# Patient Record
Sex: Male | Born: 1950 | Race: Black or African American | Hispanic: No | Smoking: Former smoker
Health system: Southern US, Community
[De-identification: ages and names within clinical notes are randomized; demographics above are authoritative.]

## PROBLEM LIST (undated history)

## (undated) DIAGNOSIS — E782 Mixed hyperlipidemia: Secondary | ICD-10-CM

## (undated) DIAGNOSIS — I10 Essential (primary) hypertension: Secondary | ICD-10-CM

## (undated) DIAGNOSIS — N529 Male erectile dysfunction, unspecified: Secondary | ICD-10-CM

## (undated) DIAGNOSIS — N183 Chronic kidney disease, stage 3 unspecified: Secondary | ICD-10-CM

## (undated) DIAGNOSIS — I2510 Atherosclerotic heart disease of native coronary artery without angina pectoris: Secondary | ICD-10-CM

## (undated) DIAGNOSIS — E559 Vitamin D deficiency, unspecified: Secondary | ICD-10-CM

## (undated) DIAGNOSIS — E538 Deficiency of other specified B group vitamins: Secondary | ICD-10-CM

## (undated) DIAGNOSIS — M1990 Unspecified osteoarthritis, unspecified site: Secondary | ICD-10-CM

## (undated) DIAGNOSIS — M109 Gout, unspecified: Secondary | ICD-10-CM

## (undated) HISTORY — DX: Atherosclerotic heart disease of native coronary artery without angina pectoris: I25.10

## (undated) HISTORY — DX: Unspecified osteoarthritis, unspecified site: M19.90

## (undated) HISTORY — DX: Chronic kidney disease, stage 3 unspecified: N18.30

## (undated) HISTORY — DX: Male erectile dysfunction, unspecified: N52.9

## (undated) HISTORY — DX: Deficiency of other specified B group vitamins: E53.8

## (undated) HISTORY — DX: Mixed hyperlipidemia: E78.2

## (undated) HISTORY — PX: ROOT CANAL: SHX2363

## (undated) HISTORY — PX: NO PAST SURGERIES: SHX2092

---

## 2005-06-27 ENCOUNTER — Ambulatory Visit: Payer: Self-pay

## 2005-07-26 ENCOUNTER — Ambulatory Visit: Payer: Self-pay

## 2006-07-22 ENCOUNTER — Ambulatory Visit: Payer: Self-pay | Admitting: Family Medicine

## 2013-06-09 ENCOUNTER — Ambulatory Visit: Payer: Self-pay

## 2013-12-15 ENCOUNTER — Ambulatory Visit: Payer: Self-pay | Admitting: Family Medicine

## 2015-01-28 ENCOUNTER — Ambulatory Visit: Payer: Self-pay | Admitting: Family Medicine

## 2015-02-15 ENCOUNTER — Ambulatory Visit
Admit: 2015-02-15 | Disposition: A | Discharge status: Home / Self Care | Payer: Self-pay | Attending: Family Medicine | Admitting: Family Medicine

## 2015-03-18 ENCOUNTER — Ambulatory Visit
Admit: 2015-03-18 | Disposition: A | Discharge status: Home / Self Care | Payer: Self-pay | Attending: Family Medicine | Admitting: Family Medicine

## 2015-05-11 ENCOUNTER — Other Ambulatory Visit: Payer: Self-pay | Admitting: Family Medicine

## 2015-05-11 ENCOUNTER — Ambulatory Visit
Admission: RE | Admit: 2015-05-11 | Discharge: 2015-05-11 | Disposition: A | Discharge status: Home / Self Care | Payer: 59 | Source: Ambulatory Visit | Attending: Family Medicine | Admitting: Family Medicine

## 2015-05-11 DIAGNOSIS — M5136 Other intervertebral disc degeneration, lumbar region: Secondary | ICD-10-CM | POA: Insufficient documentation

## 2015-05-11 DIAGNOSIS — M6281 Muscle weakness (generalized): Secondary | ICD-10-CM

## 2015-05-12 ENCOUNTER — Other Ambulatory Visit: Payer: Self-pay | Admitting: Family Medicine

## 2015-05-12 DIAGNOSIS — R270 Ataxia, unspecified: Secondary | ICD-10-CM

## 2015-05-28 ENCOUNTER — Other Ambulatory Visit: Payer: 59

## 2015-06-07 ENCOUNTER — Ambulatory Visit
Admission: EM | Admit: 2015-06-07 | Discharge: 2015-06-07 | Disposition: A | Discharge status: Home / Self Care | Payer: 59 | Attending: Emergency Medicine | Admitting: Emergency Medicine

## 2015-06-07 ENCOUNTER — Encounter: Payer: Self-pay | Admitting: Emergency Medicine

## 2015-06-07 DIAGNOSIS — M1000 Idiopathic gout, unspecified site: Secondary | ICD-10-CM

## 2015-06-07 DIAGNOSIS — M109 Gout, unspecified: Secondary | ICD-10-CM

## 2015-06-07 HISTORY — DX: Essential (primary) hypertension: I10

## 2015-06-07 HISTORY — DX: Gout, unspecified: M10.9

## 2015-06-07 MED ORDER — NAPROXEN 500 MG PO TABS: 500.0000 mg | ORAL_TABLET | Freq: Two times a day (BID) | ORAL | Status: DC

## 2015-06-07 NOTE — Discharge Instructions (Signed)

## 2015-06-07 NOTE — ED Provider Notes (Signed)
CSN: [id number]     Arrival date & time 06/07/15  6606 History   None    Chief Complaint  Patient presents with  . Toe Pain   (Consider location/radiation/quality/duration/timing/severity/associated sxs/prior Treatment) HPI  This is a 64 year old male with a history of previous gout attack who presents today with a hot tender swollen left great toe. He states that on Sunday at 2 AM he was awakened by pain A proceed mostly in his ankle but that has since settled in his MTP of his hallux. He started taking Advil but this is not stopped the attack. He admits to drinking 2 beers daily but has not been eating excessive amounts of shell fish or meat. His pain is so severe that he has a very difficult time putting on his shoes. He is not on any preventative medication.  Past Medical History  Diagnosis Date  . Hypertension   . Gout    History reviewed. No pertinent past surgical history. History reviewed. No pertinent family history. History  Substance Use Topics  . Smoking status: Former Research scientist (life sciences)  . Smokeless tobacco: Never Used  . Alcohol Use: Yes    Review of Systems  Unable to perform ROS Musculoskeletal: Positive for joint swelling and arthralgias.    Allergies  Review of patient's allergies indicates no known allergies.  Home Medications   Prior to Admission medications   Medication Sig Start Date End Date Taking? Authorizing Provider  Candesartan Cilexetil-HCTZ 32-25 MG TABS Take 1 tablet by mouth once.   Yes Historical Provider, MD  naproxen (NAPROSYN) 500 MG tablet Take 1 tablet (500 mg total) by mouth 2 (two) times daily with a meal. 06/07/15   Lorin Picket, PA-C   BP 136/65 mmHg  Pulse 82  Temp(Src) 97 F (36.1 C) (Tympanic)  Resp 16  Ht 5\' 7"  (1.702 m)  Wt 210 lb (95.255 kg)  BMI 32.88 kg/m2  SpO2 95% Physical Exam  Constitutional: He is oriented to person, place, and time. He appears well-developed and well-nourished.  HENT:  Head: Normocephalic and  atraumatic.  Eyes: EOM are normal. Pupils are equal, round, and reactive to light.  Musculoskeletal:  Examination of his left foot shows a swollen erythematous warm tender first MTP. Any pressure tends to cause his pain to increase. The ankle does not seem to be involved today. He has a fairly good range of motion of the ankle. He has pain with any motion of the first MTP.  Neurological: He is alert and oriented to person, place, and time. He has normal reflexes.  Skin: Skin is warm and dry.  Psychiatric: He has a normal mood and affect. His behavior is normal. Judgment and thought content normal.  Nursing note and vitals reviewed.   ED Course  Procedures (including critical care time) Labs Review Labs Reviewed - No data to display  Imaging Review No results found.   MDM   1. Gout of big toe    Discharge Medication List as of 06/07/2015  7:58 AM    START taking these medications   Details  naproxen (NAPROSYN) 500 MG tablet Take 1 tablet (500 mg total) by mouth 2 (two) times daily with a meal., Starting 06/07/2015, Until Discontinued, Print      Plan: 1. Diagnosis reviewed with patient 2. rx as per orders; risks, benefits, potential side effects reviewed with patient 3. Recommend supportive treatment with rest use prilosec while taking naprosyn 4. F/u prn if symptoms worsen or don't improve,with Dr. Clide Dales 5.  Continue taking naprosyn until symptom free for 2 days then D/C.    Lorin Picket, PA-C 06/07/15 [phone]

## 2015-06-07 NOTE — ED Notes (Signed)
Patient c/o pain in his left 1st toe since Sunday.

## 2015-06-09 ENCOUNTER — Ambulatory Visit (INDEPENDENT_AMBULATORY_CARE_PROVIDER_SITE_OTHER): Payer: 59 | Admitting: Family Medicine

## 2015-06-09 ENCOUNTER — Telehealth: Payer: Self-pay | Admitting: Family Medicine

## 2015-06-09 ENCOUNTER — Encounter: Payer: Self-pay | Admitting: Family Medicine

## 2015-06-09 VITALS — BP 130/82 | HR 79 | Temp 98.6°F | Resp 18 | Ht 67.0 in | Wt 210.0 lb

## 2015-06-09 DIAGNOSIS — M1000 Idiopathic gout, unspecified site: Secondary | ICD-10-CM | POA: Diagnosis not present

## 2015-06-09 MED ORDER — HYDROCODONE-ACETAMINOPHEN 10-325 MG PO TABS: 1.0000 | ORAL_TABLET | Freq: Three times a day (TID) | ORAL | Status: DC | PRN

## 2015-06-09 MED ORDER — PREDNISONE 20 MG PO TABS: 20.0000 mg | ORAL_TABLET | Freq: Every day | ORAL | Status: DC

## 2015-06-09 NOTE — Patient Instructions (Signed)
1 mo 

## 2015-06-09 NOTE — Progress Notes (Signed)
Name: Fernando Hart   MRN: [id number]    DOB: [birth year]   Date:06/09/2015       Progress Note  Subjective  Chief Complaint  Chief Complaint  Patient presents with  . Gout    Transiton into care from [facility]. Seen 06/07/2015. Was given Naproxen 500 mg twice a day    HPI  Gout   Past Medical History  Diagnosis Date  . Hypertension   . Gout     History  Substance Use Topics  . Smoking status: Former Research scientist (life sciences)  . Smokeless tobacco: Never Used  . Alcohol Use: 0.0 oz/week    0 Standard drinks or equivalent per week     Current outpatient prescriptions:  .  Candesartan Cilexetil-HCTZ 32-25 MG TABS, Take 1 tablet by mouth once., Disp: , Rfl:  .  naproxen (NAPROSYN) 500 MG tablet, Take 1 tablet (500 mg total) by mouth 2 (two) times daily with a meal., Disp: 60 tablet, Rfl: 0  No Known Allergies  Review of Systems  Constitutional: Negative for fever, chills and weight loss.  HENT: Negative for congestion, hearing loss, sore throat and tinnitus.   Eyes: Negative for blurred vision, double vision and redness.  Respiratory: Negative for cough, hemoptysis and shortness of breath.   Cardiovascular: Negative for chest pain, palpitations, orthopnea, claudication and leg swelling.  Gastrointestinal: Negative for heartburn, nausea, vomiting, diarrhea, constipation and blood in stool.  Genitourinary: Negative for dysuria, urgency, frequency and hematuria.  Musculoskeletal: Positive for joint pain (Left great toe and left ankle). Negative for myalgias, back pain, falls and neck pain.  Skin: Negative for itching.  Neurological: Negative for dizziness, tingling, tremors, focal weakness, seizures, loss of consciousness, weakness and headaches.  Endo/Heme/Allergies: Does not bruise/bleed easily.  Psychiatric/Behavioral: Negative for depression and substance abuse. The patient is not nervous/anxious and does not have insomnia.      Objective  Filed Vitals:   06/09/15 1039  BP:  130/82  Pulse: 79  Temp: 98.6 F (37 C)  TempSrc: Oral  Resp: 18  Height: 5\' 7"  (1.702 m)  Weight: 210 lb (95.255 kg)  SpO2: 96%     Physical Exam  Musculoskeletal: He exhibits edema (left great toe and left ankle) and tenderness.      Assessment & Plan  1. Idiopathic gout, unspecified chronicity, unspecified site Severe - Uric acid - predniSONE (DELTASONE) 20 MG tablet; Take 1 tablet (20 mg total) by mouth daily with breakfast.  Dispense: 10 tablet; Refill: 0 - HYDROcodone-acetaminophen (NORCO) 10-325 MG per tablet; Take 1 tablet by mouth every 8 (eight) hours as needed.  Dispense: 30 tablet; Refill: 0

## 2015-06-09 NOTE — Telephone Encounter (Signed)
Patient says his prescription is not at [facility]. Please call when this has been completed. [phone]

## 2015-06-10 NOTE — Telephone Encounter (Signed)
Script called to [facility]

## 2015-06-18 ENCOUNTER — Other Ambulatory Visit: Payer: 59

## 2015-06-27 ENCOUNTER — Other Ambulatory Visit: Payer: Self-pay | Admitting: Family Medicine

## 2015-06-30 ENCOUNTER — Encounter: Payer: Self-pay | Admitting: Family Medicine

## 2015-07-01 ENCOUNTER — Inpatient Hospital Stay
Admission: RE | Admit: 2015-07-01 | Discharge: 2015-07-01 | Disposition: A | Discharge status: Home / Self Care | Payer: 59 | Source: Ambulatory Visit | Attending: Family Medicine | Admitting: Family Medicine

## 2015-07-09 ENCOUNTER — Ambulatory Visit
Admission: RE | Admit: 2015-07-09 | Discharge: 2015-07-09 | Disposition: A | Discharge status: Home / Self Care | Payer: 59 | Source: Ambulatory Visit | Attending: Family Medicine | Admitting: Family Medicine

## 2015-07-09 DIAGNOSIS — R270 Ataxia, unspecified: Secondary | ICD-10-CM

## 2015-07-09 MED ORDER — GADOBENATE DIMEGLUMINE 529 MG/ML IV SOLN
20.0000 mL | Freq: Once | INTRAVENOUS | Status: AC | PRN
  Administered 2015-07-09: 20 mL via INTRAVENOUS

## 2015-07-11 ENCOUNTER — Encounter: Payer: Self-pay | Admitting: Family Medicine

## 2015-07-12 ENCOUNTER — Ambulatory Visit: Payer: 59 | Admitting: Family Medicine

## 2015-07-19 ENCOUNTER — Ambulatory Visit (INDEPENDENT_AMBULATORY_CARE_PROVIDER_SITE_OTHER): Payer: 59 | Admitting: Family Medicine

## 2015-07-19 ENCOUNTER — Encounter: Payer: Self-pay | Admitting: Family Medicine

## 2015-07-19 VITALS — BP 140/80 | HR 91 | Temp 98.0°F | Resp 18 | Ht 67.0 in | Wt 211.6 lb

## 2015-07-19 DIAGNOSIS — G4733 Obstructive sleep apnea (adult) (pediatric): Secondary | ICD-10-CM | POA: Diagnosis not present

## 2015-07-19 DIAGNOSIS — R739 Hyperglycemia, unspecified: Secondary | ICD-10-CM | POA: Diagnosis not present

## 2015-07-19 DIAGNOSIS — M109 Gout, unspecified: Secondary | ICD-10-CM | POA: Diagnosis not present

## 2015-07-19 LAB — POCT GLYCOSYLATED HEMOGLOBIN (HGB A1C): Hemoglobin A1C: 6

## 2015-07-19 LAB — GLUCOSE, POCT (MANUAL RESULT ENTRY): POC GLUCOSE: 115 mg/dL — AB (ref 70–99)

## 2015-07-19 NOTE — Patient Instructions (Signed)

## 2015-07-19 NOTE — Progress Notes (Signed)
Name: Fernando Hart   MRN: [id number]    DOB: 19-Nov-1951   Date:07/19/2015       Progress Note  Subjective  Chief Complaint  Chief Complaint  Patient presents with  . Gout    follow up, symptoms resolved    HPI  Gout  Patient states it took about for 5 days before his gout cleared with medication. He's had no recurrence since that. He has not had frequent recurrences over the years.  Ataxia  Patient's been seen by neurologist. Extensive workup including MRI been done. MRI shows age-related  ischemic changes. He has not had a recurrence of any significance. He's been informed that he need not return to neurologist Alyssa has recurrent further workup included MRI of the 6 spine will be performed at bedtime  Hyperlipidemia  Patient has a history of elevated glucose in the past. Hemoglobin A1c has been as high as 6.2. He is currently not complaining of any polyuria polydipsia polyphagia. Next problem probable sleep apnea subjective patient is had a sleep study done in the past and was diagnosed with sleep apnea but has refused CPAP.  Past Medical History  Diagnosis Date  . Hypertension   . Gout     History  Substance Use Topics  . Smoking status: Former Research scientist (life sciences)  . Smokeless tobacco: Never Used  . Alcohol Use: 0.0 oz/week    0 Standard drinks or equivalent per week     Current outpatient prescriptions:  .  Candesartan Cilexetil-HCTZ 32-25 MG TABS, Take 1 tablet by mouth  daily, Disp: 90 each, Rfl: 1 .  HYDROcodone-acetaminophen (NORCO) 10-325 MG per tablet, Take 1 tablet by mouth every 8 (eight) hours as needed., Disp: 30 tablet, Rfl: 0 .  naproxen (NAPROSYN) 500 MG tablet, Take 1 tablet (500 mg total) by mouth 2 (two) times daily with a meal., Disp: 60 tablet, Rfl: 0 .  predniSONE (DELTASONE) 20 MG tablet, Take 1 tablet (20 mg total) by mouth daily with breakfast., Disp: 10 tablet, Rfl: 0  No Known Allergies  Review of Systems  Constitutional: Negative for fever, chills  and weight loss.  HENT: Negative for congestion, hearing loss, sore throat and tinnitus.   Eyes: Negative for blurred vision, double vision and redness.  Respiratory: Negative for cough, hemoptysis and shortness of breath.   Cardiovascular: Negative for chest pain, palpitations, orthopnea, claudication and leg swelling.  Gastrointestinal: Negative for heartburn, nausea, vomiting, diarrhea, constipation and blood in stool.  Genitourinary: Negative for dysuria, urgency, frequency and hematuria.  Musculoskeletal: Positive for joint pain. Negative for myalgias, back pain, falls and neck pain.  Skin: Negative for itching.  Neurological: Negative for dizziness, tingling, tremors, focal weakness, seizures, loss of consciousness, weakness and headaches.       Ataxia and occasional falls  Endo/Heme/Allergies: Does not bruise/bleed easily.  Psychiatric/Behavioral: Negative for depression and substance abuse. The patient is not nervous/anxious and does not have insomnia.      Objective  Filed Vitals:   07/19/15 0827  BP: 140/80  Pulse: 91  Temp: 98 F (36.7 C)  TempSrc: Oral  Resp: 18  Height: 5\' 7"  (1.702 m)  Weight: 211 lb 9.6 oz (95.981 kg)  SpO2: 95%     Physical Exam    Assessment & Plan  1. Hyperglycemia  - POCT HgB A1C - POCT Glucose (CBG)  2. OSA (obstructive sleep apnea) CPAP  3. Gout of right foot, unspecified cause, unspecified chronicity  - Uric acid TO DETERMINE IF URICOSURIC IS NEEDED

## 2015-08-06 LAB — URIC ACID: Uric Acid: 9 mg/dL — ABNORMAL HIGH (ref 3.7–8.6)

## 2015-08-10 ENCOUNTER — Telehealth: Payer: Self-pay | Admitting: Emergency Medicine

## 2015-08-10 MED ORDER — ALLOPURINOL 100 MG PO TABS: 100.0000 mg | ORAL_TABLET | Freq: Every day | ORAL | Status: DC

## 2015-08-10 NOTE — Telephone Encounter (Signed)
Letter with script mailed to patient. Unable to reach by phone at either number on file.

## 2015-09-08 ENCOUNTER — Encounter: Payer: Self-pay | Admitting: Family Medicine

## 2015-10-05 ENCOUNTER — Ambulatory Visit (INDEPENDENT_AMBULATORY_CARE_PROVIDER_SITE_OTHER): Payer: 59 | Admitting: Family Medicine

## 2015-10-05 ENCOUNTER — Encounter: Payer: Self-pay | Admitting: Family Medicine

## 2015-10-05 VITALS — BP 122/76 | HR 96 | Temp 98.5°F | Resp 16 | Ht 67.0 in | Wt 210.1 lb

## 2015-10-05 DIAGNOSIS — Z0000 Encounter for general adult medical examination without abnormal findings: Secondary | ICD-10-CM

## 2015-10-05 DIAGNOSIS — Z1211 Encounter for screening for malignant neoplasm of colon: Secondary | ICD-10-CM | POA: Diagnosis not present

## 2015-10-05 DIAGNOSIS — Z125 Encounter for screening for malignant neoplasm of prostate: Secondary | ICD-10-CM | POA: Diagnosis not present

## 2015-10-05 NOTE — Progress Notes (Signed)
Name: Fernando Hart   MRN: [id number]    DOB: 06-21-1951   Date:10/05/2015       Progress Note  Subjective This is membranes B lymphoblastic    Chief Complaint  Chief Complaint  Patient presents with  . Annual Exam    HPI  Patient presents for annual H&P. His baseline medical problems are stable.    Past Medical History  Diagnosis Date  . Hypertension   . Gout     Social History  Substance Use Topics  . Smoking status: Former Research scientist (life sciences)  . Smokeless tobacco: Former Systems developer    Quit date: 05/06/2015  . Alcohol Use: 0.0 oz/week    0 Standard drinks or equivalent per week     Current outpatient prescriptions:  .  allopurinol (ZYLOPRIM) 100 MG tablet, Take 1 tablet (100 mg total) by mouth daily., Disp: 60 tablet, Rfl: 5 .  Candesartan Cilexetil-HCTZ 32-25 MG TABS, Take 1 tablet by mouth  daily, Disp: 90 each, Rfl: 1 .  HYDROcodone-acetaminophen (NORCO) 10-325 MG per tablet, Take 1 tablet by mouth every 8 (eight) hours as needed., Disp: 30 tablet, Rfl: 0 .  naproxen (NAPROSYN) 500 MG tablet, Take 1 tablet (500 mg total) by mouth 2 (two) times daily with a meal., Disp: 60 tablet, Rfl: 0 .  predniSONE (DELTASONE) 20 MG tablet, Take 1 tablet (20 mg total) by mouth daily with breakfast. (Patient not taking: Reported on 10/05/2015), Disp: 10 tablet, Rfl: 0  No Known Allergies  Review of Systems  Constitutional: Negative for fever, chills and weight loss.       Obese male in no acute distress  HENT: Negative for congestion, hearing loss, sore throat and tinnitus.   Eyes: Negative for blurred vision, double vision and redness.  Respiratory: Negative for cough, hemoptysis and shortness of breath.   Cardiovascular: Negative for chest pain, palpitations, orthopnea, claudication and leg swelling.  Gastrointestinal: Negative for heartburn, nausea, vomiting, diarrhea, constipation and blood in stool.  Genitourinary: Negative for dysuria, urgency, frequency and hematuria.   Musculoskeletal: Negative for myalgias, back pain, joint pain, falls and neck pain.  Skin: Negative for itching.  Neurological: Negative for dizziness, tingling, tremors, focal weakness, seizures, loss of consciousness, weakness and headaches.  Endo/Heme/Allergies: Does not bruise/bleed easily.  Psychiatric/Behavioral: Negative for depression and substance abuse. The patient is not nervous/anxious and does not have insomnia.      Objective  Filed Vitals:   10/05/15 0933  BP: 122/76  Pulse: 96  Temp: 98.5 F (36.9 C)  Resp: 16  Height: 5\' 7"  (1.702 m)  Weight: 210 lb 1 oz (95.284 kg)  SpO2: 98%     Physical Exam  Constitutional: He is oriented to person, place, and time and well-developed, well-nourished, and in no distress.  HENT:  Head: Normocephalic.  Eyes: EOM are normal. Pupils are equal, round, and reactive to light.  Neck: Normal range of motion. Neck supple. No thyromegaly present.  Cardiovascular: Normal rate, regular rhythm and normal heart sounds.   No murmur heard. Pulmonary/Chest: Effort normal and breath sounds normal. No respiratory distress. He has no wheezes.  Abdominal: Soft. Bowel sounds are normal.  Genitourinary: Rectum normal, prostate normal and penis normal. Guaiac negative stool. No discharge found.  Musculoskeletal: Normal range of motion. He exhibits no edema.  Lymphadenopathy:    He has no cervical adenopathy.  Neurological: He is alert and oriented to person, place, and time. No cranial nerve deficit. Gait normal. Coordination normal.  Skin: Skin is warm and dry.  No rash noted.  Psychiatric: Affect and judgment normal.      Assessment & Plan   1. Annual physical exam CBC CMP and PSA and lipid panel  2. Colon cancer screening Negative morning - POC Hemoccult Bld/Stl (1-Cd Office Dx)    Asked to see because symptoms of her symptoms 6 to these should she reports receptors were still having trouble about her fall

## 2015-10-05 NOTE — Patient Instructions (Signed)
Acanthosis Nigricans Acanthosis nigricans is a disorder in which dark, velvety markings appear on the skin. CAUSES This condition may be caused by:  A hormonal or glandular disorder, such as diabetes.  Obesity.  Certain medicines, such as birth control pills.  A tumor. (This is rare.) Some people inherit the condition from their parents. RISK FACTORS This condition is more likely to develop in:  People who have a hormonal or glandular disorder.  People who are overweight.  People who take certain medicines.  People who have certain cancers, especially stomach cancer.  People who have dark-colored skin (dark complexion). SYMPTOMS The main symptom of this condition is velvety markings on the skin that are light brown, black, or grayish in color. The markings usually appear on the face, neck, armpits, inner thighs, and groin. In severe cases, markings may also appear on the lips, hands, breasts, eyelids, and mouth. DIAGNOSIS This condition may be diagnosed based on symptoms. Sometimes, a skin sample is taken for testing (skin biopsy). You may also have tests to help determine the cause of the condition. TREATMENT Treatment for this condition depends on the cause. Treatment may involve reducing insulin levels, which are often high in people who have this condition. Insulin levels can be reduced with:  Dietary changes, such as avoiding starchy foods and sugars.  Losing weight.  Medicines. Sometimes, treatment involves:  Medicines to improve the appearance of the skin.  Laser treatment to improve the appearance of the skin.  Surgical removal of the skin markings (dermabrasion). HOME CARE INSTRUCTIONS  Follow diet instructions from your health care provider.  Lose weight if you are overweight.  Take over-the-counter and prescription medicines only as told by your health care provider.  Keep all follow-up visits as told by your health care provider. This is  important. SEEK MEDICAL CARE IF:  The skin markings do not go away with treatment.  New skin markings develop on a part of the body where they rarely develop, such as on your lips, hands, breasts, eyelids, or mouth.  The condition recurs for an unknown reason.   This information is not intended to replace advice given to you by your health care provider. Make sure you discuss any questions you have with your health care provider.   Document Released: 12/03/2005 Document Revised: 08/24/2015 Document Reviewed: 01/27/2015 Elsevier Interactive Patient Education [employer].

## 2015-12-17 ENCOUNTER — Other Ambulatory Visit: Payer: Self-pay | Admitting: Family Medicine

## 2016-01-07 LAB — COMPREHENSIVE METABOLIC PANEL
ALT: 28 IU/L (ref 0–44)
AST: 32 IU/L (ref 0–40)
Albumin/Globulin Ratio: 1.5 (ref 1.1–2.5)
Albumin: 4.3 g/dL (ref 3.6–4.8)
Alkaline Phosphatase: 63 IU/L (ref 39–117)
BUN/Creatinine Ratio: 13 (ref 10–22)
BUN: 16 mg/dL (ref 8–27)
Bilirubin Total: 0.4 mg/dL (ref 0.0–1.2)
CALCIUM: 9.8 mg/dL (ref 8.6–10.2)
CHLORIDE: 97 mmol/L (ref 96–106)
CO2: 25 mmol/L (ref 18–29)
CREATININE: 1.24 mg/dL (ref 0.76–1.27)
GFR calc Af Amer: 70 mL/min/{1.73_m2} (ref >59)
GFR calc non Af Amer: 61 mL/min/{1.73_m2} (ref >59)
GLUCOSE: 100 mg/dL — AB (ref 65–99)
Globulin, Total: 2.8 g/dL (ref 1.5–4.5)
Potassium: 3.9 mmol/L (ref 3.5–5.2)
Sodium: 138 mmol/L (ref 134–144)
Total Protein: 7.1 g/dL (ref 6.0–8.5)

## 2016-01-07 LAB — LIPID PANEL
Chol/HDL Ratio: 5.3 ratio units — ABNORMAL HIGH (ref 0.0–5.0)
Cholesterol, Total: 153 mg/dL (ref 100–199)
HDL: 29 mg/dL — ABNORMAL LOW (ref >39)
LDL CALC: 96 mg/dL (ref 0–99)
Triglycerides: 140 mg/dL (ref 0–149)
VLDL CHOLESTEROL CAL: 28 mg/dL (ref 5–40)

## 2016-01-07 LAB — CBC
Hematocrit: 47.6 % (ref 37.5–51.0)
Hemoglobin: 16.3 g/dL (ref 12.6–17.7)
MCH: 28.8 pg (ref 26.6–33.0)
MCHC: 34.2 g/dL (ref 31.5–35.7)
MCV: 84 fL (ref 79–97)
Platelets: 194 10*3/uL (ref 150–379)
RBC: 5.65 x10E6/uL (ref 4.14–5.80)
RDW: 13.6 % (ref 12.3–15.4)
WBC: 5.8 10*3/uL (ref 3.4–10.8)

## 2016-01-07 LAB — PSA: PROSTATE SPECIFIC AG, SERUM: 1 ng/mL (ref 0.0–4.0)

## 2016-01-07 LAB — TSH: TSH: 1.92 u[IU]/mL (ref 0.450–4.500)

## 2016-01-09 ENCOUNTER — Telehealth: Payer: Self-pay | Admitting: Emergency Medicine

## 2016-01-09 NOTE — Telephone Encounter (Signed)
Letter sent to patient regarding labs

## 2016-01-10 ENCOUNTER — Ambulatory Visit (INDEPENDENT_AMBULATORY_CARE_PROVIDER_SITE_OTHER): Payer: 59 | Admitting: Family Medicine

## 2016-01-10 ENCOUNTER — Encounter: Payer: Self-pay | Admitting: Family Medicine

## 2016-01-10 VITALS — BP 132/62 | HR 86 | Temp 98.5°F | Resp 18 | Ht 67.0 in | Wt 212.4 lb

## 2016-01-10 DIAGNOSIS — I10 Essential (primary) hypertension: Secondary | ICD-10-CM

## 2016-01-10 DIAGNOSIS — M109 Gout, unspecified: Secondary | ICD-10-CM | POA: Insufficient documentation

## 2016-01-10 DIAGNOSIS — R7303 Prediabetes: Secondary | ICD-10-CM | POA: Insufficient documentation

## 2016-01-10 DIAGNOSIS — R739 Hyperglycemia, unspecified: Secondary | ICD-10-CM

## 2016-01-10 DIAGNOSIS — M1000 Idiopathic gout, unspecified site: Secondary | ICD-10-CM

## 2016-01-10 LAB — GLUCOSE, POCT (MANUAL RESULT ENTRY): POC Glucose: 106 mg/dl — AB (ref 70–99)

## 2016-01-10 LAB — POCT GLYCOSYLATED HEMOGLOBIN (HGB A1C): Hemoglobin A1C: 6.1

## 2016-01-10 MED ORDER — TRIAMCINOLONE 0.1 % CREAM:EUCERIN CREAM 1:1: 1.0000 "application " | TOPICAL_CREAM | Freq: Three times a day (TID) | CUTANEOUS | Status: DC

## 2016-01-10 NOTE — Progress Notes (Signed)
Name: Fernando Hart   MRN: [id number]    DOB: August 25, 1951   Date:01/10/2016       Progress Note  Subjective  Chief Complaint  Chief Complaint  Patient presents with  . Hypertension    pt here for 3 month follow up  . Gout    HPI  Hypertension   Patient presents for follow-up of hypertension. It has been present for over 5 and losartan years.  Patient states that there is compliance with medical regimen which consists of  . thank you candesartan HCTZ 32-25 once daily.There is no end organ disease. Cardiac risk factors include hypertension hyperlipidemia and diabetes.  Exercise regimen consist of take anything this morning his pain is adequate pain medicines as last night so regular cardiology .  Diet consist of low-sodium .  Gout history of present illness  Patient has a long-standing history of gout. He is currently on allopurinol 100 mg, daily. Is taking Naprosyn and HAS FOR FLARES OF HIS GOUT.  Hyperglycemia  Patient has a history of hyperglycemia with last A1c being 6.0 August 16 glucose being 1:15. A complete complaint of any polyuria polydipsia polyphagia. Past Medical History  Diagnosis Date  . Hypertension   . Gout     Social History  Substance Use Topics  . Smoking status: Former Research scientist (life sciences)  . Smokeless tobacco: Former Systems developer    Quit date: 05/06/2015  . Alcohol Use: 0.0 oz/week    0 Standard drinks or equivalent per week     Current outpatient prescriptions:  .  allopurinol (ZYLOPRIM) 100 MG tablet, Take 1 tablet (100 mg total) by mouth daily., Disp: 60 tablet, Rfl: 5 .  Candesartan Cilexetil-HCTZ 32-25 MG TABS, Take 1 tablet by mouth  daily, Disp: 90 each, Rfl: 1 .  HYDROcodone-acetaminophen (NORCO) 10-325 MG per tablet, Take 1 tablet by mouth every 8 (eight) hours as needed., Disp: 30 tablet, Rfl: 0 .  naproxen (NAPROSYN) 500 MG tablet, Take 1 tablet (500 mg total) by mouth 2 (two) times daily with a meal., Disp: 60 tablet, Rfl: 0 .  predniSONE (DELTASONE) 20 MG  tablet, Take 1 tablet (20 mg total) by mouth daily with breakfast. (Patient not taking: Reported on 10/05/2015), Disp: 10 tablet, Rfl: 0  No Known Allergies  Review of Systems  Constitutional: Negative for fever, chills and weight loss.  HENT: Negative for congestion, hearing loss, sore throat and tinnitus.   Eyes: Negative for blurred vision, double vision and redness.  Respiratory: Negative for cough, hemoptysis and shortness of breath.   Cardiovascular: Negative for chest pain, palpitations, orthopnea, claudication and leg swelling.  Gastrointestinal: Negative for heartburn, nausea, vomiting, diarrhea, constipation and blood in stool.  Genitourinary: Negative for dysuria, urgency, frequency and hematuria.  Musculoskeletal: Positive for joint pain. Negative for myalgias, back pain, falls and neck pain.  Skin: Negative for itching.  Neurological: Negative for dizziness, tingling, tremors, focal weakness, seizures, loss of consciousness, weakness and headaches.  Endo/Heme/Allergies: Does not bruise/bleed easily.  Psychiatric/Behavioral: Negative for depression and substance abuse. The patient is not nervous/anxious and does not have insomnia.      Objective  Filed Vitals:   01/10/16 0756  BP: 132/62  Pulse: 86  Temp: 98.5 F (36.9 C)  Resp: 18  Height: 5\' 7"  (1.702 m)  Weight: 212 lb 7 oz (96.361 kg)  SpO2: 97%     Physical Exam  Constitutional: He is oriented to person, place, and time and well-developed, well-nourished, and in no distress.  HENT:  Head: Normocephalic.  Eyes: EOM are normal. Pupils are equal, round, and reactive to light.  Neck: Normal range of motion. Neck supple. No thyromegaly present.  Cardiovascular: Normal rate, regular rhythm and normal heart sounds.   No murmur heard. Pulmonary/Chest: Effort normal and breath sounds normal. No respiratory distress. He has no wheezes.  Abdominal: Soft. Bowel sounds are normal.  Musculoskeletal: Normal range of  motion. He exhibits no edema.  Lymphadenopathy:    He has no cervical adenopathy.  Neurological: He is alert and oriented to person, place, and time. No cranial nerve deficit. Gait normal. Coordination normal.  Skin: Skin is warm and dry. No rash noted.  Psychiatric: Affect and judgment normal.      Assessment & Plan   1. Hyperglycemia Slightly worse with A1c of 6.1 encourage low-carb diet with lean protein and exercise regimen increase and work on weight decrease with exercise - POCT HgB A1C - POCT Glucose (CBG)  2. Essential hypertension Well-controlled  3. Idiopathic gout, unspecified chronicity, unspecified site Uric acid level

## 2016-01-12 ENCOUNTER — Telehealth: Payer: Self-pay | Admitting: Emergency Medicine

## 2016-01-12 ENCOUNTER — Telehealth: Payer: Self-pay | Admitting: Family Medicine

## 2016-01-12 NOTE — Telephone Encounter (Signed)
Left message for patient regarding lab results. Patient is to call back and confirm he received message and where to call refills into for increase on Allopurinol

## 2016-01-12 NOTE — Telephone Encounter (Signed)
Pt states you can send his RX to [facility]

## 2016-01-12 NOTE — Telephone Encounter (Signed)
Duplicate message. 

## 2016-01-13 ENCOUNTER — Other Ambulatory Visit: Payer: Self-pay | Admitting: Family Medicine

## 2016-01-13 MED ORDER — ALLOPURINOL 100 MG PO TABS: 100.0000 mg | ORAL_TABLET | Freq: Two times a day (BID) | ORAL | Status: DC

## 2016-01-17 LAB — URIC ACID: Uric Acid: 8.2 mg/dL (ref 3.7–8.6)

## 2016-01-17 LAB — SPECIMEN STATUS REPORT

## 2016-02-28 ENCOUNTER — Encounter: Payer: Self-pay | Admitting: Family Medicine

## 2016-02-28 ENCOUNTER — Ambulatory Visit (INDEPENDENT_AMBULATORY_CARE_PROVIDER_SITE_OTHER): Payer: 59 | Admitting: Family Medicine

## 2016-02-28 VITALS — BP 130/60 | HR 90 | Temp 97.7°F | Resp 17 | Ht 67.0 in | Wt 215.9 lb

## 2016-02-28 DIAGNOSIS — M1611 Unilateral primary osteoarthritis, right hip: Secondary | ICD-10-CM | POA: Insufficient documentation

## 2016-02-28 DIAGNOSIS — G8929 Other chronic pain: Secondary | ICD-10-CM

## 2016-02-28 DIAGNOSIS — M25551 Pain in right hip: Secondary | ICD-10-CM | POA: Diagnosis not present

## 2016-02-28 NOTE — Progress Notes (Signed)
Name: Fernando Hart   MRN: [id number]    DOB: 01/01/1951   Date:02/28/2016       Progress Note  Subjective  Chief Complaint  Chief Complaint  Patient presents with  . Acute Visit    Hip Pain    HPI  Right Hip Pain: Onset 6 months ago, progressively worse, no injury. Some movements make it worse (especially when he was exiting out of his car last week, had sudden sharp pain, to the point he almost fell and had to hold onto the door).  Past Medical History  Diagnosis Date  . Hypertension   . Gout     Past Surgical History  Procedure Laterality Date  . No past surgeries      Family History  Problem Relation Age of Onset  . Hypertension Mother   . Diabetes Father     Social History   Social History  . Marital Status: Single    Spouse Name: N/A  . Number of Children: N/A  . Years of Education: N/A   Occupational History  . Not on file.   Social History Main Topics  . Smoking status: Former Research scientist (life sciences)  . Smokeless tobacco: Former Systems developer    Quit date: 05/06/2015  . Alcohol Use: 0.0 oz/week    0 Standard drinks or equivalent per week  . Drug Use: No  . Sexual Activity:    Partners: Female   Other Topics Concern  . Not on file   Social History Narrative     Current outpatient prescriptions:  .  allopurinol (ZYLOPRIM) 100 MG tablet, Take 1 tablet (100 mg total) by mouth 2 (two) times daily., Disp: 180 tablet, Rfl: 1 .  Candesartan Cilexetil-HCTZ 32-25 MG TABS, Take 1 tablet by mouth  daily, Disp: 90 each, Rfl: 1 .  Triamcinolone Acetonide (TRIAMCINOLONE 0.1 % CREAM : EUCERIN) CREA, Apply 1 application topically 3 (three) times daily., Disp: 1 each, Rfl: 2  No Known Allergies   Review of Systems  Constitutional: Negative for fever and chills.  Musculoskeletal: Positive for joint pain. Negative for back pain.      Objective  Filed Vitals:   02/28/16 1531  BP: 130/60  Pulse: 90  Temp: 97.7 F (36.5 C)  TempSrc: Oral  Resp: 17  Height: 5\' 7"  (1.702 m)   Weight: 215 lb 14.4 oz (97.932 kg)  SpO2: 96%    Physical Exam  Musculoskeletal:       Right hip: He exhibits tenderness.  Tenderness to palpation over the right lateral hip (trochanteric bursa), no pain in the groin or the gluteal area. Muscle atrophy over the R. lateral hip area.  Nursing note and vitals reviewed.    Assessment & Plan  1. Chronic pain of right hip Persistent and slowly progressive. Suspect osteoarthritis. We'll obtain x-rays for evaluation. - DG HIP UNILAT WITH PELVIS 2-3 VIEWS RIGHT; Future   Netha Dafoe [facility] 02/28/2016 3:57 PM

## 2016-03-01 ENCOUNTER — Ambulatory Visit
Admission: RE | Admit: 2016-03-01 | Discharge: 2016-03-01 | Disposition: A | Discharge status: Home / Self Care | Payer: 59 | Source: Ambulatory Visit | Attending: Family Medicine | Admitting: Family Medicine

## 2016-03-01 DIAGNOSIS — M25551 Pain in right hip: Principal | ICD-10-CM

## 2016-03-01 DIAGNOSIS — G8929 Other chronic pain: Secondary | ICD-10-CM

## 2016-03-01 DIAGNOSIS — M160 Bilateral primary osteoarthritis of hip: Secondary | ICD-10-CM | POA: Insufficient documentation

## 2016-03-07 ENCOUNTER — Ambulatory Visit (INDEPENDENT_AMBULATORY_CARE_PROVIDER_SITE_OTHER): Payer: 59 | Admitting: Family Medicine

## 2016-03-07 ENCOUNTER — Encounter: Payer: Self-pay | Admitting: Family Medicine

## 2016-03-07 VITALS — BP 120/68 | HR 76 | Temp 98.3°F | Resp 18 | Ht 67.0 in | Wt 213.1 lb

## 2016-03-07 DIAGNOSIS — M1611 Unilateral primary osteoarthritis, right hip: Secondary | ICD-10-CM | POA: Diagnosis not present

## 2016-03-07 MED ORDER — MELOXICAM 15 MG PO TABS: 15.0000 mg | ORAL_TABLET | Freq: Every day | ORAL | Status: DC

## 2016-03-07 NOTE — Progress Notes (Signed)
Name: Fernando Hart   MRN: [id number]    DOB: 08-14-1951   Date:03/07/2016       Progress Note  Subjective  Chief Complaint  Chief Complaint  Patient presents with  . Hip Pain    follow up    HPI  Right Hip Pain: Progressively worsening right hip pain, X ray of right hip showed moderate degenerative changes, superior acetabular spurring.  Pt. Has been taking Aleve OTC BID, which helps relieve the pain to some extent. He is able to walk, which also helps relieve his pain.  Past Medical History  Diagnosis Date  . Hypertension   . Gout     Past Surgical History  Procedure Laterality Date  . No past surgeries      Family History  Problem Relation Age of Onset  . Hypertension Mother   . Diabetes Father     Social History   Social History  . Marital Status: Single    Spouse Name: N/A  . Number of Children: N/A  . Years of Education: N/A   Occupational History  . Not on file.   Social History Main Topics  . Smoking status: Former Research scientist (life sciences)  . Smokeless tobacco: Former Systems developer    Quit date: 05/06/2015  . Alcohol Use: 0.0 oz/week    0 Standard drinks or equivalent per week  . Drug Use: No  . Sexual Activity:    Partners: Female   Other Topics Concern  . Not on file   Social History Narrative     Current outpatient prescriptions:  .  allopurinol (ZYLOPRIM) 100 MG tablet, Take 1 tablet (100 mg total) by mouth 2 (two) times daily., Disp: 180 tablet, Rfl: 1 .  Candesartan Cilexetil-HCTZ 32-25 MG TABS, Take 1 tablet by mouth  daily, Disp: 90 each, Rfl: 1 .  Triamcinolone Acetonide (TRIAMCINOLONE 0.1 % CREAM : EUCERIN) CREA, Apply 1 application topically 3 (three) times daily., Disp: 1 each, Rfl: 2  No Known Allergies   Review of Systems  Musculoskeletal: Positive for joint pain.     Objective  Filed Vitals:   03/07/16 1027  BP: 120/68  Pulse: 76  Temp: 98.3 F (36.8 C)  TempSrc: Oral  Resp: 18  Height: 5\' 7"  (1.702 m)  Weight: 213 lb 1.6 oz (96.662  kg)  SpO2: 95%    Physical Exam  Constitutional: He is well-developed, well-nourished, and in no distress.  Cardiovascular: Normal rate and regular rhythm.   Pulmonary/Chest: Effort normal and breath sounds normal.  Musculoskeletal:       Right hip: He exhibits tenderness. He exhibits no swelling.       Legs: Tenderness to palpation over the lateral right hip, no tenderness in the groin area. No swelling in the trochanteric bursa.  Nursing note and vitals reviewed.   Assessment & Plan  1. Primary osteoarthritis of right hip We will start on meloxicam 15 mg daily for osteoarthritis. Encouraged to stay active. Follow-up in one month. - meloxicam (MOBIC) 15 MG tablet; Take 1 tablet (15 mg total) by mouth daily.  Dispense: 30 tablet; Refill: 0   Teagen Mcleary [facility] 03/07/2016 10:33 AM

## 2016-03-30 ENCOUNTER — Ambulatory Visit: Payer: 59 | Admitting: Family Medicine

## 2016-04-02 ENCOUNTER — Other Ambulatory Visit: Payer: Self-pay | Admitting: Family Medicine

## 2016-04-05 ENCOUNTER — Ambulatory Visit: Payer: 59 | Admitting: Family Medicine

## 2016-05-22 ENCOUNTER — Other Ambulatory Visit: Payer: Self-pay | Admitting: Family Medicine

## 2016-07-10 ENCOUNTER — Ambulatory Visit: Payer: 59 | Admitting: Family Medicine

## 2016-07-11 ENCOUNTER — Ambulatory Visit (INDEPENDENT_AMBULATORY_CARE_PROVIDER_SITE_OTHER): Payer: 59 | Admitting: Family Medicine

## 2016-07-11 ENCOUNTER — Encounter: Payer: Self-pay | Admitting: Family Medicine

## 2016-07-11 VITALS — BP 120/70 | HR 96 | Temp 98.8°F | Resp 18 | Ht 67.0 in | Wt 211.3 lb

## 2016-07-11 DIAGNOSIS — I10 Essential (primary) hypertension: Secondary | ICD-10-CM | POA: Diagnosis not present

## 2016-07-11 DIAGNOSIS — M1611 Unilateral primary osteoarthritis, right hip: Secondary | ICD-10-CM

## 2016-07-11 DIAGNOSIS — M79604 Pain in right leg: Secondary | ICD-10-CM | POA: Diagnosis not present

## 2016-07-11 MED ORDER — MELOXICAM 15 MG PO TABS: 15.0000 mg | ORAL_TABLET | Freq: Every day | ORAL | 0 refills | Status: DC

## 2016-07-11 MED ORDER — CANDESARTAN CILEXETIL-HCTZ 32-25 MG PO TABS: 1.0000 | ORAL_TABLET | Freq: Every day | ORAL | 1 refills | Status: DC

## 2016-07-11 MED ORDER — TIZANIDINE HCL 4 MG PO CAPS: 4.0000 mg | ORAL_CAPSULE | Freq: Every evening | ORAL | 0 refills | Status: DC | PRN

## 2016-07-11 NOTE — Progress Notes (Signed)
Name: Fernando Hart   MRN: [id number]    DOB: 15-May-1951   Date:07/11/2016       Progress Note  Subjective  Chief Complaint  Chief Complaint  Patient presents with  . Hypertension    medication refills  . Leg Pain    right leg pain    Hypertension  This is a chronic problem. The problem is unchanged. The problem is controlled. Pertinent negatives include no blurred vision, chest pain, headaches or malaise/fatigue. Past treatments include angiotensin blockers and diuretics. There is no history of kidney disease, CAD/MI or CVA.  Leg Pain   There was no injury mechanism. The pain is present in the right leg. The quality of the pain is described as burning and aching. The pain is at a severity of 4/10. The pain is moderate. The pain has been improving since onset. He has tried NSAIDs for the symptoms. The treatment provided no relief.  Pain started after he made a trip with his grandchildren to [place] after the Fourth of July weekend, took some rides and had to walk more than usual. Since then, pain is present intermittently, feels tightness in his muscles.   Past Medical History:  Diagnosis Date  . Gout   . Hypertension     Past Surgical History:  Procedure Laterality Date  . NO PAST SURGERIES      Family History  Problem Relation Age of Onset  . Hypertension Mother   . Diabetes Father     Social History   Social History  . Marital status: Single    Spouse name: N/A  . Number of children: N/A  . Years of education: N/A   Occupational History  . Not on file.   Social History Main Topics  . Smoking status: Former Research scientist (life sciences)  . Smokeless tobacco: Former Systems developer    Quit date: 05/06/2015  . Alcohol use 0.0 oz/week  . Drug use: No  . Sexual activity: Yes    Partners: Female   Other Topics Concern  . Not on file   Social History Narrative  . No narrative on file     Current Outpatient Prescriptions:  .  allopurinol (ZYLOPRIM) 100 MG tablet, Take 1 tablet (100 mg  total) by mouth 2 (two) times daily., Disp: 180 tablet, Rfl: 1 .  Candesartan Cilexetil-HCTZ 32-25 MG TABS, Take 1 tablet by mouth  daily, Disp: 30 each, Rfl: 1 .  meloxicam (MOBIC) 15 MG tablet, TAKE 1 TABLET BY MOUTH EVERY DAY, Disp: 30 tablet, Rfl: 0 .  Triamcinolone Acetonide (TRIAMCINOLONE 0.1 % CREAM : EUCERIN) CREA, Apply 1 application topically 3 (three) times daily., Disp: 1 each, Rfl: 2  No Known Allergies   Review of Systems  Constitutional: Negative for chills, fever and malaise/fatigue.  Eyes: Negative for blurred vision.  Cardiovascular: Negative for chest pain and leg swelling.  Neurological: Negative for headaches.     Objective  Vitals:   07/11/16 0750  BP: 120/70  Pulse: 96  Resp: 18  Temp: 98.8 F (37.1 C)  TempSrc: Oral  SpO2: 96%  Weight: 211 lb 4.8 oz (95.8 kg)  Height: 5\' 7"  (1.702 m)    Physical Exam  Constitutional: He is oriented to person, place, and time and well-developed, well-nourished, and in no distress.  Cardiovascular: Normal rate, regular rhythm and normal heart sounds.   No murmur heard. Pulmonary/Chest: Effort normal and breath sounds normal. He has no wheezes.  Abdominal: Soft. Bowel sounds are normal.  Musculoskeletal:  Right ankle: He exhibits swelling.       Left ankle: He exhibits swelling.       Legs: Tightness in right gastrocnemius muscle, lateral right lower leg, worse with extension no swelling, in the proximal leg, no erythema.  Neurological: He is alert and oriented to person, place, and time.  Psychiatric: Mood, memory, affect and judgment normal.  Nursing note and vitals reviewed.     Assessment & Plan  1. Essential hypertension BP stable on present therapy - Candesartan Cilexetil-HCTZ 32-25 MG TABS; Take 1 tablet by mouth daily.  Dispense: 90 each; Refill: 1  2. Right leg pain This is likely associated with muscle spasm from overuse, start on when necessary nighttime muscle relaxant therapy. If no  relief, consider imaging - tiZANidine (ZANAFLEX) 4 MG capsule; Take 1 capsule (4 mg total) by mouth at bedtime as needed for muscle spasms.  Dispense: 30 capsule; Refill: 0  3. Primary osteoarthritis of right hip Pain in the right hip is improving, advised to take Meloxicam as needed - meloxicam (MOBIC) 15 MG tablet; Take 1 tablet (15 mg total) by mouth daily.  Dispense: 90 tablet; Refill: 0   Eh Sauseda [facility] 07/11/2016 8:05 AM

## 2016-07-20 ENCOUNTER — Ambulatory Visit
Admission: RE | Admit: 2016-07-20 | Discharge: 2016-07-20 | Disposition: A | Discharge status: Home / Self Care | Payer: 59 | Source: Ambulatory Visit | Attending: Family Medicine | Admitting: Family Medicine

## 2016-07-20 ENCOUNTER — Telehealth: Payer: Self-pay | Admitting: *Deleted

## 2016-07-20 ENCOUNTER — Ambulatory Visit
Admission: EM | Admit: 2016-07-20 | Discharge: 2016-07-20 | Disposition: A | Discharge status: Home / Self Care | Payer: 59 | Attending: Family Medicine | Admitting: Family Medicine

## 2016-07-20 ENCOUNTER — Encounter: Payer: Self-pay | Admitting: Emergency Medicine

## 2016-07-20 DIAGNOSIS — M1611 Unilateral primary osteoarthritis, right hip: Secondary | ICD-10-CM

## 2016-07-20 DIAGNOSIS — M79604 Pain in right leg: Secondary | ICD-10-CM | POA: Diagnosis not present

## 2016-07-20 DIAGNOSIS — T148 Other injury of unspecified body region: Secondary | ICD-10-CM | POA: Diagnosis not present

## 2016-07-20 DIAGNOSIS — T148XXA Other injury of unspecified body region, initial encounter: Secondary | ICD-10-CM

## 2016-07-20 MED ORDER — CELECOXIB 400 MG PO CAPS: 400.0000 mg | ORAL_CAPSULE | Freq: Every day | ORAL | 0 refills | Status: DC

## 2016-07-20 MED ORDER — ORPHENADRINE CITRATE ER 100 MG PO TB12: 100.0000 mg | ORAL_TABLET | Freq: Two times a day (BID) | ORAL | 0 refills | Status: DC

## 2016-07-20 NOTE — ED Provider Notes (Signed)
[facility]    CSN: [id number] Arrival date & time: 07/20/16  [id number]  First Provider Contact:  First MD Initiated Contact with Patient 07/20/16 [phone]        History   Chief Complaint Chief Complaint  Patient presents with  . Leg Pain    HPI Fernando Hart is a 65 y.o. male.   Patient is a 65 year old black male is complaining of pain in his right lower leg. He states that in early June he was care when doing a lot walking more walking usual with his grandchildren few days date he started having pain in his right leg. Since then he's had pain in his right lower leg. States he saw his PCP who placed him on Zanaflex but has not helped. Some of the physicians that he works with have raised the question about possible blood clot so he's got concerned about that as well. Should be noted that patient has been placed on Mobic for hip pain but he is not taking the Mobic at this time he took some Aleve yesterday and actually did know some improvement with the right lower pain.  He is a former smoker no known drug allergies he has hypertension and history gout no pertinent family medical history relating to this visit.    The history is provided by the patient. No language interpreter was used.  Leg Pain  Location:  Leg Injury: yes   Leg location:  R lower leg Pain details:    Quality:  Sharp, cramping and burning   Severity:  Moderate   Timing:  Constant   Progression:  Worsening Chronicity:  New Prior injury to area:  No Relieved by:  Nothing Ineffective treatments: zanaflex. Associated symptoms: no decreased ROM and no fatigue   Risk factors: no concern for non-accidental trauma, no frequent fractures and no obesity     Past Medical History:  Diagnosis Date  . Gout   . Hypertension     Patient Active Problem List   Diagnosis Date Noted  . Right leg pain 07/11/2016  . Degenerative joint disease of right hip 02/28/2016  . Hyperglycemia 01/10/2016  . Primary gout  01/10/2016  . Essential hypertension 01/10/2016    Past Surgical History:  Procedure Laterality Date  . NO PAST SURGERIES         Home Medications    Prior to Admission medications   Medication Sig Start Date End Date Taking? Authorizing Provider  allopurinol (ZYLOPRIM) 100 MG tablet Take 1 tablet (100 mg total) by mouth 2 (two) times daily. 01/13/16   Ashok Norris, MD  Candesartan Cilexetil-HCTZ 32-25 MG TABS Take 1 tablet by mouth daily. 07/11/16   Roselee Nova, MD  celecoxib (CELEBREX) 400 MG capsule Take 1 capsule (400 mg total) by mouth daily after breakfast. 07/20/16   Frederich Cha, MD  meloxicam (MOBIC) 15 MG tablet Take 1 tablet (15 mg total) by mouth daily. 07/11/16   Roselee Nova, MD  orphenadrine (NORFLEX) 100 MG tablet Take 1 tablet (100 mg total) by mouth 2 (two) times daily. 07/20/16   Frederich Cha, MD  tiZANidine (ZANAFLEX) 4 MG capsule Take 1 capsule (4 mg total) by mouth at bedtime as needed for muscle spasms. 07/11/16   Roselee Nova, MD  Triamcinolone Acetonide (TRIAMCINOLONE 0.1 % CREAM : EUCERIN) CREA Apply 1 application topically 3 (three) times daily. 01/10/16   Ashok Norris, MD    Family History Family History  Problem Relation Age of  Onset  . Hypertension Mother   . Diabetes Father     Social History Social History  Substance Use Topics  . Smoking status: Former Research scientist (life sciences)  . Smokeless tobacco: Former Systems developer    Quit date: 05/06/2015  . Alcohol use 0.0 oz/week     Allergies   Review of patient's allergies indicates no known allergies.   Review of Systems Review of Systems  Constitutional: Negative for fatigue.  All other systems reviewed and are negative.    Physical Exam Triage Vital Signs ED Triage Vitals  Enc Vitals Group     BP 07/20/16 0827 137/72     Pulse Rate 07/20/16 0827 67     Resp 07/20/16 0827 16     Temp 07/20/16 0827 97.6 F (36.4 C)     Temp Source 07/20/16 0827 Tympanic     SpO2 07/20/16 0827 98 %     Weight  07/20/16 0827 211 lb (95.7 kg)     Height 07/20/16 0827 5\' 7"  (1.702 m)     Head Circumference --      Peak Flow --      Pain Score 07/20/16 0830 5     Pain Loc --      Pain Edu? --      Excl. in [place]? --    No data found.   Updated Vital Signs BP 137/72 (BP Location: Right Arm)   Pulse 67   Temp 97.6 F (36.4 C) (Tympanic)   Resp 16   Ht 5\' 7"  (1.702 m)   Wt 211 lb (95.7 kg)   SpO2 98%   BMI 33.05 kg/m   Visual Acuity Right Eye Distance:   Left Eye Distance:   Bilateral Distance:    Right Eye Near:   Left Eye Near:    Bilateral Near:     Physical Exam  Constitutional: He is oriented to person, place, and time. He appears well-developed and well-nourished.  HENT:  Head: Normocephalic.  Eyes: Pupils are equal, round, and reactive to light.  Neck: Normal range of motion. Neck supple.  Pulmonary/Chest: Effort normal.  Musculoskeletal: Normal range of motion. He exhibits tenderness. He exhibits no edema or deformity.       Right lower leg: He exhibits tenderness. He exhibits no swelling, no edema and no deformity.       Legs: Patient is tender along the right lower leg along the anterior lateral side. Negative Homans sign  Neurological: He is alert and oriented to person, place, and time.  Skin: Skin is warm. Rash noted.  Psychiatric: He has a normal mood and affect.  Vitals reviewed.    UC Treatments / Results  Labs (all labs ordered are listed, but only abnormal results are displayed) Labs Reviewed - No data to display  EKG  EKG Interpretation None       Radiology No results found.  Procedures Procedures (including critical care time)  Medications Ordered in UC Medications - No data to display   Initial Impression / Assessment and Plan / UC Course  I have reviewed the triage vital signs and the nursing notes.  Pertinent labs & imaging results that were available during my care of the patient were reviewed by me and considered in my medical  decision making (see chart for details).  Clinical Course    Since Mobic was not effective in that has not been taking a regular basis and his Zanaflex did not work will switch him to Norflex 100 mg tablet twice a  day Celebrex 400 mg 1 capsule daily. We'll get Doppler study of his right lower leg to make sure this present and follow his PCP as needed. Work note given for today and tomorrow as well and Doppler study ordered for today. Final Clinical Impressions(s) / UC Diagnoses   Final diagnoses:  Right leg pain  Muscle strain    New Prescriptions New Prescriptions   CELECOXIB (CELEBREX) 400 MG CAPSULE    Take 1 capsule (400 mg total) by mouth daily after breakfast.   ORPHENADRINE (NORFLEX) 100 MG TABLET    Take 1 tablet (100 mg total) by mouth 2 (two) times daily.     Frederich Cha, MD 07/20/16 [phone]

## 2016-07-20 NOTE — ED Notes (Signed)
[place] scheduled for 4:00pm at [facility] on [address] in [place].

## 2016-07-20 NOTE — ED Triage Notes (Signed)
Patient c/o right lower leg pain that started a month ago.  Patient denies injury.

## 2016-07-30 ENCOUNTER — Ambulatory Visit: Payer: 59 | Admitting: Family Medicine

## 2016-08-29 ENCOUNTER — Other Ambulatory Visit: Payer: Self-pay | Admitting: Family Medicine

## 2016-08-29 DIAGNOSIS — M1611 Unilateral primary osteoarthritis, right hip: Secondary | ICD-10-CM

## 2016-09-23 IMAGING — US US EXTREM LOW VENOUS*R*
1 series · 13 of 24 positions shown · non-contrast
Comparison: None.

CLINICAL DATA: 65-year-old male with a history of right leg pain



[Series 1: us extrem low venous*right* · 0.08mm/px · 13 of 36 slices shown]
[im 1/36]
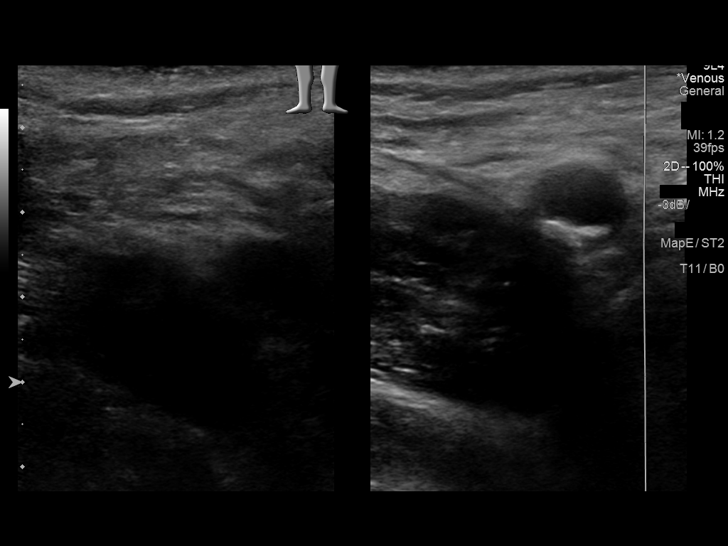
[im 4/36]
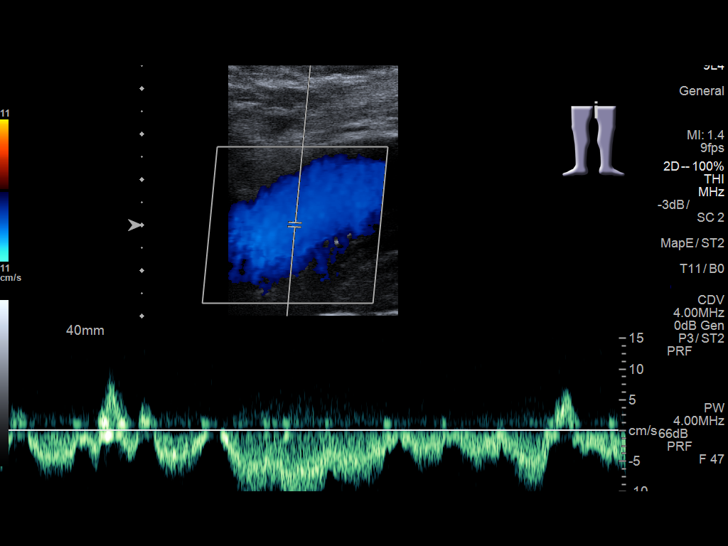
[im 7/36]
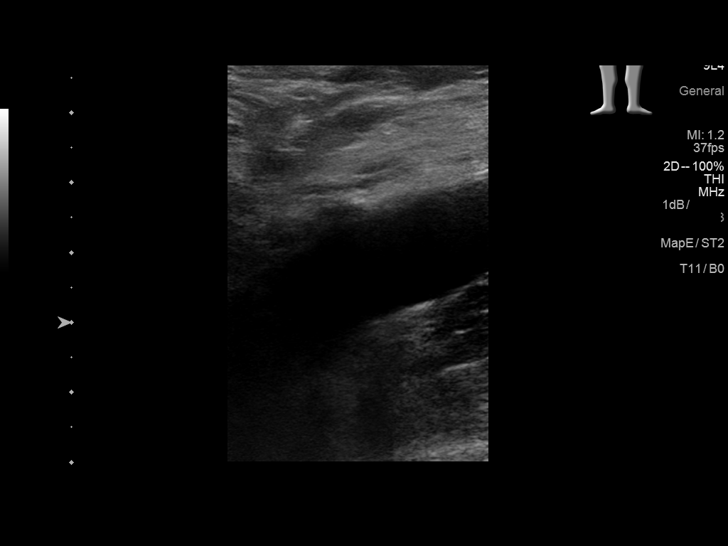
[im 10/36]
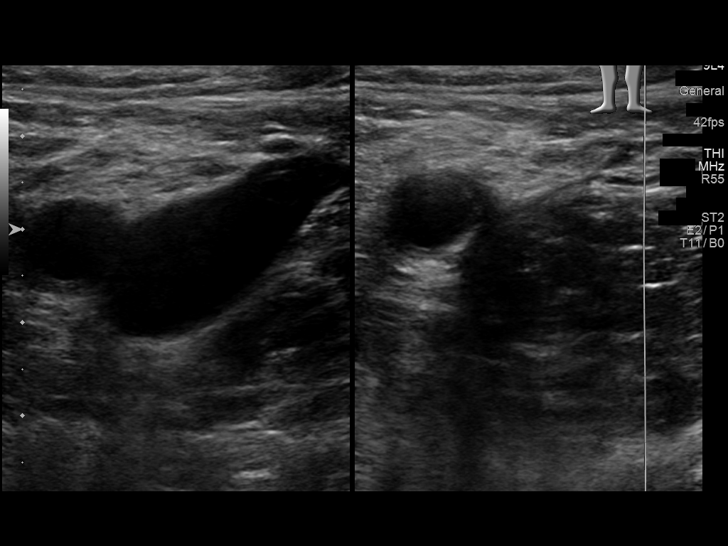
[im 13/36]
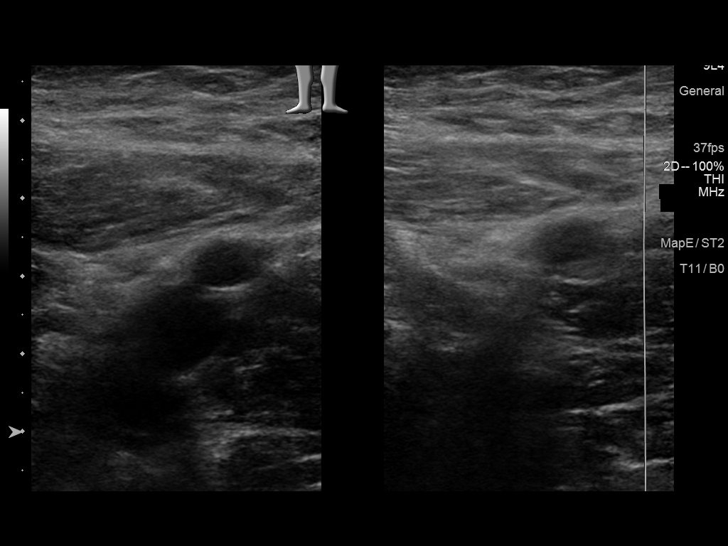
[im 16/36]
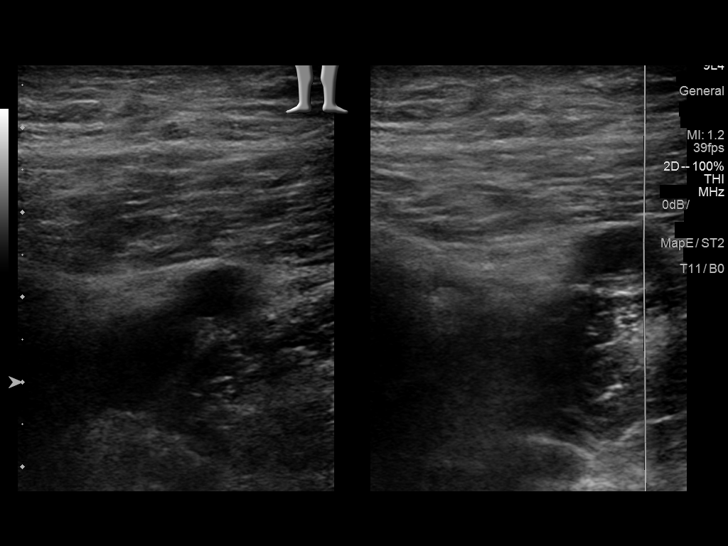
[im 19/36]
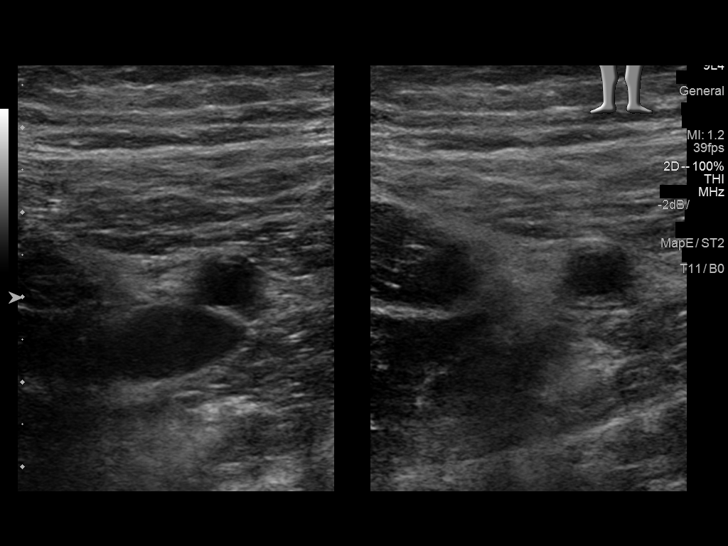
[im 20/36]
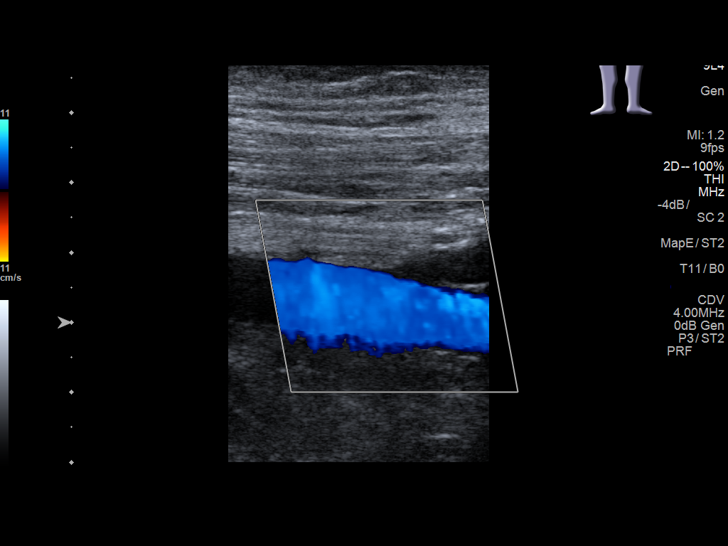
[im 23/36]
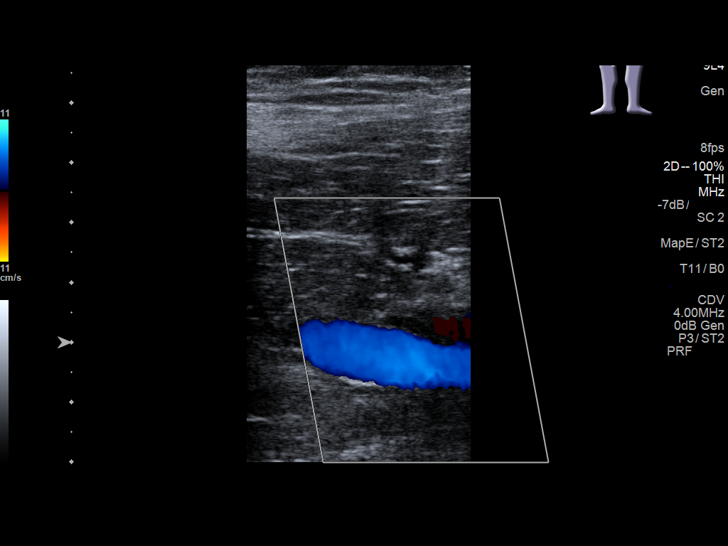
[im 26/36]
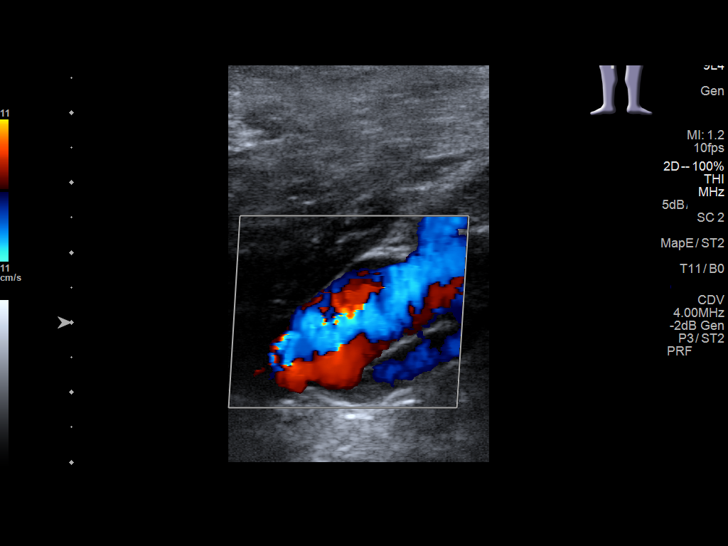
[im 29/36]
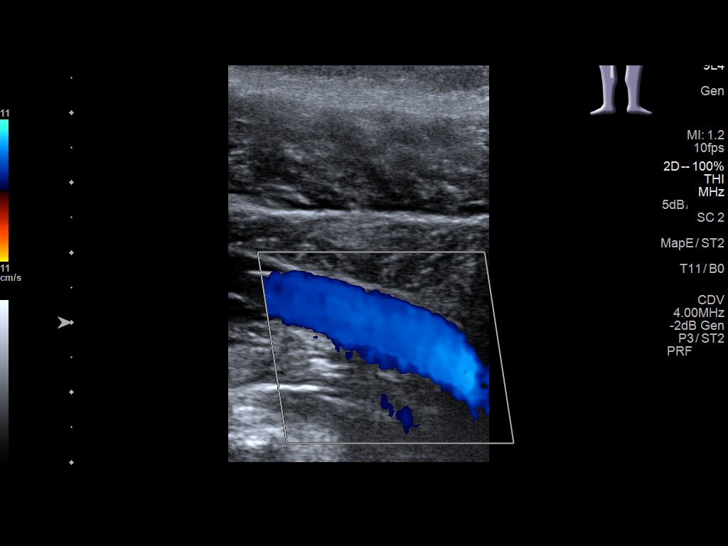
[im 32/36]
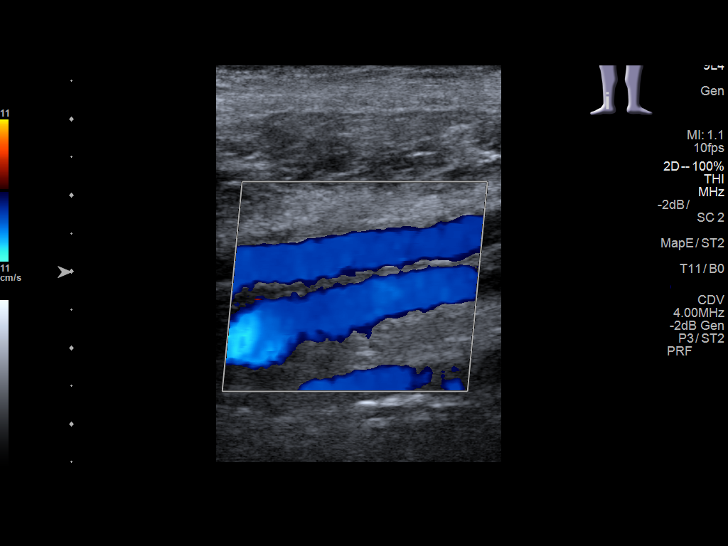
[im 36/36]
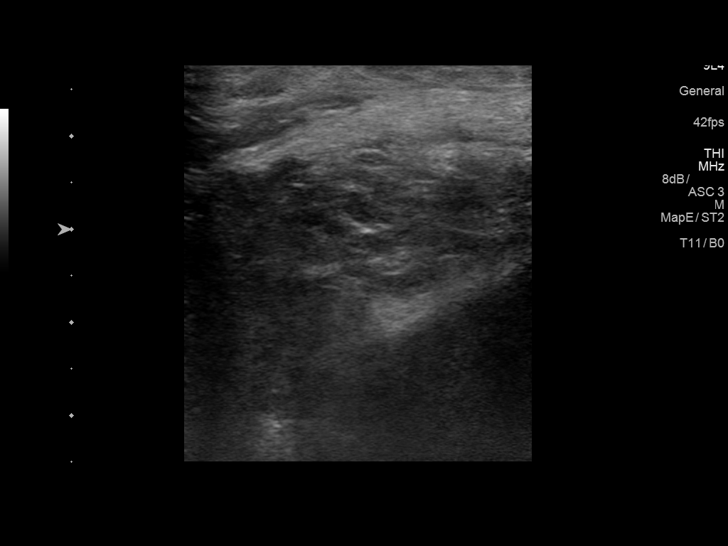

[13 of 24 positions shown; findings below may reference images not displayed]

FINDINGS: Contralateral Common Femoral Vein: Respiratory phasicity is normal
and symmetric with the symptomatic side. No evidence of thrombus.
Normal compressibility.

Common Femoral Vein: No evidence of thrombus. Normal
compressibility, respiratory phasicity and response to augmentation.

Saphenofemoral Junction: No evidence of thrombus. Normal
compressibility and flow on color Doppler imaging.

Profunda Femoral Vein: No evidence of thrombus. Normal
compressibility and flow on color Doppler imaging.

Femoral Vein: No evidence of thrombus. Normal compressibility,
respiratory phasicity and response to augmentation.

Popliteal Vein: No evidence of thrombus. Normal compressibility,
respiratory phasicity and response to augmentation.

Calf Veins: No evidence of thrombus. Normal compressibility and flow
on color Doppler imaging.

Superficial Great Saphenous Vein: No evidence of thrombus. Normal
compressibility and flow on color Doppler imaging.

Other Findings:  None.
IMPRESSION: Sonographic survey of the right lower extremity negative DVT.

## 2016-10-11 ENCOUNTER — Encounter: Payer: Self-pay | Admitting: Family Medicine

## 2016-10-11 ENCOUNTER — Ambulatory Visit (INDEPENDENT_AMBULATORY_CARE_PROVIDER_SITE_OTHER): Payer: 59 | Admitting: Family Medicine

## 2016-10-11 VITALS — BP 130/70 | HR 89 | Temp 98.1°F | Resp 16 | Ht 67.0 in | Wt 201.6 lb

## 2016-10-11 DIAGNOSIS — E6609 Other obesity due to excess calories: Secondary | ICD-10-CM

## 2016-10-11 DIAGNOSIS — M1A0720 Idiopathic chronic gout, left ankle and foot, without tophus (tophi): Secondary | ICD-10-CM

## 2016-10-11 DIAGNOSIS — I10 Essential (primary) hypertension: Secondary | ICD-10-CM | POA: Diagnosis not present

## 2016-10-11 DIAGNOSIS — Z6831 Body mass index (BMI) 31.0-31.9, adult: Secondary | ICD-10-CM | POA: Diagnosis not present

## 2016-10-11 DIAGNOSIS — R7303 Prediabetes: Secondary | ICD-10-CM | POA: Diagnosis not present

## 2016-10-11 DIAGNOSIS — E66811 Obesity, class 1: Secondary | ICD-10-CM

## 2016-10-11 DIAGNOSIS — E785 Hyperlipidemia, unspecified: Secondary | ICD-10-CM | POA: Diagnosis not present

## 2016-10-11 DIAGNOSIS — E669 Obesity, unspecified: Secondary | ICD-10-CM | POA: Insufficient documentation

## 2016-10-11 MED ORDER — CANDESARTAN CILEXETIL-HCTZ 32-25 MG PO TABS: 1.0000 | ORAL_TABLET | Freq: Every day | ORAL | 1 refills | Status: DC

## 2016-10-11 NOTE — Progress Notes (Signed)
Name: Fernando Hart   MRN: [id number]    DOB: 09-08-1951   Date:10/11/2016       Progress Note  Subjective  Chief Complaint  Chief Complaint  Patient presents with  . Hypertension    3 month follow up medication refills  . Follow-up    weight and escreen paperwork. Has joined weight watchers    Hypertension  This is a chronic problem. The problem is unchanged. The problem is controlled. Pertinent negatives include no blurred vision, chest pain, headaches, palpitations or shortness of breath. Past treatments include angiotensin blockers and diuretics. There is no history of kidney disease, CAD/MI or CVA.   Patient also wants to have the [employer] BMI appeal form completed. He has been working with [employer] and has lost 10 pounds in the last 3 months. BMI today is 31.58kg/m2.  Past Medical History:  Diagnosis Date  . Gout   . Hypertension     Past Surgical History:  Procedure Laterality Date  . NO PAST SURGERIES      Family History  Problem Relation Age of Onset  . Hypertension Mother   . Diabetes Father     Social History   Social History  . Marital status: Single    Spouse name: N/A  . Number of children: N/A  . Years of education: N/A   Occupational History  . Not on file.   Social History Main Topics  . Smoking status: Former Research scientist (life sciences)  . Smokeless tobacco: Former Systems developer    Quit date: 05/06/2015  . Alcohol use 0.0 oz/week  . Drug use: No  . Sexual activity: Yes    Partners: Female   Other Topics Concern  . Not on file   Social History Narrative  . No narrative on file     Current Outpatient Prescriptions:  .  allopurinol (ZYLOPRIM) 100 MG tablet, Take 1 tablet (100 mg total) by mouth 2 (two) times daily., Disp: 180 tablet, Rfl: 1 .  Candesartan Cilexetil-HCTZ 32-25 MG TABS, Take 1 tablet by mouth daily., Disp: 90 each, Rfl: 1 .  meloxicam (MOBIC) 15 MG tablet, Take 1 tablet by mouth  daily, Disp: 90 tablet, Rfl: 1  No Known  Allergies   Review of Systems  Eyes: Negative for blurred vision.  Respiratory: Negative for shortness of breath.   Cardiovascular: Negative for chest pain and palpitations.  Neurological: Negative for headaches.     Objective  Vitals:   10/11/16 0900  BP: 130/70  Pulse: 89  Resp: 16  Temp: 98.1 F (36.7 C)  TempSrc: Oral  SpO2: 97%  Weight: 201 lb 9.6 oz (91.4 kg)  Height: 5\' 7"  (1.702 m)    Physical Exam  Constitutional: He is oriented to person, place, and time and well-developed, well-nourished, and in no distress.  HENT:  Head: Normocephalic and atraumatic.  Cardiovascular: Normal rate, regular rhythm and normal heart sounds.   No murmur heard. Pulmonary/Chest: Effort normal and breath sounds normal.  Abdominal: Soft. Bowel sounds are normal. There is no tenderness.  Neurological: He is alert and oriented to person, place, and time.  Psychiatric: Mood, memory, affect and judgment normal.  Nursing note and vitals reviewed.    Assessment & Plan  1. Essential hypertension BP stable and controlled on present antihypertensive therapy - Candesartan Cilexetil-HCTZ 32-25 MG TABS; Take 1 tablet by mouth daily.  Dispense: 90 each; Refill: 1  2. Idiopathic chronic gout of left foot without tophus Elevated uric acid, taking allopurinol 100 mg daily. Recheck  today and consider increasing the dose to 200-300 mg daily still above goal - Uric acid  3. Prediabetes  A1c is 6.1%, consistent with prediabetes - POCT HgB A1C  4. Dyslipidemia  - Lipid Profile - COMPLETE METABOLIC PANEL WITH GFR  5. Class 1 obesity due to excess calories without serious comorbidity with body mass index (BMI) of 31.0 to 31.9 in adult Completed the [employer] appeal form, patient to continue with Weight [employer]chers program, increase physical activity as tolerated. Follow-up in 3 months   Fernando Hart [facility] 10/11/2016 9:02 AM

## 2016-10-25 ENCOUNTER — Other Ambulatory Visit: Payer: Self-pay | Admitting: Family Medicine

## 2016-10-26 LAB — COMPREHENSIVE METABOLIC PANEL
A/G RATIO: 1.6 (ref 1.2–2.2)
ALK PHOS: 81 IU/L (ref 39–117)
ALT: 34 IU/L (ref 0–44)
AST: 28 IU/L (ref 0–40)
Albumin: 4.5 g/dL (ref 3.6–4.8)
BILIRUBIN TOTAL: 0.5 mg/dL (ref 0.0–1.2)
BUN/Creatinine Ratio: 9 — ABNORMAL LOW (ref 10–24)
BUN: 12 mg/dL (ref 8–27)
CHLORIDE: 97 mmol/L (ref 96–106)
CO2: 26 mmol/L (ref 18–29)
Calcium: 10.2 mg/dL (ref 8.6–10.2)
Creatinine, Ser: 1.3 mg/dL — ABNORMAL HIGH (ref 0.76–1.27)
GFR calc Af Amer: 66 mL/min/{1.73_m2} (ref >59)
GFR calc non Af Amer: 57 mL/min/{1.73_m2} — ABNORMAL LOW (ref >59)
GLOBULIN, TOTAL: 2.8 g/dL (ref 1.5–4.5)
Glucose: 93 mg/dL (ref 65–99)
POTASSIUM: 3.8 mmol/L (ref 3.5–5.2)
SODIUM: 139 mmol/L (ref 134–144)
Total Protein: 7.3 g/dL (ref 6.0–8.5)

## 2016-10-26 LAB — LIPID PANEL WITH LDL/HDL RATIO
CHOLESTEROL TOTAL: 170 mg/dL (ref 100–199)
HDL: 38 mg/dL — AB (ref >39)
LDL Calculated: 109 mg/dL — ABNORMAL HIGH (ref 0–99)
LDl/HDL Ratio: 2.9 ratio units (ref 0.0–3.6)
TRIGLYCERIDES: 113 mg/dL (ref 0–149)
VLDL Cholesterol Cal: 23 mg/dL (ref 5–40)

## 2016-10-26 LAB — URIC ACID: Uric Acid: 7.2 mg/dL (ref 3.7–8.6)

## 2017-01-03 ENCOUNTER — Encounter: Payer: 59 | Admitting: Family Medicine

## 2017-01-18 ENCOUNTER — Encounter: Payer: Self-pay | Admitting: Family Medicine

## 2017-01-18 ENCOUNTER — Ambulatory Visit (INDEPENDENT_AMBULATORY_CARE_PROVIDER_SITE_OTHER): Payer: 59 | Admitting: Family Medicine

## 2017-01-18 DIAGNOSIS — I10 Essential (primary) hypertension: Secondary | ICD-10-CM | POA: Diagnosis not present

## 2017-01-18 DIAGNOSIS — J069 Acute upper respiratory infection, unspecified: Secondary | ICD-10-CM

## 2017-01-18 MED ORDER — BENZONATATE 200 MG PO CAPS: 200.0000 mg | ORAL_CAPSULE | Freq: Two times a day (BID) | ORAL | 0 refills | Status: DC | PRN

## 2017-01-18 MED ORDER — CANDESARTAN CILEXETIL-HCTZ 32-25 MG PO TABS: 1.0000 | ORAL_TABLET | Freq: Every day | ORAL | 1 refills | Status: DC

## 2017-01-18 MED ORDER — AZITHROMYCIN 250 MG PO TABS: ORAL_TABLET | ORAL | 0 refills | Status: DC

## 2017-01-18 NOTE — Progress Notes (Signed)
Name: Fernando Hart   MRN: [id number]    DOB: Jun 17, 1951   Date:01/18/2017       Progress Note  Subjective  Chief Complaint  Chief Complaint  Patient presents with  . Influenza    cough, fever, chills, bodyaches for 6 days    HPI  Pt. Presents for 6 day history of myalgias, cough, fever, and chills. Started getting sick Sunday night with general body aches, malaise, mild sore throat. Started with a productive cough with greenish mucus yesterday. He has been taking Robitussin cough syrup and Alka Seltzer, which has not helped significantly.  Advil which helps with malaise and body aches.   Past Medical History:  Diagnosis Date  . Gout   . Hypertension     Past Surgical History:  Procedure Laterality Date  . NO PAST SURGERIES      Family History  Problem Relation Age of Onset  . Hypertension Mother   . Diabetes Father     Social History   Social History  . Marital status: Single    Spouse name: N/A  . Number of children: N/A  . Years of education: N/A   Occupational History  . Not on file.   Social History Main Topics  . Smoking status: Former Research scientist (life sciences)  . Smokeless tobacco: Former Systems developer    Quit date: 05/06/2015  . Alcohol use 0.0 oz/week  . Drug use: No  . Sexual activity: Yes    Partners: Female   Other Topics Concern  . Not on file   Social History Narrative  . No narrative on file     Current Outpatient Prescriptions:  .  allopurinol (ZYLOPRIM) 100 MG tablet, Take 1 tablet (100 mg total) by mouth 2 (two) times daily., Disp: 180 tablet, Rfl: 1 .  Candesartan Cilexetil-HCTZ 32-25 MG TABS, Take 1 tablet by mouth daily., Disp: 90 each, Rfl: 1 .  meloxicam (MOBIC) 15 MG tablet, Take 1 tablet by mouth  daily, Disp: 90 tablet, Rfl: 1  No Known Allergies   ROS  Please see history of present illness for complete discussion of ROS  Objective  Vitals:   01/18/17 0848  BP: 124/68  Pulse: (!) 112  Resp: 18  Temp: 99 F (37.2 C)  TempSrc: Oral  SpO2:  96%  Weight: 202 lb 9.6 oz (91.9 kg)  Height: 5\' 7"  (1.702 m)    Physical Exam  Constitutional: He is oriented to person, place, and time and well-developed, well-nourished, and in no distress.  HENT:  Head: Normocephalic and atraumatic.  Right Ear: Tympanic membrane and ear canal normal. No drainage or swelling.  Left Ear: Tympanic membrane and ear canal normal. No drainage or swelling.  Nose: Right sinus exhibits no maxillary sinus tenderness and no frontal sinus tenderness. Left sinus exhibits no maxillary sinus tenderness and no frontal sinus tenderness.  Mouth/Throat: Posterior oropharyngeal erythema present.  Cardiovascular: Normal rate, regular rhythm, S1 normal, S2 normal and normal heart sounds.   No murmur heard. Pulmonary/Chest: Effort normal and breath sounds normal. He has no wheezes.  Neurological: He is alert and oriented to person, place, and time.  Nursing note and vitals reviewed.       Assessment & Plan  1. URI with cough and congestion Likely a viral upper respiratory infection, given his duration of symptoms, will start on Z-Pak for treatment - benzonatate (TESSALON) 200 MG capsule; Take 1 capsule (200 mg total) by mouth 2 (two) times daily as needed for cough.  Dispense: 20 capsule; Refill:  0 - azithromycin (ZITHROMAX) 250 MG tablet; 2 tabs po day 1, then 1 tab po q day x 4 days  Dispense: 6 tablet; Refill: 0  2. Essential hypertension Stable on present anti-hypertensive therapy - Candesartan Cilexetil-HCTZ 32-25 MG TABS; Take 1 tablet by mouth daily.  Dispense: 90 each; Refill: 1   Micalah Cabezas [facility] 01/18/2017 9:13 AM

## 2017-02-08 ENCOUNTER — Ambulatory Visit (INDEPENDENT_AMBULATORY_CARE_PROVIDER_SITE_OTHER): Payer: 59 | Admitting: Family Medicine

## 2017-02-08 ENCOUNTER — Encounter: Payer: Self-pay | Admitting: Family Medicine

## 2017-02-08 VITALS — BP 132/76 | HR 86 | Temp 98.5°F | Resp 18 | Ht 67.0 in | Wt 202.6 lb

## 2017-02-08 DIAGNOSIS — Z0000 Encounter for general adult medical examination without abnormal findings: Secondary | ICD-10-CM | POA: Diagnosis not present

## 2017-02-08 DIAGNOSIS — Z1211 Encounter for screening for malignant neoplasm of colon: Secondary | ICD-10-CM | POA: Diagnosis not present

## 2017-02-08 DIAGNOSIS — R7303 Prediabetes: Secondary | ICD-10-CM | POA: Diagnosis not present

## 2017-02-08 NOTE — Progress Notes (Signed)
Name: Fernando Hart   MRN: [id number]    DOB: August 25, 1951   Date:02/08/2017       Progress Note  Subjective  Chief Complaint  Chief Complaint  Patient presents with  . Annual Exam    HPI  Pt. Presents for Complete Physical exam. He had colonoscopy some time ago, records not available.  DRE and PSA completed 2 years ago.   Past Medical History:  Diagnosis Date  . Gout   . Hypertension     Past Surgical History:  Procedure Laterality Date  . NO PAST SURGERIES      Family History  Problem Relation Age of Onset  . Hypertension Mother   . Diabetes Father     Social History   Social History  . Marital status: Single    Spouse name: N/A  . Number of children: N/A  . Years of education: N/A   Occupational History  . Not on file.   Social History Main Topics  . Smoking status: Former Research scientist (life sciences)  . Smokeless tobacco: Former Systems developer    Quit date: 05/06/2015  . Alcohol use 0.0 oz/week  . Drug use: No  . Sexual activity: Yes    Partners: Female   Other Topics Concern  . Not on file   Social History Narrative  . No narrative on file     Current Outpatient Prescriptions:  .  allopurinol (ZYLOPRIM) 100 MG tablet, Take 1 tablet (100 mg total) by mouth 2 (two) times daily., Disp: 180 tablet, Rfl: 1 .  Candesartan Cilexetil-HCTZ 32-25 MG TABS, Take 1 tablet by mouth daily., Disp: 90 each, Rfl: 1 .  meloxicam (MOBIC) 15 MG tablet, Take 1 tablet by mouth  daily, Disp: 90 tablet, Rfl: 1  No Known Allergies   Review of Systems  Constitutional: Negative for chills, fever and malaise/fatigue.  HENT: Negative for congestion, ear pain, sinus pain and sore throat.   Eyes: Negative for blurred vision.  Respiratory: Negative for cough, shortness of breath and wheezing.   Cardiovascular: Negative for chest pain, palpitations and leg swelling.  Gastrointestinal: Negative for blood in stool, nausea and vomiting.  Genitourinary: Negative for dysuria and hematuria.   Musculoskeletal: Negative for back pain and neck pain.  Neurological: Negative for headaches.  Psychiatric/Behavioral: Negative for depression. The patient is not nervous/anxious and does not have insomnia.       Objective  Vitals:   02/08/17 0853  BP: 132/76  Pulse: 86  Resp: 18  Temp: 98.5 F (36.9 C)  TempSrc: Oral  SpO2: 95%  Weight: 202 lb 9.6 oz (91.9 kg)  Height: 5\' 7"  (1.702 m)    Physical Exam  Constitutional: He is oriented to person, place, and time and well-developed, well-nourished, and in no distress.  HENT:  Head: Normocephalic and atraumatic.  Right Ear: Tympanic membrane and ear canal normal.  Left Ear: Tympanic membrane and ear canal normal.  Eyes: Pupils are equal, round, and reactive to light.  Cardiovascular: Normal rate, regular rhythm and normal heart sounds.   No murmur heard. Pulmonary/Chest: Effort normal and breath sounds normal.  Abdominal: Soft. Bowel sounds are normal. There is no tenderness.  Genitourinary: Rectum normal and prostate normal. Rectal exam shows no tenderness.  Musculoskeletal:       Right ankle: He exhibits no swelling.       Left ankle: He exhibits no swelling.  Neurological: He is alert and oriented to person, place, and time.  Psychiatric: Mood, memory, affect and judgment normal.  Nursing note  and vitals reviewed.     Assessment & Plan  1. Annual physical exam Obtain age appropriate laboratory screenings. - CBC with Differential/Platelet - COMPLETE METABOLIC PANEL WITH GFR - Lipid panel - TSH - VITAMIN D 25 Hydroxy (Vit-D Deficiency, Fractures) - PSA - Hepatitis C antibody  2. Colon cancer screening  - Cologuard  3. Prediabetes  - HgB A1c   Kaytee Taliercio [facility] 02/08/2017 9:08 AM

## 2017-02-27 ENCOUNTER — Ambulatory Visit
Admission: EM | Admit: 2017-02-27 | Discharge: 2017-02-27 | Disposition: A | Discharge status: Home / Self Care | Payer: 59 | Attending: Family Medicine | Admitting: Family Medicine

## 2017-02-27 DIAGNOSIS — M533 Sacrococcygeal disorders, not elsewhere classified: Secondary | ICD-10-CM | POA: Diagnosis not present

## 2017-02-27 DIAGNOSIS — M5431 Sciatica, right side: Secondary | ICD-10-CM | POA: Diagnosis not present

## 2017-02-27 MED ORDER — PREDNISONE 10 MG (21) PO TBPK
ORAL_TABLET | ORAL | 0 refills | Status: DC
Start: 2017-02-27 — End: 2017-03-20

## 2017-02-27 MED ORDER — MELOXICAM 15 MG PO TABS: 15.0000 mg | ORAL_TABLET | Freq: Every day | ORAL | 0 refills | Status: DC

## 2017-02-27 MED ORDER — ORPHENADRINE CITRATE ER 100 MG PO TB12: 100.0000 mg | ORAL_TABLET | Freq: Two times a day (BID) | ORAL | 0 refills | Status: DC

## 2017-02-27 NOTE — ED Triage Notes (Signed)
Patient complains of right leg and knee pain that started on Sunday. Patient states that he was seen for this a while back and had a negative ultrasound. Patient reports that the pain had got better but worsened again Sunday.

## 2017-02-27 NOTE — ED Provider Notes (Signed)
[facility]    CSN: [id number] Arrival date & time: 02/27/17  1205     History   Chief Complaint Chief Complaint  Patient presents with  . Leg Pain  . Knee Pain    HPI Fernando Hart is a 66 y.o. male.   Patient is a 66 year old black male who comes in because pain going down his right leg. Should be noted that 3 years ago patient had pain in his right lower leg. Doppler study was done at that time to make sure there was no blood clot is present Mobic with some with resolution of the discomfort and pain. At that time he had been on his feet for a while with his grandchildren down at the music part [place]. He states this time he was done at the music part but started having pain that was started about mid right thigh which the knee or down into the right lower leg. He states he has been in some driving about 3 hours to [place] for his work.  He has a history of gout hypertension hyperlipidemia and prediabetic no past surgeries. He is a former smoker. No pertinent family medical history relevant to today's visit.   The history is provided by the patient. No language interpreter was used.  Leg Pain  Location:  Leg Time since incident:  4 days Injury: no   Leg location:  R leg, R upper leg and R lower leg Pain details:    Quality:  Aching, shooting and throbbing   Severity:  Moderate   Onset quality:  Sudden   Duration:  4 days   Timing:  Intermittent   Progression:  Worsening Chronicity:  New Dislocation: no   Prior injury to area:  No Associated symptoms: no back pain   Knee Pain  Associated symptoms: no back pain     Past Medical History:  Diagnosis Date  . Gout   . Hypertension     Patient Active Problem List   Diagnosis Date Noted  . Annual physical exam 02/08/2017  . Need for hepatitis C screening test 02/08/2017  . Colon cancer screening 02/08/2017  . URI with cough and congestion 01/18/2017  . Dyslipidemia 10/11/2016  . Obesity 10/11/2016   . Right leg pain 07/11/2016  . Degenerative joint disease of right hip 02/28/2016  . Prediabetes 01/10/2016  . Gout 01/10/2016  . Essential hypertension 01/10/2016    Past Surgical History:  Procedure Laterality Date  . NO PAST SURGERIES         Home Medications    Prior to Admission medications   Medication Sig Start Date End Date Taking? Authorizing Provider  allopurinol (ZYLOPRIM) 100 MG tablet Take 1 tablet (100 mg total) by mouth 2 (two) times daily. 01/13/16  Yes Ashok Norris, MD  Candesartan Cilexetil-HCTZ 32-25 MG TABS Take 1 tablet by mouth daily. 01/18/17  Yes Roselee Nova, MD  meloxicam [place]) 15 MG tablet Take 1 tablet by mouth  daily 08/30/16   Roselee Nova, MD  meloxicam (MOBIC) 15 MG tablet Take 1 tablet (15 mg total) by mouth daily. 02/27/17   Frederich Cha, MD  orphenadrine (NORFLEX) 100 MG tablet Take 1 tablet (100 mg total) by mouth 2 (two) times daily. 02/27/17   Frederich Cha, MD  predniSONE (STERAPRED UNI-PAK 21 TAB) 10 MG (21) TBPK tablet 6 tabs day 1 and 2, 5 tabs day 3 and 4, 4 tabs day 5 and 6, 3 tabs day 7 and 8, 2  tabs day 9 and 10, 1 tab day 11 and 12. Take orally 02/27/17   Frederich Cha, MD    Family History Family History  Problem Relation Age of Onset  . Hypertension Mother   . Diabetes Father     Social History Social History  Substance Use Topics  . Smoking status: Former Research scientist (life sciences)  . Smokeless tobacco: Former Systems developer    Quit date: 05/06/2015  . Alcohol use 0.0 oz/week     Allergies   Patient has no known allergies.   Review of Systems Review of Systems  Constitutional: Positive for activity change.  Musculoskeletal: Positive for gait problem and myalgias. Negative for arthralgias and back pain.  All other systems reviewed and are negative.    Physical Exam Triage Vital Signs ED Triage Vitals  Enc Vitals Group     BP 02/27/17 1318 140/66     Pulse Rate 02/27/17 1318 65     Resp 02/27/17 1318 16     Temp 02/27/17 1318 98.6 F  (37 C)     Temp Source 02/27/17 1318 Oral     SpO2 02/27/17 1318 99 %     Weight 02/27/17 1318 201 lb (91.2 kg)     Height 02/27/17 1318 5\' 7"  (1.702 m)     Head Circumference --      Peak Flow --      Pain Score 02/27/17 1320 6     Pain Loc --      Pain Edu? --      Excl. in [place]? --    No data found.   Updated Vital Signs BP 140/66 (BP Location: Left Arm)   Pulse 65   Temp 98.6 F (37 C) (Oral)   Resp 16   Ht 5\' 7"  (1.702 m)   Wt 201 lb (91.2 kg)   SpO2 99%   BMI 31.48 kg/m   Visual Acuity Right Eye Distance:   Left Eye Distance:   Bilateral Distance:    Right Eye Near:   Left Eye Near:    Bilateral Near:     Physical Exam  Constitutional: He is oriented to person, place, and time. He appears well-developed and well-nourished. No distress.  HENT:  Head: Normocephalic.  Eyes: Pupils are equal, round, and reactive to light. Right eye exhibits no discharge.  Neck: Normal range of motion. Neck supple.  Pulmonary/Chest: Effort normal.  Musculoskeletal: He exhibits tenderness. He exhibits no deformity.       Lumbar back: He exhibits decreased range of motion, tenderness, pain and spasm.       Back:  Relieved finally reproduce symptoms of the pain going down his right leg as the lower right iliac sacral area was palpated.  Neurological: He is alert and oriented to person, place, and time.  Skin: Skin is warm. He is not diaphoretic.  Vitals reviewed.    UC Treatments / Results  Labs (all labs ordered are listed, but only abnormal results are displayed) Labs Reviewed - No data to display  EKG  EKG Interpretation None       Radiology No results found.  Procedures Procedures (including critical care time)  Medications Ordered in UC Medications - No data to display    Initial Impression / Assessment and Plan / UC Course  I have reviewed the triage vital signs and the nursing notes.  Pertinent labs & imaging results that were available during my  care of the patient were reviewed by me and considered in my medical decision  making (see chart for details).   since were finally able to produce the pain palpating the lower iliac sacral crest area feel that this is recurrent cellulitis. We'll place him on Mobic 15 mg 1 tablet day prednisone 12 day Dosepak and will place on Norflex 100 mg twice a day. Recommend follow-up PCP in a week if not better or follow chiropractic choice to give him the information on [facility]    Final Clinical Impressions(s) / UC Diagnoses   Final diagnoses:  Sciatica of right side  Sacroiliac joint pain    New Prescriptions New Prescriptions   MELOXICAM (MOBIC) 15 MG TABLET    Take 1 tablet (15 mg total) by mouth daily.   ORPHENADRINE (NORFLEX) 100 MG TABLET    Take 1 tablet (100 mg total) by mouth 2 (two) times daily.   PREDNISONE (STERAPRED UNI-PAK 21 TAB) 10 MG (21) TBPK TABLET    6 tabs day 1 and 2, 5 tabs day 3 and 4, 4 tabs day 5 and 6, 3 tabs day 7 and 8, 2 tabs day 9 and 10, 1 tab day 11 and 12. Take orally   Note: This dictation was prepared with Dragon dictation along with smaller phrase technology. Any transcriptional errors that result from this process are unintentional.   Frederich Cha, MD 02/27/17 1421

## 2017-03-14 LAB — CMP14+EGFR
ALBUMIN: 4 g/dL (ref 3.6–4.8)
ALT: 61 IU/L — ABNORMAL HIGH (ref 0–44)
AST: 19 IU/L (ref 0–40)
Albumin/Globulin Ratio: 1.5 (ref 1.2–2.2)
Alkaline Phosphatase: 68 IU/L (ref 39–117)
BUN / CREAT RATIO: 18 (ref 10–24)
BUN: 20 mg/dL (ref 8–27)
Bilirubin Total: 0.6 mg/dL (ref 0.0–1.2)
CALCIUM: 9.4 mg/dL (ref 8.6–10.2)
CO2: 27 mmol/L (ref 18–29)
CREATININE: 1.12 mg/dL (ref 0.76–1.27)
Chloride: 99 mmol/L (ref 96–106)
GFR calc Af Amer: 79 mL/min/{1.73_m2} (ref >59)
GFR, EST NON AFRICAN AMERICAN: 68 mL/min/{1.73_m2} (ref >59)
GLOBULIN, TOTAL: 2.7 g/dL (ref 1.5–4.5)
Glucose: 100 mg/dL — ABNORMAL HIGH (ref 65–99)
Potassium: 4 mmol/L (ref 3.5–5.2)
SODIUM: 138 mmol/L (ref 134–144)
TOTAL PROTEIN: 6.7 g/dL (ref 6.0–8.5)

## 2017-03-14 LAB — CBC WITH DIFFERENTIAL/PLATELET
BASOS: 0 %
Basophils Absolute: 0 10*3/uL (ref 0.0–0.2)
EOS (ABSOLUTE): 0.3 10*3/uL (ref 0.0–0.4)
EOS: 5 %
HEMATOCRIT: 46.5 % (ref 37.5–51.0)
Hemoglobin: 15.8 g/dL (ref 13.0–17.7)
IMMATURE GRANULOCYTES: 0 %
Immature Grans (Abs): 0 10*3/uL (ref 0.0–0.1)
LYMPHS ABS: 2.5 10*3/uL (ref 0.7–3.1)
Lymphs: 36 %
MCH: 29.5 pg (ref 26.6–33.0)
MCHC: 34 g/dL (ref 31.5–35.7)
MCV: 87 fL (ref 79–97)
MONOS ABS: 0.9 10*3/uL (ref 0.1–0.9)
Monocytes: 13 %
NEUTROS PCT: 46 %
Neutrophils Absolute: 3.2 10*3/uL (ref 1.4–7.0)
Platelets: 177 10*3/uL (ref 150–379)
RBC: 5.35 x10E6/uL (ref 4.14–5.80)
RDW: 14.7 % (ref 12.3–15.4)
WBC: 7.1 10*3/uL (ref 3.4–10.8)

## 2017-03-14 LAB — TSH: TSH: 2.4 u[IU]/mL (ref 0.450–4.500)

## 2017-03-14 LAB — LIPID PANEL W/O CHOL/HDL RATIO
Cholesterol, Total: 166 mg/dL (ref 100–199)
HDL: 42 mg/dL (ref >39)
LDL Calculated: 100 mg/dL — ABNORMAL HIGH (ref 0–99)
Triglycerides: 121 mg/dL (ref 0–149)
VLDL Cholesterol Cal: 24 mg/dL (ref 5–40)

## 2017-03-14 LAB — HEPATITIS C ANTIBODY: Hep C Virus Ab: <0.1 s/co ratio (ref 0.0–0.9)

## 2017-03-14 LAB — PSA: Prostate Specific Ag, Serum: 1.2 ng/mL (ref 0.0–4.0)

## 2017-03-14 LAB — VITAMIN D 25 HYDROXY (VIT D DEFICIENCY, FRACTURES): Vit D, 25-Hydroxy: 10 ng/mL — ABNORMAL LOW (ref 30.0–100.0)

## 2017-03-14 LAB — URIC ACID: URIC ACID: 7.4 mg/dL (ref 3.7–8.6)

## 2017-03-14 LAB — HGB A1C W/O EAG: Hgb A1c MFr Bld: 6.2 % — ABNORMAL HIGH (ref 4.8–5.6)

## 2017-03-19 ENCOUNTER — Telehealth: Payer: Self-pay

## 2017-03-19 MED ORDER — VITAMIN D (ERGOCALCIFEROL) 1.25 MG (50000 UNIT) PO CAPS: 50000.0000 [IU] | ORAL_CAPSULE | ORAL | 0 refills | Status: DC

## 2017-03-19 NOTE — Telephone Encounter (Signed)
Patient has been notified of lab results and a prescription of vitamin D3 50,000 units take 1 capsule once a week x12 weeks has been sent to [facility] per Dr. Manuella Ghazi

## 2017-03-20 ENCOUNTER — Ambulatory Visit (INDEPENDENT_AMBULATORY_CARE_PROVIDER_SITE_OTHER): Payer: 59 | Admitting: Family Medicine

## 2017-03-20 ENCOUNTER — Ambulatory Visit: Payer: 59 | Admitting: Family Medicine

## 2017-03-20 ENCOUNTER — Encounter: Payer: Self-pay | Admitting: Family Medicine

## 2017-03-20 VITALS — BP 128/68 | HR 100 | Temp 98.2°F | Resp 16 | Ht 67.0 in | Wt 207.2 lb

## 2017-03-20 DIAGNOSIS — M5431 Sciatica, right side: Secondary | ICD-10-CM

## 2017-03-20 DIAGNOSIS — M1A0720 Idiopathic chronic gout, left ankle and foot, without tophus (tophi): Secondary | ICD-10-CM | POA: Diagnosis not present

## 2017-03-20 MED ORDER — FEBUXOSTAT 40 MG PO TABS: 40.0000 mg | ORAL_TABLET | Freq: Every day | ORAL | 0 refills | Status: DC

## 2017-03-20 MED ORDER — METAXALONE 800 MG PO TABS: 800.0000 mg | ORAL_TABLET | Freq: Every day | ORAL | 0 refills | Status: AC

## 2017-03-20 NOTE — Progress Notes (Signed)
Name: Fernando Hart   MRN: [id number]    DOB: 1951-08-07   Date:03/20/2017       Progress Note  Subjective  Chief Complaint  Chief Complaint  Patient presents with  . Follow-up    ER right lefg pain, Sciatica    HPI  Patient presents for Urgent Care follow up for right hip pain radiating down the side of his right leg, onset about 4 weeks ago, recurrent, seen at [facility] and was diagnosed with sciatica, was given a prescription for Prednisone and Norflex. He has finished Prednisone and believes that Norflex is not effective at all.   Patient is also concerned about elevated Uric acid level to 7.4 mg/dL, has history of gout and takes Allopurinol 100 mg twice daily. He has had no recent gout attacks. Has cut down on alcohol significantly.  Past Medical History:  Diagnosis Date  . Gout   . Hypertension     Past Surgical History:  Procedure Laterality Date  . NO PAST SURGERIES      Family History  Problem Relation Age of Onset  . Hypertension Mother   . Diabetes Father     Social History   Social History  . Marital status: Single    Spouse name: N/A  . Number of children: N/A  . Years of education: N/A   Occupational History  . Not on file.   Social History Main Topics  . Smoking status: Former Research scientist (life sciences)  . Smokeless tobacco: Former Systems developer    Quit date: 05/06/2015  . Alcohol use 0.0 oz/week  . Drug use: No  . Sexual activity: Yes    Partners: Female   Other Topics Concern  . Not on file   Social History Narrative  . No narrative on file     Current Outpatient Prescriptions:  .  allopurinol (ZYLOPRIM) 100 MG tablet, Take 1 tablet (100 mg total) by mouth 2 (two) times daily., Disp: 180 tablet, Rfl: 1 .  Candesartan Cilexetil-HCTZ 32-25 MG TABS, Take 1 tablet by mouth daily., Disp: 90 each, Rfl: 1 .  meloxicam (MOBIC) 15 MG tablet, Take 1 tablet by mouth  daily, Disp: 90 tablet, Rfl: 1 .  Vitamin D, Ergocalciferol, (DRISDOL) 50000 units CAPS capsule,  Take 1 capsule (50,000 Units total) by mouth once a week. For 12 weeks, Disp: 12 capsule, Rfl: 0 .  orphenadrine (NORFLEX) 100 MG tablet, Take 1 tablet (100 mg total) by mouth 2 (two) times daily. (Patient not taking: Reported on 03/20/2017), Disp: 60 tablet, Rfl: 0  No Known Allergies   ROS  Please see history of present illness for complete discussion of ROS  Objective  Vitals:   03/20/17 1456  BP: 128/68  Pulse: 100  Resp: 16  Temp: 98.2 F (36.8 C)  TempSrc: Oral  SpO2: 94%  Weight: 207 lb 3.2 oz (94 kg)  Height: 5\' 7"  (1.702 m)    Physical Exam  Constitutional: He is well-developed, well-nourished, and in no distress.  Musculoskeletal:       Right hip: He exhibits tenderness. He exhibits normal strength and no swelling.       Legs: Nursing note and vitals reviewed.      Assessment & Plan  1. Sciatica without lumbago, right Start on Skelaxin 800 mg at bedtime, advised to use heating pad for relief - metaxalone (SKELAXIN) 800 MG tablet; Take 1 tablet (800 mg total) by mouth at bedtime.  Dispense: 10 tablet; Refill: 0  2. Idiopathic chronic gout of left  foot without tophus DC allopurinol and start on Uloric, recheck uric acid levels in 4 weeks. - febuxostat (ULORIC) 40 MG tablet; Take 1 tablet (40 mg total) by mouth daily.  Dispense: 90 tablet; Refill: 0   Chon Buhl [facility] 03/20/2017 3:13 PM

## 2017-04-16 LAB — COLOGUARD: Cologuard: NEGATIVE

## 2017-05-08 ENCOUNTER — Other Ambulatory Visit: Payer: Self-pay | Admitting: Family Medicine

## 2017-05-08 ENCOUNTER — Ambulatory Visit (INDEPENDENT_AMBULATORY_CARE_PROVIDER_SITE_OTHER): Payer: 59 | Admitting: Family Medicine

## 2017-05-08 ENCOUNTER — Encounter: Payer: Self-pay | Admitting: Family Medicine

## 2017-05-08 VITALS — BP 130/71 | HR 97 | Temp 97.9°F | Resp 16 | Ht 67.0 in | Wt 208.8 lb

## 2017-05-08 DIAGNOSIS — Z23 Encounter for immunization: Secondary | ICD-10-CM

## 2017-05-08 DIAGNOSIS — R740 Nonspecific elevation of levels of transaminase and lactic acid dehydrogenase [LDH]: Secondary | ICD-10-CM

## 2017-05-08 DIAGNOSIS — I10 Essential (primary) hypertension: Secondary | ICD-10-CM

## 2017-05-08 DIAGNOSIS — M1A0720 Idiopathic chronic gout, left ankle and foot, without tophus (tophi): Secondary | ICD-10-CM

## 2017-05-08 DIAGNOSIS — R7401 Elevation of levels of liver transaminase levels: Secondary | ICD-10-CM

## 2017-05-08 MED ORDER — FEBUXOSTAT 40 MG PO TABS: 40.0000 mg | ORAL_TABLET | Freq: Every day | ORAL | 0 refills | Status: DC

## 2017-05-08 MED ORDER — CANDESARTAN CILEXETIL-HCTZ 32-25 MG PO TABS: 1.0000 | ORAL_TABLET | Freq: Every day | ORAL | 1 refills | Status: DC

## 2017-05-08 NOTE — Progress Notes (Signed)
Name: Fernando Hart   MRN: [id number]    DOB: November 01, 1951   Date:05/08/2017       Progress Note  Subjective  Chief Complaint  Chief Complaint  Patient presents with  . Follow-up    3 mo  . Medication Refill    Hypertension  This is a chronic problem. The problem is unchanged. The problem is controlled. Pertinent negatives include no blurred vision, chest pain, headaches, orthopnea, palpitations or shortness of breath. Past treatments include angiotensin blockers and diuretics. There is no history of kidney disease, CAD/MI or CVA.   Gout:  Patient has chronic gout, last attack was 6 weeks ago when his urate lowering therapy was changed from Allopurinol to Uloric. He seems to be doing well, it makes him drowsy, no recent gout attacks. Will repeat Uric Acid levels.    Past Medical History:  Diagnosis Date  . Gout   . Hypertension     Past Surgical History:  Procedure Laterality Date  . NO PAST SURGERIES      Family History  Problem Relation Age of Onset  . Hypertension Mother   . Diabetes Father     Social History   Social History  . Marital status: Single    Spouse name: N/A  . Number of children: N/A  . Years of education: N/A   Occupational History  . Not on file.   Social History Main Topics  . Smoking status: Former Research scientist (life sciences)  . Smokeless tobacco: Former Systems developer    Quit date: 05/06/2015  . Alcohol use 0.0 oz/week  . Drug use: No  . Sexual activity: Yes    Partners: Female   Other Topics Concern  . Not on file   Social History Narrative  . No narrative on file     Current Outpatient Prescriptions:  .  Candesartan Cilexetil-HCTZ 32-25 MG TABS, Take 1 tablet by mouth daily., Disp: 90 each, Rfl: 1 .  febuxostat (ULORIC) 40 MG tablet, Take 1 tablet (40 mg total) by mouth daily., Disp: 90 tablet, Rfl: 0 .  meloxicam (MOBIC) 15 MG tablet, Take 1 tablet by mouth  daily, Disp: 90 tablet, Rfl: 1 .  orphenadrine (NORFLEX) 100 MG tablet, Take 1 tablet (100 mg total)  by mouth 2 (two) times daily., Disp: 60 tablet, Rfl: 0 .  Vitamin D, Ergocalciferol, (DRISDOL) 50000 units CAPS capsule, Take 1 capsule (50,000 Units total) by mouth once a week. For 12 weeks, Disp: 12 capsule, Rfl: 0  No Known Allergies   Review of Systems  Eyes: Negative for blurred vision.  Respiratory: Negative for shortness of breath.   Cardiovascular: Negative for chest pain, palpitations and orthopnea.  Neurological: Negative for headaches.      Objective  Vitals:   05/08/17 0840  BP: 130/71  Pulse: 97  Resp: 16  Temp: 97.9 F (36.6 C)  TempSrc: Oral  SpO2: 96%  Weight: 208 lb 12.8 oz (94.7 kg)  Height: 5\' 7"  (1.702 m)    Physical Exam  Constitutional: He is oriented to person, place, and time and well-developed, well-nourished, and in no distress.  HENT:  Head: Normocephalic and atraumatic.  Cardiovascular: Normal rate, regular rhythm and normal heart sounds.   No murmur heard. Pulmonary/Chest: Effort normal and breath sounds normal. He has no wheezes.  Abdominal: Soft. Bowel sounds are normal. There is no tenderness.  Musculoskeletal: He exhibits edema (2+ pitting edema).  Neurological: He is alert and oriented to person, place, and time.  Psychiatric: Mood, memory, affect and judgment  normal.  Nursing note and vitals reviewed.  Assessment & Plan  1. Essential hypertension BP stable on present and hypertensive therapy - Candesartan Cilexetil-HCTZ 32-25 MG TABS; Take 1 tablet by mouth daily.  Dispense: 90 each; Refill: 1  2. Idiopathic chronic gout of left foot without tophus Now on Uloric 40 mg, repeat uric acid levels - Uric acid - febuxostat (ULORIC) 40 MG tablet; Take 1 tablet (40 mg total) by mouth daily.  Dispense: 90 tablet; Refill: 0  3. Elevated ALT measurement  - COMPLETE METABOLIC PANEL WITH GFR  4. Need for pneumococcal vaccination  - Pneumococcal conjugate vaccine 13-valent  Manpreet Kemmer [facility] 05/08/2017 9:08 AM

## 2017-06-05 ENCOUNTER — Other Ambulatory Visit: Payer: Self-pay | Admitting: Family Medicine

## 2017-06-06 LAB — CMP14+EGFR
A/G RATIO: 1.5 (ref 1.2–2.2)
ALBUMIN: 4.2 g/dL (ref 3.6–4.8)
ALT: 30 IU/L (ref 0–44)
AST: 38 IU/L (ref 0–40)
Alkaline Phosphatase: 61 IU/L (ref 39–117)
BUN / CREAT RATIO: 12 (ref 10–24)
BUN: 15 mg/dL (ref 8–27)
Bilirubin Total: 0.4 mg/dL (ref 0.0–1.2)
CO2: 20 mmol/L (ref 20–29)
Calcium: 9.7 mg/dL (ref 8.6–10.2)
Chloride: 99 mmol/L (ref 96–106)
Creatinine, Ser: 1.3 mg/dL — ABNORMAL HIGH (ref 0.76–1.27)
GFR calc non Af Amer: 56 mL/min/{1.73_m2} — ABNORMAL LOW (ref >59)
GFR, EST AFRICAN AMERICAN: 65 mL/min/{1.73_m2} (ref >59)
GLUCOSE: 96 mg/dL (ref 65–99)
Globulin, Total: 2.8 g/dL (ref 1.5–4.5)
Potassium: 3.7 mmol/L (ref 3.5–5.2)
Sodium: 139 mmol/L (ref 134–144)
TOTAL PROTEIN: 7 g/dL (ref 6.0–8.5)

## 2017-06-06 LAB — URIC ACID: Uric Acid: 6.6 mg/dL (ref 3.7–8.6)

## 2017-07-12 ENCOUNTER — Ambulatory Visit
Admission: EM | Admit: 2017-07-12 | Discharge: 2017-07-12 | Disposition: A | Discharge status: Home / Self Care | Payer: 59

## 2017-07-12 DIAGNOSIS — H9311 Tinnitus, right ear: Secondary | ICD-10-CM | POA: Diagnosis not present

## 2017-07-12 DIAGNOSIS — J029 Acute pharyngitis, unspecified: Secondary | ICD-10-CM | POA: Diagnosis not present

## 2017-07-12 DIAGNOSIS — T85848A Pain due to other internal prosthetic devices, implants and grafts, initial encounter: Secondary | ICD-10-CM | POA: Diagnosis not present

## 2017-07-12 HISTORY — DX: Vitamin D deficiency, unspecified: E55.9

## 2017-07-12 LAB — RAPID STREP SCREEN (MED CTR MEBANE ONLY): Streptococcus, Group A Screen (Direct): NEGATIVE

## 2017-07-12 MED ORDER — AMOXICILLIN 500 MG PO CAPS: 500.0000 mg | ORAL_CAPSULE | Freq: Three times a day (TID) | ORAL | 0 refills | Status: DC

## 2017-07-12 NOTE — ED Provider Notes (Signed)
CSN: [id number]     Arrival date & time 07/12/17  0805 History   None    Chief Complaint  Patient presents with  . Ear Pain  . Sore Throat   (Consider location/radiation/quality/duration/timing/severity/associated sxs/prior Treatment) The history is provided by the patient. No language interpreter was used.  Otalgia  Location:  Left Behind ear:  No abnormality Quality:  Aching, pressure and throbbing Severity:  Moderate Onset quality:  Sudden Duration:  2 days Timing:  Constant Progression:  Unchanged Chronicity:  New Context: not direct blow, not elevation change, not foreign body in ear, not loud noise, not recent URI and not water in ear   Context comment:  Recently had dental implant left lower molar; + sore throat, pt also reports ringing in right ear, denies recent trauma, noise exposure or new med or ASA intake Relieved by:  Nothing Worsened by:  Swallowing and cold air Ineffective treatments:  OTC medications Associated symptoms: congestion, sore throat and tinnitus   Associated symptoms: no abdominal pain, no cough, no diarrhea, no ear discharge, no fever, no headaches, no hearing loss, no neck pain, no rash, no rhinorrhea and no vomiting     Past Medical History:  Diagnosis Date  . Gout   . Hypertension   . Vitamin D deficiency    Past Surgical History:  Procedure Laterality Date  . NO PAST SURGERIES    . ROOT CANAL     Family History  Problem Relation Age of Onset  . Hypertension Mother   . Diabetes Father    Social History  Substance Use Topics  . Smoking status: Former Research scientist (life sciences)  . Smokeless tobacco: Former Systems developer    Quit date: 05/06/2015  . Alcohol use 0.0 oz/week    Review of Systems  Constitutional: Negative for fever.  HENT: Positive for congestion, ear pain, sore throat and tinnitus. Negative for ear discharge, hearing loss and rhinorrhea.   Eyes: Negative.   Respiratory: Negative for cough.   Cardiovascular: Negative.   Gastrointestinal:  Negative for abdominal pain, diarrhea and vomiting.  Endocrine: Negative.   Genitourinary: Negative.   Musculoskeletal: Negative for neck pain.  Skin: Negative for rash.  Allergic/Immunologic: Negative.   Neurological: Negative for headaches.  Hematological: Negative.   Psychiatric/Behavioral: Negative.   All other systems reviewed and are negative.   Allergies  Patient has no known allergies.  Home Medications   Prior to Admission medications   Medication Sig Start Date End Date Taking? Authorizing Provider  Candesartan Cilexetil-HCTZ 32-25 MG TABS Take 1 tablet by mouth daily. 05/08/17  Yes Roselee Nova, MD  Cholecalciferol (VITAMIN D PO) Take 5,000 Units by mouth daily.   Yes [provider]  febuxostat (ULORIC) 40 MG tablet Take 1 tablet (40 mg total) by mouth daily. 05/08/17 08/06/17 Yes Roselee Nova, MD  amoxicillin (AMOXIL) 500 MG capsule Take 1 capsule (500 mg total) by mouth 3 (three) times daily. 2/99/24   Adena Sima, Jeanett Schlein, NP   Meds Ordered and Administered this Visit  Medications - No data to display  BP 137/74 (BP Location: Left Arm)   Pulse 67   Temp 98.5 F (36.9 C) (Oral)   Resp 16   Ht 5\' 7"  (1.702 m)   Wt 204 lb (92.5 kg)   SpO2 97%   BMI 31.95 kg/m  No data found.   Physical Exam  Constitutional: He is oriented to person, place, and time. Vital signs are normal. He appears well-developed and well-nourished. He is active  and cooperative. No distress.  HENT:  Head: Normocephalic and atraumatic.  Right Ear: Tympanic membrane normal. No mastoid tenderness.  Left Ear: Tympanic membrane normal. No mastoid tenderness.  Nose: Nose normal.  Mouth/Throat: Uvula is midline, oropharynx is clear and moist and mucous membranes are normal. No oral lesions. No trismus in the jaw. No dental abscesses, uvula swelling or dental caries.  Eyes: Conjunctivae are normal.  Neck: Neck supple.  Cardiovascular: Normal rate, regular rhythm and normal pulses.    No murmur heard. Pulmonary/Chest: Effort normal and breath sounds normal. No respiratory distress.  Abdominal: Soft. There is no tenderness.  Musculoskeletal: He exhibits no edema.  Neurological: He is alert and oriented to person, place, and time. GCS eye subscore is 4. GCS verbal subscore is 5. GCS motor subscore is 6.  Skin: Skin is warm and dry.  Psychiatric: He has a normal mood and affect. His speech is normal and behavior is normal.  Nursing note and vitals reviewed.   Urgent Care Course     Procedures (including critical care time)  Labs Review Labs Reviewed  RAPID STREP SCREEN (NOT AT [facility])  CULTURE, GROUP A STREP [facility])    Imaging Review No results found.      MDM   1. Sore throat   2. Dental implant pain, initial encounter   3. Tinnitus of right ear    Rapid strep test ordered.  Discussed results and plan of care with pt: Your strep test was negative. We will treat you for possible dental infection related to recent dental work. Do not crunch on ice or grind teeth, no hard candy or gum chewing. Please follow up with your dental provider on Monday am. Go to Er for new or worsening pain and or symptoms. Pt verbalized understanding to this provider.    Tori Milks, NP 01/58/68 [phone]

## 2017-07-12 NOTE — Discharge Instructions (Signed)
Your strep test was negative. We will treat you for possible dental infection related to recent dental work. Do not crunch on ice or grind teeth, no hard candy or gum chewing. Please follow up with your dental provider on Monday am. Go to Er for new or worsening pain and or symptoms.

## 2017-07-12 NOTE — ED Triage Notes (Signed)
66 year old [place] American male is here today with complaints of left ear pain, sore throat and tinnitus in the right ear for the past two days. He denies fever, coughing,sneezing or sinus pressure.

## 2017-07-14 LAB — CULTURE, GROUP A STREP (THRC)

## 2017-07-16 ENCOUNTER — Ambulatory Visit (INDEPENDENT_AMBULATORY_CARE_PROVIDER_SITE_OTHER): Payer: 59 | Admitting: Family Medicine

## 2017-07-16 ENCOUNTER — Other Ambulatory Visit: Payer: Self-pay | Admitting: Family Medicine

## 2017-07-16 ENCOUNTER — Encounter: Payer: Self-pay | Admitting: Family Medicine

## 2017-07-16 VITALS — BP 138/69 | HR 83 | Temp 97.4°F | Resp 16 | Ht 67.0 in | Wt 206.4 lb

## 2017-07-16 DIAGNOSIS — R202 Paresthesia of skin: Secondary | ICD-10-CM

## 2017-07-16 DIAGNOSIS — M25472 Effusion, left ankle: Secondary | ICD-10-CM | POA: Diagnosis not present

## 2017-07-16 DIAGNOSIS — R200 Anesthesia of skin: Secondary | ICD-10-CM

## 2017-07-16 DIAGNOSIS — M25471 Effusion, right ankle: Secondary | ICD-10-CM

## 2017-07-16 MED ORDER — GABAPENTIN 300 MG PO CAPS: 300.0000 mg | ORAL_CAPSULE | Freq: Three times a day (TID) | ORAL | 0 refills | Status: DC

## 2017-07-16 MED ORDER — FUROSEMIDE 20 MG PO TABS: 20.0000 mg | ORAL_TABLET | Freq: Every day | ORAL | 0 refills | Status: DC

## 2017-07-16 NOTE — Progress Notes (Signed)
Name: Fernando Hart   MRN: [id number]    DOB: 1951/04/06   Date:07/16/2017       Progress Note  Subjective  Chief Complaint  Chief Complaint  Patient presents with  . Tingling    left ankle and hand    HPI  Left Hand Tingling: Present for at least 2 weeks, noticed first in his fingers, feels as if his fingers are 'burning'. Squeezing, making a fist, and other activities bring on the tingling sensation, he feels as if its due to arthritis. He denies any neck pain or arm pain or numbness. He has not tried any medication for relief.  Ankle Swelling:  Pt. Presents for bilateral ankle swelling, present for 'a while' at least a year or so, but his ankles are swelling more now, for longer duration. Ankle swelling is present when he wakes up and as the day goes, it goes worse, he notices a high salt diet makes his ankles swell and hurt more.  No chest pain, dyspnea, palpitations, abdominal pain, or other symptoms.    Past Medical History:  Diagnosis Date  . Gout   . Hypertension   . Vitamin D deficiency     Past Surgical History:  Procedure Laterality Date  . NO PAST SURGERIES    . ROOT CANAL      Family History  Problem Relation Age of Onset  . Hypertension Mother   . Diabetes Father     Social History   Social History  . Marital status: Single    Spouse name: N/A  . Number of children: N/A  . Years of education: N/A   Occupational History  . Not on file.   Social History Main Topics  . Smoking status: Former Research scientist (life sciences)  . Smokeless tobacco: Former Systems developer    Quit date: 05/06/2015  . Alcohol use 0.0 oz/week  . Drug use: No  . Sexual activity: Yes    Partners: Female   Other Topics Concern  . Not on file   Social History Narrative  . No narrative on file     Current Outpatient Prescriptions:  .  amoxicillin (AMOXIL) 500 MG capsule, Take 1 capsule (500 mg total) by mouth 3 (three) times daily., Disp: 21 capsule, Rfl: 0 .  Candesartan Cilexetil-HCTZ 32-25 MG TABS,  Take 1 tablet by mouth daily., Disp: 90 each, Rfl: 1 .  Cholecalciferol (VITAMIN D PO), Take 5,000 Units by mouth daily., Disp: , Rfl:  .  febuxostat (ULORIC) 40 MG tablet, Take 1 tablet (40 mg total) by mouth daily., Disp: 90 tablet, Rfl: 0  No Known Allergies   ROS  Please see history of present illness for complete discussion of ROS  Objective  Vitals:   07/16/17 1022  BP: 138/69  Pulse: 83  Resp: 16  Temp: (!) 97.4 F (36.3 C)  TempSrc: Oral  SpO2: 96%  Weight: 206 lb 6.4 oz (93.6 kg)  Height: 5\' 7"  (1.702 m)    Physical Exam  Constitutional: He is oriented to person, place, and time and well-developed, well-nourished, and in no distress.  HENT:  Head: Normocephalic and atraumatic.  Cardiovascular: Normal rate, regular rhythm and normal heart sounds.   No murmur heard. Pulmonary/Chest: Effort normal and breath sounds normal. He has no wheezes.  Abdominal: Soft. Bowel sounds are normal. There is no tenderness.  Musculoskeletal: He exhibits edema.       Right ankle: He exhibits swelling.       Left ankle: He exhibits no swelling.  Left hand: He exhibits no tenderness, normal two-point discrimination, normal capillary refill, no deformity and no swelling.  Abduction of left hand at wrist joint brings on the tingling sensation.   Neurological: He is alert and oriented to person, place, and time.  Nursing note and vitals reviewed.    Assessment & Plan  1. Numbness and tingling of left hand Clinical picture consistent with carpal tunnel, advised stabilization of wrist with a wrap, start on gabapentin for treatment of neuropathic pain, if ineffective may be referred to orthopedics - gabapentin (NEURONTIN) 300 MG capsule; Take 1 capsule (300 mg total) by mouth 3 (three) times daily. Start 300 mg po qd x 1 day, then 300 mg po bid x 1 day, then 300 mg po TID.  Dispense: 30 capsule; Refill: 0  2. Ankle edema, bilateral Likely dependent edema, obtain BMP and chest  x-ray to rule out early onset CHF, start on Lasix once daily - furosemide (LASIX) 20 MG tablet; Take 1 tablet (20 mg total) by mouth daily.  Dispense: 30 tablet; Refill: 0 - COMPLETE METABOLIC PANEL WITH GFR - Brain natriuretic peptide - DG Chest 2 View; Future '   Devan Babino [facility] 07/16/2017 10:30 AM

## 2017-07-17 LAB — CMP14+EGFR
A/G RATIO: 1.6 (ref 1.2–2.2)
ALK PHOS: 57 IU/L (ref 39–117)
ALT: 28 IU/L (ref 0–44)
AST: 44 IU/L — AB (ref 0–40)
Albumin: 4.4 g/dL (ref 3.6–4.8)
BUN/Creatinine Ratio: 15 (ref 10–24)
BUN: 19 mg/dL (ref 8–27)
Bilirubin Total: 0.3 mg/dL (ref 0.0–1.2)
CALCIUM: 9.9 mg/dL (ref 8.6–10.2)
CO2: 23 mmol/L (ref 20–29)
Chloride: 99 mmol/L (ref 96–106)
Creatinine, Ser: 1.23 mg/dL (ref 0.76–1.27)
GFR calc Af Amer: 70 mL/min/{1.73_m2} (ref >59)
GFR, EST NON AFRICAN AMERICAN: 60 mL/min/{1.73_m2} (ref >59)
GLOBULIN, TOTAL: 2.8 g/dL (ref 1.5–4.5)
Glucose: 113 mg/dL — ABNORMAL HIGH (ref 65–99)
POTASSIUM: 3.8 mmol/L (ref 3.5–5.2)
SODIUM: 138 mmol/L (ref 134–144)
Total Protein: 7.2 g/dL (ref 6.0–8.5)

## 2017-07-17 LAB — BRAIN NATRIURETIC PEPTIDE: BNP: 24.9 pg/mL (ref 0.0–100.0)

## 2017-07-23 ENCOUNTER — Ambulatory Visit
Admission: RE | Admit: 2017-07-23 | Discharge: 2017-07-23 | Disposition: A | Discharge status: Home / Self Care | Payer: 59 | Source: Ambulatory Visit | Attending: Family Medicine | Admitting: Family Medicine

## 2017-07-23 DIAGNOSIS — M25472 Effusion, left ankle: Secondary | ICD-10-CM | POA: Insufficient documentation

## 2017-07-23 DIAGNOSIS — M25471 Effusion, right ankle: Secondary | ICD-10-CM | POA: Diagnosis present

## 2017-08-08 ENCOUNTER — Ambulatory Visit: Payer: 59 | Admitting: Family Medicine

## 2017-08-26 ENCOUNTER — Encounter: Payer: Self-pay | Admitting: Family Medicine

## 2017-08-26 ENCOUNTER — Ambulatory Visit (INDEPENDENT_AMBULATORY_CARE_PROVIDER_SITE_OTHER): Payer: 59 | Admitting: Family Medicine

## 2017-08-26 VITALS — BP 134/66 | HR 88 | Temp 98.3°F | Resp 16 | Ht 67.0 in | Wt 208.7 lb

## 2017-08-26 DIAGNOSIS — R7401 Elevation of levels of liver transaminase levels: Secondary | ICD-10-CM

## 2017-08-26 DIAGNOSIS — E785 Hyperlipidemia, unspecified: Secondary | ICD-10-CM

## 2017-08-26 DIAGNOSIS — R740 Nonspecific elevation of levels of transaminase and lactic acid dehydrogenase [LDH]: Secondary | ICD-10-CM

## 2017-08-26 DIAGNOSIS — M1A0720 Idiopathic chronic gout, left ankle and foot, without tophus (tophi): Secondary | ICD-10-CM | POA: Diagnosis not present

## 2017-08-26 DIAGNOSIS — I10 Essential (primary) hypertension: Secondary | ICD-10-CM

## 2017-08-26 MED ORDER — FEBUXOSTAT 40 MG PO TABS: 40.0000 mg | ORAL_TABLET | Freq: Every day | ORAL | 0 refills | Status: DC

## 2017-08-26 NOTE — Progress Notes (Signed)
Name: Fernando Hart   MRN: [id number]    DOB: 1951/04/22   Date:08/26/2017       Progress Note  Subjective  Chief Complaint  Chief Complaint  Patient presents with  . Follow-up    3 mo  . Hyperlipidemia    Hyperlipidemia  This is a chronic problem. The problem is uncontrolled. Recent lipid tests were reviewed and are high (elevated LDL). Pertinent negatives include no chest pain, leg pain or shortness of breath. He is currently on no antihyperlipidemic treatment.  Hypertension  This is a chronic problem. The problem is unchanged. The problem is controlled. Pertinent negatives include no blurred vision, chest pain, headaches, palpitations or shortness of breath. Past treatments include angiotensin blockers and diuretics.   Patient's AST was elevated to 41 on last lab draw, he has no history of liver disease, no abdominal pain, no changes in skin color. He drinks alcohol sparingly, does not take Tylenol, and does not smoke.   Patient has history of gout, last attack affected his left great toe, he has been on Uloric 40 mg daily, no recent gout attacks. He is not taking it regularly, lst Uric acid level was 6.6 mg/dL.    Past Medical History:  Diagnosis Date  . Gout   . Hypertension   . Vitamin D deficiency     Past Surgical History:  Procedure Laterality Date  . NO PAST SURGERIES    . ROOT CANAL      Family History  Problem Relation Age of Onset  . Hypertension Mother   . Diabetes Father     Social History   Social History  . Marital status: Single    Spouse name: N/A  . Number of children: N/A  . Years of education: N/A   Occupational History  . Not on file.   Social History Main Topics  . Smoking status: Former Games developer  . Smokeless tobacco: Former Neurosurgeon    Quit date: 05/06/2015  . Alcohol use 0.0 oz/week  . Drug use: No  . Sexual activity: Yes    Partners: Female   Other Topics Concern  . Not on file   Social History Narrative  . No narrative on file      Current Outpatient Prescriptions:  .  Candesartan Cilexetil-HCTZ 32-25 MG TABS, Take 1 tablet by mouth daily., Disp: 90 each, Rfl: 1 .  Cholecalciferol (VITAMIN D PO), Take 5,000 Units by mouth daily., Disp: , Rfl:  .  febuxostat (ULORIC) 40 MG tablet, Take 1 tablet (40 mg total) by mouth daily., Disp: 90 tablet, Rfl: 0  No Known Allergies   Review of Systems  Eyes: Negative for blurred vision.  Respiratory: Negative for shortness of breath.   Cardiovascular: Negative for chest pain and palpitations.  Neurological: Negative for headaches.    Objective  Vitals:   08/26/17 0954  BP: 134/66  Pulse: 88  Resp: 16  Temp: 98.3 F (36.8 C)  TempSrc: Oral  SpO2: 96%  Weight: 208 lb 11.2 oz (94.7 kg)  Height: 5\' 7"  (1.702 m)    Physical Exam  Constitutional: He is oriented to person, place, and time and well-developed, well-nourished, and in no distress.  HENT:  Head: Normocephalic and atraumatic.  Cardiovascular: Normal rate, regular rhythm and normal heart sounds.   No murmur heard. Pulmonary/Chest: Effort normal and breath sounds normal. He has no wheezes.  Abdominal: Soft. Bowel sounds are normal. There is no tenderness.  Musculoskeletal: He exhibits edema and tenderness.  Neurological: He is  alert and oriented to person, place, and time.  Psychiatric: Mood, memory, affect and judgment normal.  Nursing note and vitals reviewed.      Assessment & Plan  1. Idiopathic chronic gout of left foot without tophus Continue on Uloric, obtain uric acid levels - febuxostat (ULORIC) 40 MG tablet; Take 1 tablet (40 mg total) by mouth daily.  Dispense: 90 tablet; Refill: 0 - Uric acid  2. Essential hypertension BP stable on present antihypertensive treatment  3. Elevated AST (SGOT) Likely consistent with fatty liver, and repeat levels and consider workup - COMPLETE METABOLIC PANEL WITH GFR  4. Dyslipidemia  - Lipid panel   Anisah Kuck [facility] 08/26/2017 10:04 AM

## 2017-09-21 ENCOUNTER — Other Ambulatory Visit: Payer: Self-pay | Admitting: Family Medicine

## 2017-09-21 DIAGNOSIS — M25472 Effusion, left ankle: Principal | ICD-10-CM

## 2017-09-21 DIAGNOSIS — M25471 Effusion, right ankle: Secondary | ICD-10-CM

## 2017-10-14 ENCOUNTER — Other Ambulatory Visit: Payer: Self-pay | Admitting: Family Medicine

## 2017-10-14 DIAGNOSIS — M1A0720 Idiopathic chronic gout, left ankle and foot, without tophus (tophi): Secondary | ICD-10-CM

## 2017-11-15 ENCOUNTER — Ambulatory Visit (INDEPENDENT_AMBULATORY_CARE_PROVIDER_SITE_OTHER): Payer: 59

## 2017-11-15 DIAGNOSIS — Z23 Encounter for immunization: Secondary | ICD-10-CM | POA: Diagnosis not present

## 2017-11-20 ENCOUNTER — Other Ambulatory Visit: Payer: Self-pay | Admitting: Family Medicine

## 2017-11-21 LAB — CMP14+EGFR
A/G RATIO: 1.4 (ref 1.2–2.2)
ALK PHOS: 65 IU/L (ref 39–117)
ALT: 33 IU/L (ref 0–44)
AST: 40 IU/L (ref 0–40)
Albumin: 4.3 g/dL (ref 3.6–4.8)
BILIRUBIN TOTAL: 0.5 mg/dL (ref 0.0–1.2)
BUN / CREAT RATIO: 10 (ref 10–24)
BUN: 13 mg/dL (ref 8–27)
CO2: 25 mmol/L (ref 20–29)
Calcium: 9.8 mg/dL (ref 8.6–10.2)
Chloride: 98 mmol/L (ref 96–106)
Creatinine, Ser: 1.25 mg/dL (ref 0.76–1.27)
GFR calc Af Amer: 68 mL/min/{1.73_m2} (ref >59)
GFR calc non Af Amer: 59 mL/min/{1.73_m2} — ABNORMAL LOW (ref >59)
GLUCOSE: 80 mg/dL (ref 65–99)
Globulin, Total: 3 g/dL (ref 1.5–4.5)
POTASSIUM: 4 mmol/L (ref 3.5–5.2)
SODIUM: 141 mmol/L (ref 134–144)
Total Protein: 7.3 g/dL (ref 6.0–8.5)

## 2017-11-21 LAB — LIPID PANEL W/O CHOL/HDL RATIO
CHOLESTEROL TOTAL: 169 mg/dL (ref 100–199)
HDL: 34 mg/dL — ABNORMAL LOW (ref >39)
LDL CALC: 113 mg/dL — AB (ref 0–99)
Triglycerides: 112 mg/dL (ref 0–149)
VLDL Cholesterol Cal: 22 mg/dL (ref 5–40)

## 2017-11-21 LAB — URIC ACID: URIC ACID: 4.8 mg/dL (ref 3.7–8.6)

## 2017-11-25 ENCOUNTER — Ambulatory Visit: Payer: 59 | Admitting: Family Medicine

## 2017-12-05 ENCOUNTER — Ambulatory Visit: Payer: 59 | Admitting: Family Medicine

## 2017-12-10 ENCOUNTER — Other Ambulatory Visit: Payer: Self-pay | Admitting: Family Medicine

## 2017-12-10 DIAGNOSIS — I10 Essential (primary) hypertension: Secondary | ICD-10-CM

## 2017-12-23 ENCOUNTER — Ambulatory Visit: Payer: 59 | Admitting: Family Medicine

## 2017-12-23 ENCOUNTER — Encounter: Payer: Self-pay | Admitting: Family Medicine

## 2017-12-23 VITALS — BP 112/58 | HR 70 | Temp 98.4°F | Resp 16 | Ht 67.0 in | Wt 207.8 lb

## 2017-12-23 DIAGNOSIS — I10 Essential (primary) hypertension: Secondary | ICD-10-CM | POA: Diagnosis not present

## 2017-12-23 DIAGNOSIS — E7800 Pure hypercholesterolemia, unspecified: Secondary | ICD-10-CM | POA: Diagnosis not present

## 2017-12-23 DIAGNOSIS — E785 Hyperlipidemia, unspecified: Secondary | ICD-10-CM | POA: Insufficient documentation

## 2017-12-23 DIAGNOSIS — M1A0720 Idiopathic chronic gout, left ankle and foot, without tophus (tophi): Secondary | ICD-10-CM

## 2017-12-23 MED ORDER — ROSUVASTATIN CALCIUM 5 MG PO TABS: 5.0000 mg | ORAL_TABLET | Freq: Every day | ORAL | 0 refills | Status: DC

## 2017-12-23 MED ORDER — FEBUXOSTAT 40 MG PO TABS: 40.0000 mg | ORAL_TABLET | Freq: Every day | ORAL | 0 refills | Status: DC

## 2017-12-23 NOTE — Progress Notes (Signed)
Name: Fernando Hart   MRN: [id number]    DOB: 08-28-1951   Date:12/23/2017       Progress Note  Subjective  Chief Complaint  Chief Complaint  Patient presents with  . Hypertension    Pt denies any issue   . Gout  . Medication Refill    Bp meds    Hypertension  This is a chronic problem. The problem is unchanged. The problem is controlled. Pertinent negatives include no anxiety, blurred vision, palpitations or shortness of breath. Past treatments include angiotensin blockers and diuretics. There is no history of kidney disease, CAD/MI or CVA.  Hyperlipidemia  This is a chronic problem. The problem is uncontrolled. Recent lipid tests were reviewed and are high. Pertinent negatives include no leg pain, myalgias or shortness of breath. Current antihyperlipidemic treatment includes statins.     Past Medical History:  Diagnosis Date  . Gout   . Hypertension   . Vitamin D deficiency     Past Surgical History:  Procedure Laterality Date  . NO PAST SURGERIES    . ROOT CANAL      Family History  Problem Relation Age of Onset  . Hypertension Mother   . Diabetes Father   . Diabetes Sister     Social History   Socioeconomic History  . Marital status: Single    Spouse name: Not on file  . Number of children: Not on file  . Years of education: Not on file  . Highest education level: Not on file  Social Needs  . Financial resource strain: Not on file  . Food insecurity - worry: Not on file  . Food insecurity - inability: Not on file  . Transportation needs - medical: Not on file  . Transportation needs - non-medical: Not on file  Occupational History  . Not on file  Tobacco Use  . Smoking status: Former Smoker    Types: Cigarettes    Last attempt to quit: 07/2004    Years since quitting: 13.4  . Smokeless tobacco: Never Used  Substance and Sexual Activity  . Alcohol use: Yes    Alcohol/week: 0.0 oz  . Drug use: No  . Sexual activity: Yes    Partners: Female  Other  Topics Concern  . Not on file  Social History Narrative  . Not on file     Current Outpatient Medications:  .  Candesartan Cilexetil-HCTZ 32-25 MG TABS, TAKE 1 TABLET BY MOUTH  DAILY, Disp: 90 each, Rfl: 0 .  Cholecalciferol (VITAMIN D PO), Take 5,000 Units by mouth daily., Disp: , Rfl:  .  febuxostat (ULORIC) 40 MG tablet, Take 1 tablet (40 mg total) by mouth daily., Disp: 90 tablet, Rfl: 0 .  furosemide (LASIX) 20 MG tablet, TAKE 1 TABLET BY MOUTH EVERY DAY (Patient not taking: Reported on 12/23/2017), Disp: 30 tablet, Rfl: 0  No Known Allergies   Review of Systems  Eyes: Negative for blurred vision.  Respiratory: Negative for shortness of breath.   Cardiovascular: Negative for palpitations.  Musculoskeletal: Negative for myalgias.     Objective  Vitals:   12/23/17 1035 12/23/17 1036  BP: (!) 112/56 (!) 112/58  Pulse: 70   Resp: 16   Temp: 98.4 F (36.9 C)   TempSrc: Oral   SpO2: 98%   Weight: 207 lb 12.8 oz (94.3 kg)   Height: _0  (1.702 m)     Physical Exam  Constitutional: He is oriented to person, place, and time and well-developed, well-nourished, and in  no distress.  HENT:  Head: Normocephalic and atraumatic.  Cardiovascular: Normal rate, regular rhythm and normal heart sounds.  No murmur heard. Pulmonary/Chest: Effort normal and breath sounds normal.  Abdominal: Soft. Bowel sounds are normal. There is no tenderness.  Musculoskeletal: He exhibits no edema.  Neurological: He is alert and oriented to person, place, and time.  Psychiatric: Mood, memory, affect and judgment normal.  Nursing note and vitals reviewed.     Recent Results (from the past 2160 hour(s))  CMP14+EGFR     Status: Abnormal   Collection Time: 11/20/17  8:29 AM  Result Value Ref Range   Glucose 80 65 - 99 mg/dL   BUN 13 8 - 27 mg/dL   Creatinine, Ser 1.25 0.76 - 1.27 mg/dL   GFR calc non Af Amer 59 (L) >59 mL/min/1.73   GFR calc Af Amer 68 >59 mL/min/1.73   BUN/Creatinine Ratio  10 10 - 24   Sodium 141 134 - 144 mmol/L   Potassium 4.0 3.5 - 5.2 mmol/L   Chloride 98 96 - 106 mmol/L   CO2 25 20 - 29 mmol/L   Calcium 9.8 8.6 - 10.2 mg/dL   Total Protein 7.3 6.0 - 8.5 g/dL   Albumin 4.3 3.6 - 4.8 g/dL   Globulin, Total 3.0 1.5 - 4.5 g/dL   Albumin/Globulin Ratio 1.4 1.2 - 2.2   Bilirubin Total 0.5 0.0 - 1.2 mg/dL   Alkaline Phosphatase 65 39 - 117 IU/L   AST 40 0 - 40 IU/L   ALT 33 0 - 44 IU/L  Lipid Panel w/o Chol/HDL Ratio     Status: Abnormal   Collection Time: 11/20/17  8:29 AM  Result Value Ref Range   Cholesterol, Total 169 100 - 199 mg/dL   Triglycerides 112 0 - 149 mg/dL   HDL 34 (L) >39 mg/dL   VLDL Cholesterol Cal 22 5 - 40 mg/dL   LDL Calculated 113 (H) 0 - 99 mg/dL  Uric acid     Status: None   Collection Time: 11/20/17  8:29 AM  Result Value Ref Range   Uric Acid 4.8 3.7 - 8.6 mg/dL    Comment:            Therapeutic target for gout patients: <6.0     Assessment & Plan  1. Pure hypercholesterolemia Will start on low-dose statin for primary prevention, educated on potential drug side effects, recheck FLP in 3 months - rosuvastatin (CRESTOR) 5 MG tablet; Take 1 tablet (5 mg total) by mouth daily.  Dispense: 90 tablet; Refill: 0  2. Essential hypertension BP stable on present antihypertensive treatment  3. Idiopathic chronic gout of left foot without tophus Uric acid level is at therapeutic target, continue on Uloric. - febuxostat (ULORIC) 40 MG tablet; Take 1 tablet (40 mg total) by mouth daily.  Dispense: 90 tablet; Refill: 0   Chesnie Capell [facility] 12/23/2017 10:42 AM

## 2017-12-31 ENCOUNTER — Other Ambulatory Visit: Payer: Self-pay

## 2017-12-31 ENCOUNTER — Encounter: Payer: Self-pay | Admitting: *Deleted

## 2017-12-31 ENCOUNTER — Ambulatory Visit
Admission: EM | Admit: 2017-12-31 | Discharge: 2017-12-31 | Disposition: A | Discharge status: Home / Self Care | Payer: Managed Care, Other (non HMO) | Attending: Family Medicine | Admitting: Family Medicine

## 2017-12-31 DIAGNOSIS — M545 Low back pain: Secondary | ICD-10-CM | POA: Diagnosis not present

## 2017-12-31 DIAGNOSIS — M533 Sacrococcygeal disorders, not elsewhere classified: Secondary | ICD-10-CM | POA: Diagnosis not present

## 2017-12-31 MED ORDER — TIZANIDINE HCL 4 MG PO CAPS: 4.0000 mg | ORAL_CAPSULE | Freq: Three times a day (TID) | ORAL | 0 refills | Status: DC

## 2017-12-31 MED ORDER — NAPROXEN 500 MG PO TABS: 500.0000 mg | ORAL_TABLET | Freq: Two times a day (BID) | ORAL | 0 refills | Status: DC

## 2017-12-31 MED ORDER — KETOROLAC TROMETHAMINE 60 MG/2ML IM SOLN
30.0000 mg | Freq: Once | INTRAMUSCULAR | Status: AC
  Administered 2017-12-31: 30 mg via INTRAMUSCULAR

## 2017-12-31 MED ORDER — METHYLPREDNISOLONE 4 MG PO TBPK: ORAL_TABLET | ORAL | 0 refills | Status: DC

## 2017-12-31 NOTE — ED Triage Notes (Signed)
Patient started having symptoms of lower back pain radiating to the left leg 4 days ago. PAtient believes that he hurt it while moving furniture.

## 2017-12-31 NOTE — ED Provider Notes (Signed)
[facility]    CSN: [id number] Arrival date & time: 12/31/17  0813     History   Chief Complaint Chief Complaint  Patient presents with  . Back Pain    HPI Fernando Hart is a 67 y.o. male.   HPI  67 year old male presents with left-sided low back pain with mild radiation over his anterior thigh.  Is moving heavy bed roughly 4 days ago.  2 days following the injury he began to have the pain.  Worsened over time.  He states that he has been taking large amounts of Advil and Aleve with little beneficial relief.  He has no incontinence.  He has injured his back in the past on the opposite side which responded to rest and conservative care.        Past Medical History:  Diagnosis Date  . Gout   . Hypertension   . Vitamin D deficiency     Patient Active Problem List   Diagnosis Date Noted  . Hyperlipidemia 12/23/2017  . Sore throat 07/12/2017  . Tinnitus of right ear 07/12/2017  . Dental implant pain 07/12/2017  . Sciatica without lumbago, right 03/20/2017  . Annual physical exam 02/08/2017  . Need for hepatitis C screening test 02/08/2017  . Colon cancer screening 02/08/2017  . URI with cough and congestion 01/18/2017  . Dyslipidemia 10/11/2016  . Obesity 10/11/2016  . Right leg pain 07/11/2016  . Degenerative joint disease of right hip 02/28/2016  . Prediabetes 01/10/2016  . Gout 01/10/2016  . Essential hypertension 01/10/2016    Past Surgical History:  Procedure Laterality Date  . NO PAST SURGERIES    . ROOT CANAL         Home Medications    Prior to Admission medications   Medication Sig Start Date End Date Taking? Authorizing Provider  Candesartan Cilexetil-HCTZ 32-25 MG TABS TAKE 1 TABLET BY MOUTH  DAILY 12/11/17  Yes Roselee Nova, MD  Cholecalciferol (VITAMIN D PO) Take 5,000 Units by mouth daily.   Yes [provider]  febuxostat (ULORIC) 40 MG tablet Take 1 tablet (40 mg total) by mouth daily. 12/23/17 03/23/18 Yes Roselee Nova, MD  methylPREDNISolone (MEDROL DOSEPAK) 4 MG TBPK tablet Take per package instructions 12/31/17   Lorin Picket, PA-C  naproxen (NAPROSYN) 500 MG tablet Take 1 tablet (500 mg total) by mouth 2 (two) times daily with a meal. 12/31/17   Lorin Picket, PA-C  rosuvastatin (CRESTOR) 5 MG tablet Take 1 tablet (5 mg total) by mouth daily. 12/23/17   Roselee Nova, MD  tiZANidine (ZANAFLEX) 4 MG capsule Take 1 capsule (4 mg total) by mouth 3 (three) times daily. 12/31/17   Lorin Picket, PA-C    Family History Family History  Problem Relation Age of Onset  . Hypertension Mother   . Diabetes Father   . Diabetes Sister     Social History Social History   Tobacco Use  . Smoking status: Former Smoker    Types: Cigarettes    Last attempt to quit: 07/2004    Years since quitting: 13.4  . Smokeless tobacco: Never Used  Substance Use Topics  . Alcohol use: Yes    Alcohol/week: 0.0 oz  . Drug use: No     Allergies   Patient has no known allergies.   Review of Systems Review of Systems   Physical Exam Triage Vital Signs ED Triage Vitals  Enc Vitals Group  BP 12/31/17 0834 (!) 151/76     Pulse Rate 12/31/17 0834 86     Resp 12/31/17 0834 18     Temp 12/31/17 0834 98.9 F (37.2 C)     Temp Source 12/31/17 0834 Oral     SpO2 12/31/17 0834 97 %     Weight --      Height --      Head Circumference --      Peak Flow --      Pain Score 12/31/17 0835 8     Pain Loc --      Pain Edu? --      Excl. in [place]? --    No data found.  Updated Vital Signs BP (!) 151/76 (BP Location: Left Arm)   Pulse 86   Temp 98.9 F (37.2 C) (Oral)   Resp 18   SpO2 97%   Visual Acuity Right Eye Distance:   Left Eye Distance:   Bilateral Distance:    Right Eye Near:   Left Eye Near:    Bilateral Near:     Physical Exam  Constitutional: He is oriented to person, place, and time. He appears well-developed and well-nourished. No distress.  HENT:  Head:  Normocephalic.  Eyes: Pupils are equal, round, and reactive to light.  Neck: Normal range of motion.  Musculoskeletal: He exhibits tenderness.  Examination lumbar spine shows the patient to have a elevated pelvis in stance on the left.  Able to toe and heel walk.  She is intact to light touch throughout the lower extremities.  EHL  peroneal and anterior tibialis muscles are strong to clinical testing.  DTRs are 2+/4 bilaterally symmetrical.  Great leg raise testing in the seated position is negative at 90 degrees on the right and positive at 80 degrees but able to stand and 90 degrees on the left.  And is localized to the low back sacroiliac area with straight leg raise testing.  Maximum tenderness is localized over the superior iliac crest posteriorly and along the sacroiliac joint.  Neurological: He is alert and oriented to person, place, and time.  Skin: Skin is warm and dry. He is not diaphoretic.  Psychiatric: He has a normal mood and affect. His behavior is normal. Judgment and thought content normal.  Nursing note and vitals reviewed.    UC Treatments / Results  Labs (all labs ordered are listed, but only abnormal results are displayed) Labs Reviewed - No data to display  EKG  EKG Interpretation None       Radiology No results found.  Procedures Procedures (including critical care time)  Medications Ordered in UC Medications  ketorolac (TORADOL) injection 30 mg (30 mg Intramuscular Given 12/31/17 0903)     Initial Impression / Assessment and Plan / UC Course  I have reviewed the triage vital signs and the nursing notes.  Pertinent labs & imaging results that were available during my care of the patient were reviewed by me and considered in my medical decision making (see chart for details).     Plan: 1. Test/x-ray results and diagnosis reviewed with patient 2. rx as per orders; risks, benefits, potential side effects reviewed with patient 3. Recommend supportive  treatment with rest and symptom avoidance.  Avoid sitting lifting or bending.  Commend remaining active by walking.  Start with prednisone short course as well as Naprosyn and Zanaflex.  If he is not improving should follow-up with a primary care physician. 4. F/u prn if symptoms worsen or  don't improve   Final Clinical Impressions(s) / UC Diagnoses   Final diagnoses:  Sacroiliac joint dysfunction of left side    ED Discharge Orders        Ordered    methylPREDNISolone (MEDROL DOSEPAK) 4 MG TBPK tablet     12/31/17 0918    naproxen (NAPROSYN) 500 MG tablet  2 times daily with meals     12/31/17 0918    tiZANidine (ZANAFLEX) 4 MG capsule  3 times daily     12/31/17 0918       Controlled Substance Prescriptions [place] Controlled Substance Registry consulted? Not Applicable   Lorin Picket, PA-C 12/31/17 1014

## 2018-01-03 ENCOUNTER — Telehealth: Payer: Self-pay | Admitting: *Deleted

## 2018-01-03 NOTE — Telephone Encounter (Signed)
Called patient, no answer, unable to leave message.

## 2018-03-12 IMAGING — CR DG CHEST 2V
1 series · 2 of 2 positions shown · non-contrast
Comparison: None.

CLINICAL DATA: Bilateral ankle edema.

EXAM:
CHEST  2 VIEW

[Series 1: dg chest 2 view · 0.14mm/px · 2 of 2 slices shown]
[im 1/2]
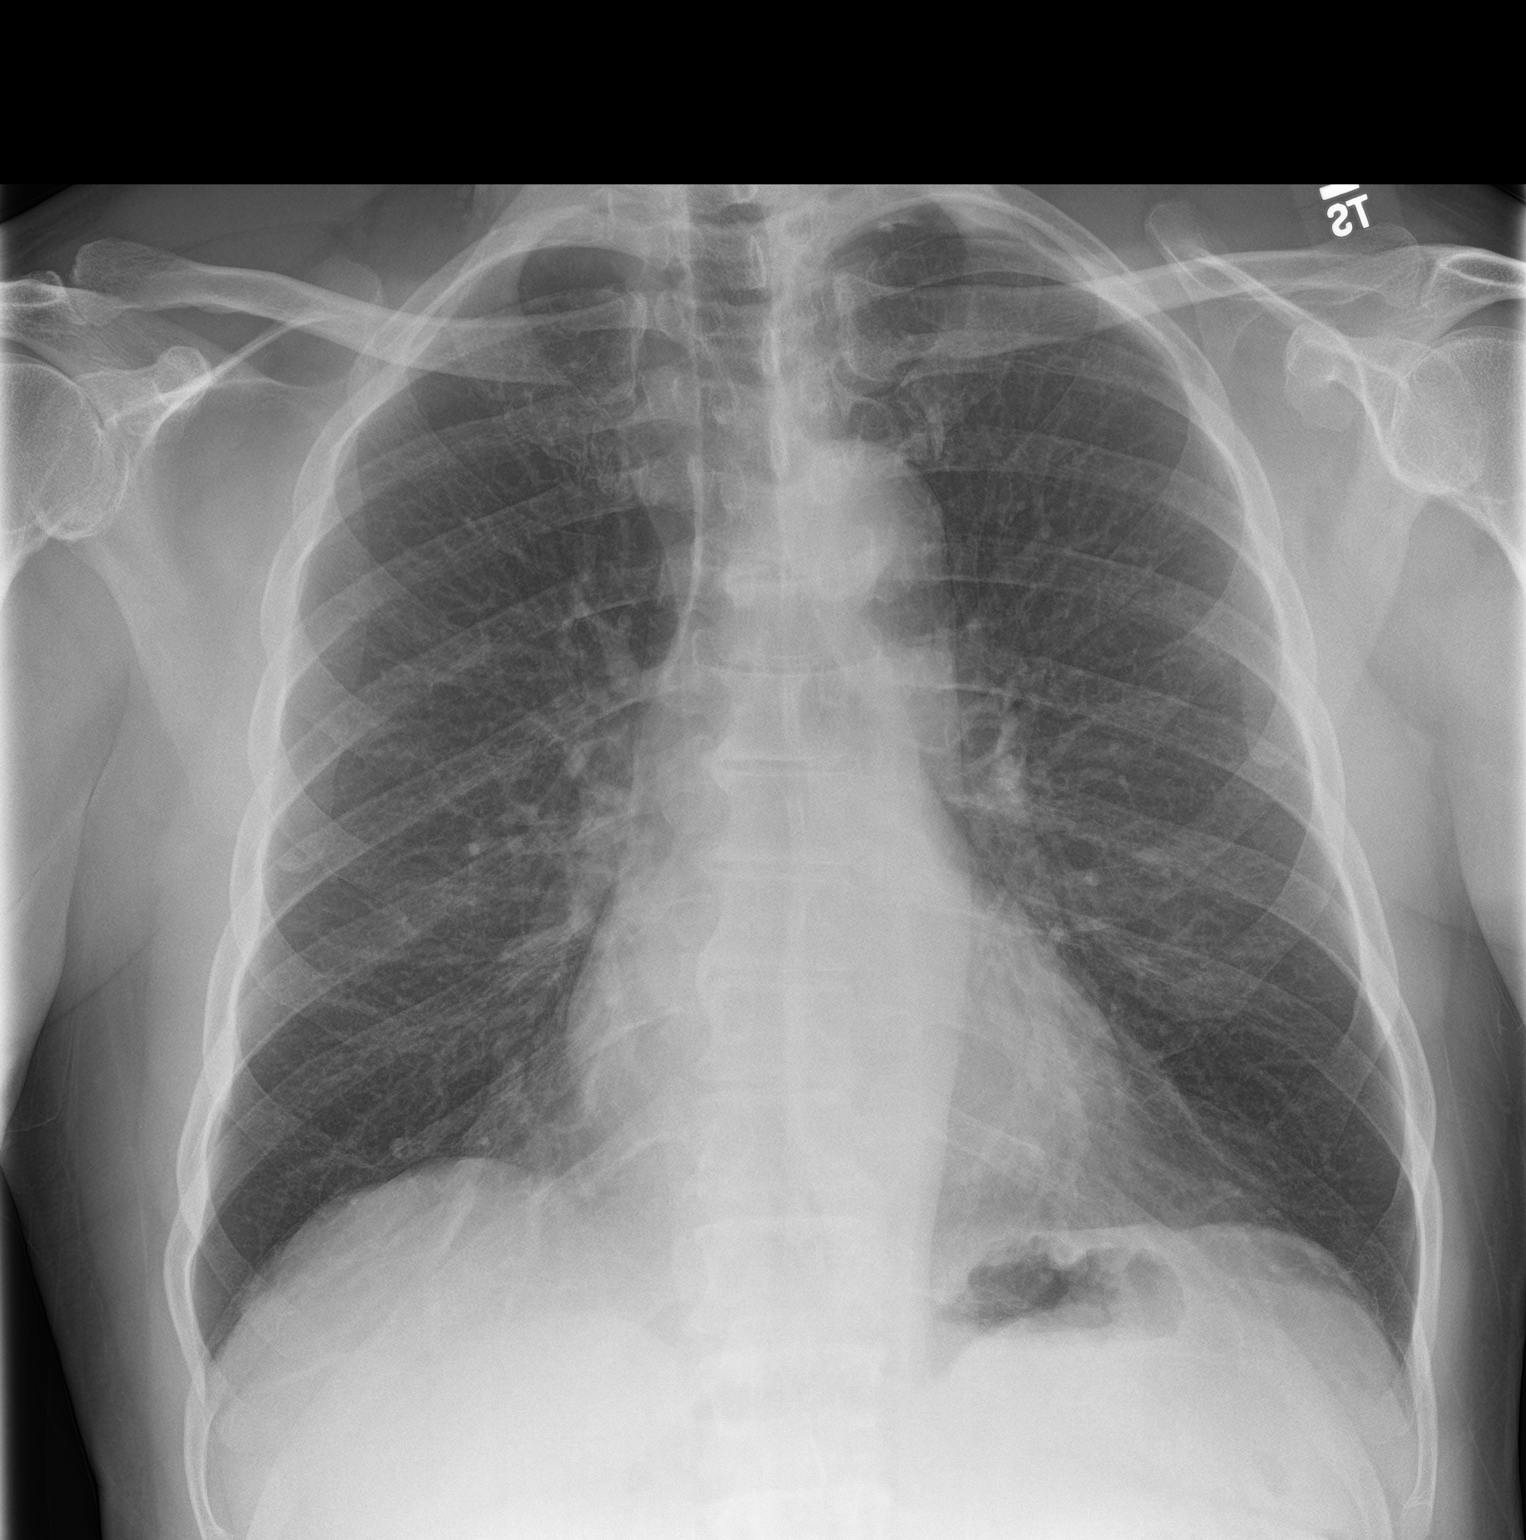
[im 2/2]
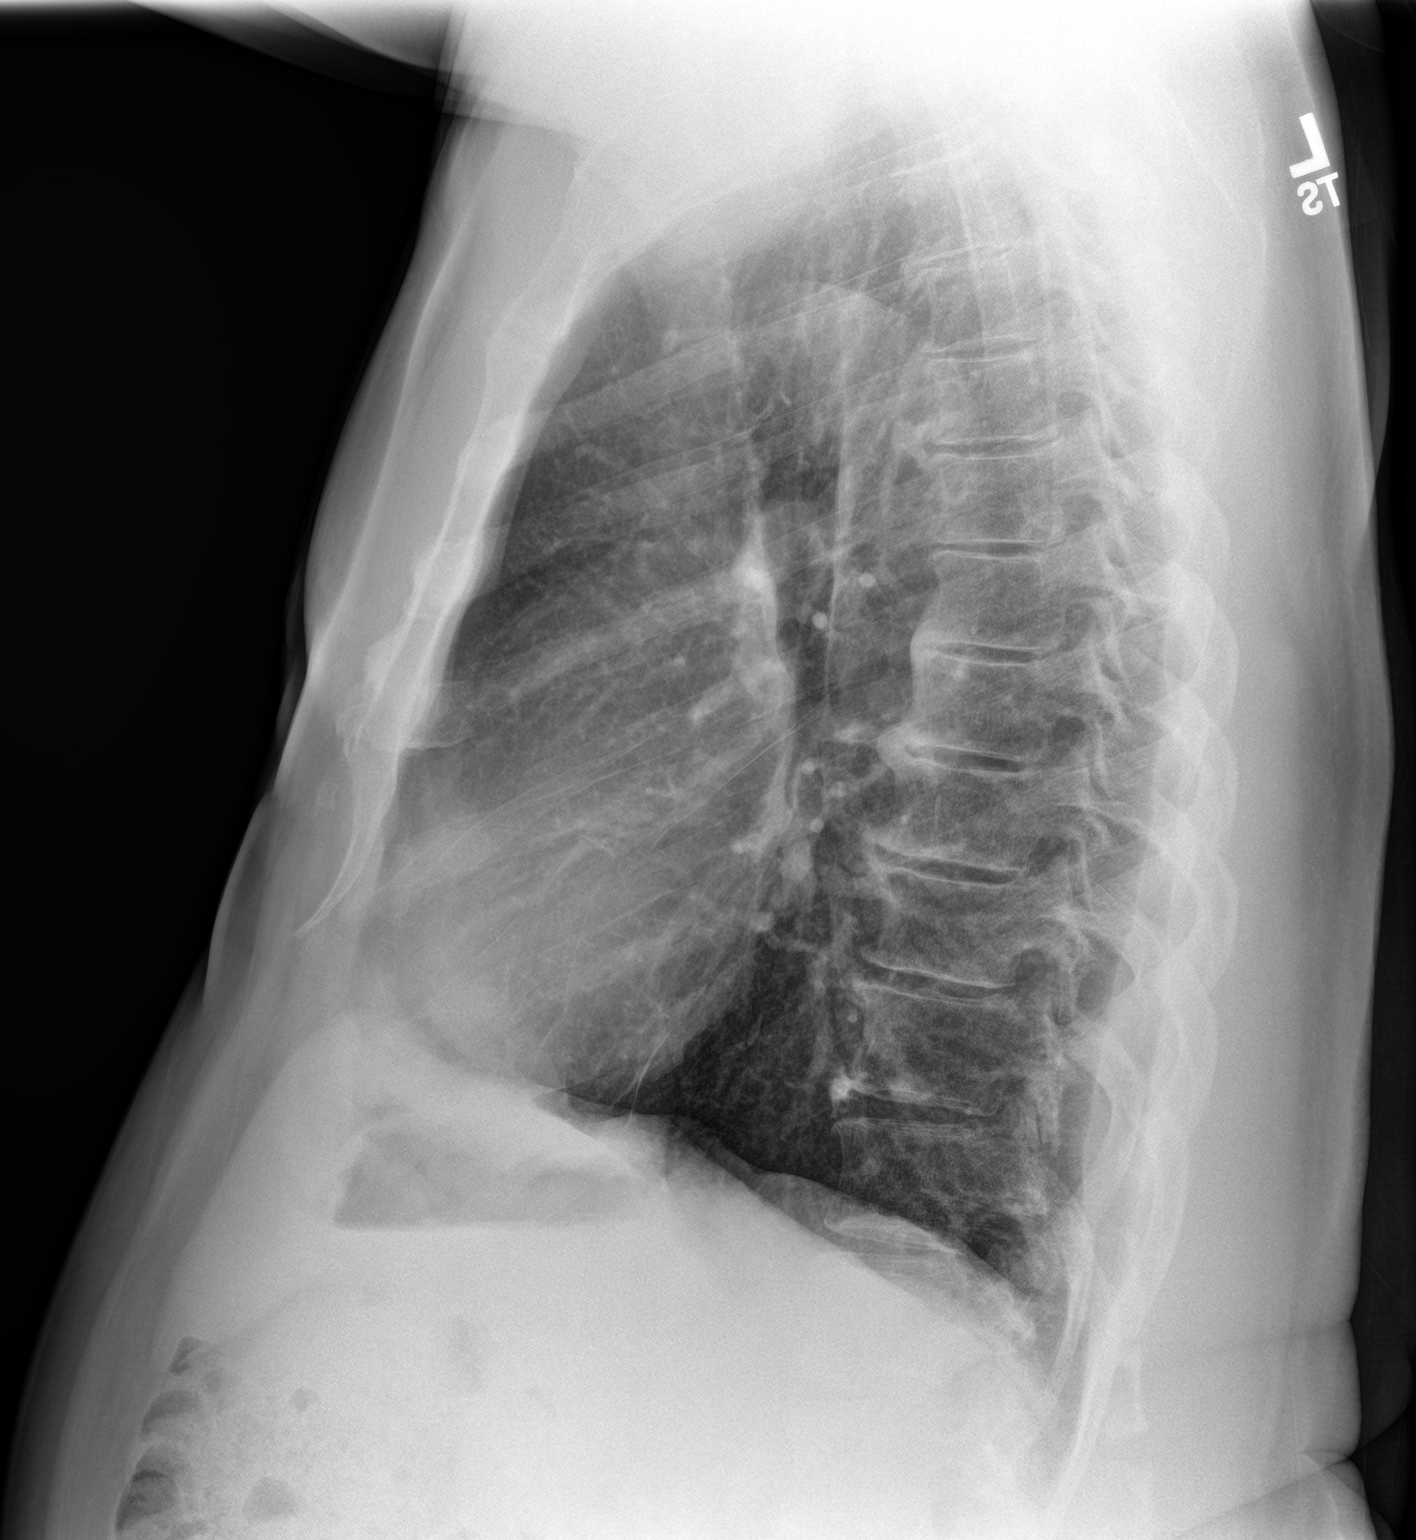

[2 of 2 positions shown; findings below may reference images not displayed]

FINDINGS: The heart size and mediastinal contours are within normal limits.
Both lungs are clear. No pneumothorax or pleural effusion is noted.
The visualized skeletal structures are unremarkable.
IMPRESSION: No active cardiopulmonary disease.

## 2018-03-24 ENCOUNTER — Ambulatory Visit: Payer: Managed Care, Other (non HMO) | Admitting: Family Medicine

## 2018-03-24 ENCOUNTER — Encounter: Payer: Self-pay | Admitting: Family Medicine

## 2018-03-24 VITALS — BP 120/70 | HR 78 | Resp 16 | Ht 67.0 in | Wt 211.6 lb

## 2018-03-24 DIAGNOSIS — I10 Essential (primary) hypertension: Secondary | ICD-10-CM

## 2018-03-24 DIAGNOSIS — R7303 Prediabetes: Secondary | ICD-10-CM

## 2018-03-24 DIAGNOSIS — M1A0720 Idiopathic chronic gout, left ankle and foot, without tophus (tophi): Secondary | ICD-10-CM

## 2018-03-24 DIAGNOSIS — M545 Low back pain, unspecified: Secondary | ICD-10-CM | POA: Insufficient documentation

## 2018-03-24 DIAGNOSIS — E559 Vitamin D deficiency, unspecified: Secondary | ICD-10-CM

## 2018-03-24 DIAGNOSIS — L918 Other hypertrophic disorders of the skin: Secondary | ICD-10-CM | POA: Diagnosis not present

## 2018-03-24 DIAGNOSIS — E785 Hyperlipidemia, unspecified: Secondary | ICD-10-CM | POA: Diagnosis not present

## 2018-03-24 LAB — POCT GLYCOSYLATED HEMOGLOBIN (HGB A1C): Hemoglobin A1C: 6.4

## 2018-03-24 MED ORDER — FEBUXOSTAT 40 MG PO TABS: 40.0000 mg | ORAL_TABLET | Freq: Every day | ORAL | 1 refills | Status: DC

## 2018-03-24 MED ORDER — CANDESARTAN CILEXETIL-HCTZ 32-25 MG PO TABS: 1.0000 | ORAL_TABLET | Freq: Every day | ORAL | 1 refills | Status: DC

## 2018-03-24 MED ORDER — METFORMIN HCL ER 500 MG PO TB24: 500.0000 mg | ORAL_TABLET | Freq: Every day | ORAL | 1 refills | Status: DC

## 2018-03-24 NOTE — Patient Instructions (Signed)
Diabetes Mellitus and Nutrition When you have diabetes (diabetes mellitus), it is very important to have healthy eating habits because your blood sugar (glucose) levels are greatly affected by what you eat and drink. Eating healthy foods in the appropriate amounts, at about the same times every day, can help you:  Control your blood glucose.  Lower your risk of heart disease.  Improve your blood pressure.  Reach or maintain a healthy weight.  Every person with diabetes is different, and each person has different needs for a meal plan. Your health care provider may recommend that you work with a diet and nutrition specialist (dietitian) to make a meal plan that is best for you. Your meal plan may vary depending on factors such as:  The calories you need.  The medicines you take.  Your weight.  Your blood glucose, blood pressure, and cholesterol levels.  Your activity level.  Other health conditions you have, such as heart or kidney disease.  How do carbohydrates affect me? Carbohydrates affect your blood glucose level more than any other type of food. Eating carbohydrates naturally increases the amount of glucose in your blood. Carbohydrate counting is a method for keeping track of how many carbohydrates you eat. Counting carbohydrates is important to keep your blood glucose at a healthy level, especially if you use insulin or take certain oral diabetes medicines. It is important to know how many carbohydrates you can safely have in each meal. This is different for every person. Your dietitian can help you calculate how many carbohydrates you should have at each meal and for snack. Foods that contain carbohydrates include:  Bread, cereal, rice, pasta, and crackers.  Potatoes and corn.  Peas, beans, and lentils.  Milk and yogurt.  Fruit and juice.  Desserts, such as cakes, cookies, ice cream, and candy.  How does alcohol affect me? Alcohol can cause a sudden decrease in blood  glucose (hypoglycemia), especially if you use insulin or take certain oral diabetes medicines. Hypoglycemia can be a life-threatening condition. Symptoms of hypoglycemia (sleepiness, dizziness, and confusion) are similar to symptoms of having too much alcohol. If your health care provider says that alcohol is safe for you, follow these guidelines:  Limit alcohol intake to no more than 1 drink per day for nonpregnant women and 2 drinks per day for men. One drink equals 12 oz of beer, 5 oz of wine, or 1 oz of hard liquor.  Do not drink on an empty stomach.  Keep yourself hydrated with water, diet soda, or unsweetened iced tea.  Keep in mind that regular soda, juice, and other mixers may contain a lot of sugar and must be counted as carbohydrates.  What are tips for following this plan? Reading food labels  Start by checking the serving size on the label. The amount of calories, carbohydrates, fats, and other nutrients listed on the label are based on one serving of the food. Many foods contain more than one serving per package.  Check the total grams (g) of carbohydrates in one serving. You can calculate the number of servings of carbohydrates in one serving by dividing the total carbohydrates by 15. For example, if a food has 30 g of total carbohydrates, it would be equal to 2 servings of carbohydrates.  Check the number of grams (g) of saturated and trans fats in one serving. Choose foods that have low or no amount of these fats.  Check the number of milligrams (mg) of sodium in one serving. Most people   should limit total sodium intake to less than 2,300 mg per day.  Always check the nutrition information of foods labeled as "low-fat" or "nonfat". These foods may be higher in added sugar or refined carbohydrates and should be avoided.  Talk to your dietitian to identify your daily goals for nutrients listed on the label. Shopping  Avoid buying canned, premade, or processed foods. These  foods tend to be high in fat, sodium, and added sugar.  Shop around the outside edge of the grocery store. This includes fresh fruits and vegetables, bulk grains, fresh meats, and fresh dairy. Cooking  Use low-heat cooking methods, such as baking, instead of high-heat cooking methods like deep frying.  Cook using healthy oils, such as olive, canola, or sunflower oil.  Avoid cooking with butter, cream, or high-fat meats. Meal planning  Eat meals and snacks regularly, preferably at the same times every day. Avoid going long periods of time without eating.  Eat foods high in fiber, such as fresh fruits, vegetables, beans, and whole grains. Talk to your dietitian about how many servings of carbohydrates you can eat at each meal.  Eat 4-6 ounces of lean protein each day, such as lean meat, chicken, fish, eggs, or tofu. 1 ounce is equal to 1 ounce of meat, chicken, or fish, 1 egg, or 1/4 cup of tofu.  Eat some foods each day that contain healthy fats, such as avocado, nuts, seeds, and fish. Lifestyle   Check your blood glucose regularly.  Exercise at least 30 minutes 5 or more days each week, or as told by your health care provider.  Take medicines as told by your health care provider.  Do not use any products that contain nicotine or tobacco, such as cigarettes and e-cigarettes. If you need help quitting, ask your health care provider.  Work with a counselor or diabetes educator to identify strategies to manage stress and any emotional and social challenges. What are some questions to ask my health care provider?  Do I need to meet with a diabetes educator?  Do I need to meet with a dietitian?  What number can I call if I have questions?  When are the best times to check my blood glucose? Where to find more information:  American Diabetes Association: diabetes.org/food-and-fitness/food  Academy of Nutrition and Dietetics:  www.eatright.org/resources/health/diseases-and-conditions/diabetes  National Institute of Diabetes and Digestive and Kidney Diseases (NIH): www.niddk.nih.gov/health-information/diabetes/overview/diet-eating-physical-activity Summary  A healthy meal plan will help you control your blood glucose and maintain a healthy lifestyle.  Working with a diet and nutrition specialist (dietitian) can help you make a meal plan that is best for you.  Keep in mind that carbohydrates and alcohol have immediate effects on your blood glucose levels. It is important to count carbohydrates and to use alcohol carefully. This information is not intended to replace advice given to you by your health care provider. Make sure you discuss any questions you have with your health care provider. Document Released: 08/30/2005 Document Revised: 01/07/2017 Document Reviewed: 01/07/2017 Elsevier Interactive Patient Education  2018 Elsevier Inc.  

## 2018-03-24 NOTE — Progress Notes (Signed)
Name: Fernando Hart   MRN: [id number]    DOB: 06/14/1951   Date:03/24/2018       Progress Note  Subjective  Chief Complaint  Chief Complaint  Patient presents with  . Medication Refill    3 month F/U  . Hypertension  . Hyperlipidemia    Giving muscle cramps would like to discuss medication    HPI  HTN: he is compliant with bp medication, he denies chest pain or palpitation  Hyperlipidemia: he states he is afraid of taking crestor, and has not been compliant with medication, last LDL was 100, we will recheck labs and decide if he needs to go back on it  Hyperglycemia: he has noticed post-prandial fatigue. He has episodes of feeling very hungry but denies polydipsia or polyuria. He is willing to try metformin   Gout: controlled since started on Uloric last year, we will recheck uric acid  Obesity: he has multiple co-morbidities. He has gained over  60 lbs over the past 15 years, after he quit smoking. Discussed life style modification   Patient Active Problem List   Diagnosis Date Noted  . Intermittent low back pain 03/24/2018  . Hyperlipidemia 12/23/2017  . Tinnitus of right ear 07/12/2017  . Dyslipidemia 10/11/2016  . Obesity 10/11/2016  . Degenerative joint disease of right hip 02/28/2016  . Prediabetes 01/10/2016  . Gout 01/10/2016  . Essential hypertension 01/10/2016    Past Surgical History:  Procedure Laterality Date  . NO PAST SURGERIES    . ROOT CANAL      Family History  Problem Relation Age of Onset  . Hypertension Mother   . Diabetes Father   . Diabetes Sister     Social History   Socioeconomic History  . Marital status: Divorced    Spouse name: Not on file  . Number of children: 3  . Years of education: Not on file  . Highest education level: Associate degree: occupational, Hotel manager, or vocational program  Occupational History  . Occupation: IT   Social Needs  . Financial resource strain: Not hard at all  . Food insecurity:    Worry: Never  true    Inability: Never true  . Transportation needs:    Medical: No    Non-medical: No  Tobacco Use  . Smoking status: Former Smoker    Packs/day: 1.00    Years: 35.00    Pack years: 35.00    Types: Cigarettes    Last attempt to quit: 07/2004    Years since quitting: 13.6  . Smokeless tobacco: Never Used  Substance and Sexual Activity  . Alcohol use: Yes    Alcohol/week: 0.6 oz    Types: 1 Shots of liquor per week    Comment: not every week  . Drug use: Yes  . Sexual activity: Yes    Partners: Female    Comment: partner is post-menopausal  Lifestyle  . Physical activity:    Days per week: 0 days    Minutes per session: 0 min  . Stress: Not at all  Relationships  . Social connections:    Talks on phone: Not on file    Gets together: Not on file    Attends religious service: Not on file    Active member of club or organization: Not on file    Attends meetings of clubs or organizations: Not on file    Relationship status: Not on file  . Intimate partner violence:    Fear of current or ex partner:  Not on file    Emotionally abused: Not on file    Physically abused: Not on file    Forced sexual activity: Not on file  Other Topics Concern  . Not on file  Social History Narrative   Divorced many years ago, dating same person for 25 years she lives in [place] but they thinking about getting married   He has 3 grown children.    8 grandchildren.    One daughter in [place] area the others in [place]     Current Outpatient Medications:  .  Candesartan Cilexetil-HCTZ 32-25 MG TABS, Take 1 tablet by mouth daily., Disp: 90 each, Rfl: 1 .  Cholecalciferol (VITAMIN D PO), Take 1,000 Units by mouth daily., Disp: , Rfl:  .  febuxostat (ULORIC) 40 MG tablet, Take 1 tablet (40 mg total) by mouth daily., Disp: 90 tablet, Rfl: 1 .  metFORMIN (GLUCOPHAGE-XR) 500 MG 24 hr tablet, Take 1 tablet (500 mg total) by mouth daily with breakfast., Disp: 90 tablet, Rfl: 1  No Known  Allergies   ROS  Constitutional: Negative for fever or weight change.  Respiratory: Negative for cough and shortness of breath.   Cardiovascular: Negative for chest pain or palpitations.  Gastrointestinal: Negative for abdominal pain, no bowel changes.  Musculoskeletal: Negative for gait problem or joint swelling.  Skin: Negative for rash.  Neurological: Negative for dizziness or headache.  No other specific complaints in a complete review of systems (except as listed in HPI above).  Objective  Vitals:   03/24/18 0933  BP: 120/70  Pulse: 78  Resp: 16  SpO2: 96%  Weight: 211 lb 9.6 oz (96 kg)  Height: 5\' 7"  (1.702 m)    Body mass index is 33.14 kg/m.  Physical Exam  Constitutional: Patient appears well-developed and well-nourished. Obese No distress.  HEENT: head atraumatic, normocephalic, pupils equal and reactive to light,  neck supple, throat within normal limits Cardiovascular: Normal rate, regular rhythm and normal heart sounds.  No murmur heard. No BLE edema. Pulmonary/Chest: Effort normal and breath sounds normal. No respiratory distress. Abdominal: Soft.  There is no tenderness. Psychiatric: Patient has a normal mood and affect. behavior is normal. Judgment and thought content normal. Skin: multiple skin tags  Recent Results (from the past 2160 hour(s))  POCT HgB A1C     Status: Abnormal   Collection Time: 03/24/18  9:36 AM  Result Value Ref Range   Hemoglobin A1C 6.4       PHQ2/9: Depression screen [facility] 2/9 12/23/2017 08/26/2017 07/16/2017 05/08/2017 03/20/2017  Decreased Interest 0 0 0 0 0  Down, Depressed, Hopeless 0 0 0 0 0  PHQ - 2 Score 0 0 0 0 0     Fall Risk: Fall Risk  03/24/2018 12/23/2017 08/26/2017 07/16/2017 05/08/2017  Falls in the past year? No No No No No  Number falls in past yr: - - - - -  Injury with Fall? - - - - -  Follow up - - - - -     Functional Status Survey: Is the patient deaf or have difficulty hearing?: No Does the patient have  difficulty seeing, even when wearing glasses/contacts?: No Does the patient have difficulty concentrating, remembering, or making decisions?: No Does the patient have difficulty walking or climbing stairs?: No Does the patient have difficulty dressing or bathing?: No Does the patient have difficulty doing errands alone such as visiting a doctor's office or shopping?: No  Assessment & Plan  1. Prediabetes  - POCT  HgB A1C - metFORMIN (GLUCOPHAGE-XR) 500 MG 24 hr tablet; Take 1 tablet (500 mg total) by mouth daily with breakfast.  Dispense: 90 tablet; Refill: 1  2. Morbid obesity ([place])  Discussed with the patient the risk posed by an increased BMI. Discussed importance of portion control, calorie counting and at least 150 minutes of physical activity weekly. Avoid sweet beverages and drink more water. Eat at least 6 servings of fruit and vegetables daily   3. Intermittent low back pain  Doing well at this time  4. Essential hypertension  He would like to return for EKG - he is on call today  - COMPLETE METABOLIC PANEL WITH GFR - Comprehensive metabolic panel - Candesartan Cilexetil-HCTZ 32-25 MG TABS; Take 1 tablet by mouth daily.  Dispense: 90 each; Refill: 1  5. Idiopathic chronic gout of left foot without tophus  - Uric acid - febuxostat (ULORIC) 40 MG tablet; Take 1 tablet (40 mg total) by mouth daily.  Dispense: 90 tablet; Refill: 1  6. Dyslipidemia  - Lipid panel  7. Vitamin D deficiency  - VITAMIN D 25 Hydroxy (Vit-D Deficiency, Fractures)  8. Skin tag  reassurance

## 2018-04-02 ENCOUNTER — Ambulatory Visit (INDEPENDENT_AMBULATORY_CARE_PROVIDER_SITE_OTHER): Payer: Managed Care, Other (non HMO)

## 2018-04-02 VITALS — BP 120/70 | Ht 67.0 in | Wt 211.0 lb

## 2018-04-02 DIAGNOSIS — I10 Essential (primary) hypertension: Secondary | ICD-10-CM | POA: Diagnosis not present

## 2018-04-02 NOTE — Progress Notes (Signed)
Patient is here for nurse visit EKG. Patient tolerated EKG well. EKG was viewed by PCP.

## 2018-05-10 LAB — COMPREHENSIVE METABOLIC PANEL
A/G RATIO: 1.5 (ref 1.2–2.2)
ALK PHOS: 55 IU/L (ref 39–117)
ALT: 25 IU/L (ref 0–44)
AST: 29 IU/L (ref 0–40)
Albumin: 4 g/dL (ref 3.6–4.8)
BILIRUBIN TOTAL: 0.6 mg/dL (ref 0.0–1.2)
BUN/Creatinine Ratio: 11 (ref 10–24)
BUN: 15 mg/dL (ref 8–27)
CHLORIDE: 102 mmol/L (ref 96–106)
CO2: 22 mmol/L (ref 20–29)
Calcium: 9.7 mg/dL (ref 8.6–10.2)
Creatinine, Ser: 1.31 mg/dL — ABNORMAL HIGH (ref 0.76–1.27)
GFR calc Af Amer: 65 mL/min/{1.73_m2} (ref >59)
GFR calc non Af Amer: 56 mL/min/{1.73_m2} — ABNORMAL LOW (ref >59)
Globulin, Total: 2.6 g/dL (ref 1.5–4.5)
Glucose: 80 mg/dL (ref 65–99)
POTASSIUM: 3.9 mmol/L (ref 3.5–5.2)
Sodium: 139 mmol/L (ref 134–144)
Total Protein: 6.6 g/dL (ref 6.0–8.5)

## 2018-05-10 LAB — LIPID PANEL
CHOLESTEROL TOTAL: 140 mg/dL (ref 100–199)
Chol/HDL Ratio: 4.5 ratio (ref 0.0–5.0)
HDL: 31 mg/dL — AB (ref >39)
LDL CALC: 87 mg/dL (ref 0–99)
TRIGLYCERIDES: 108 mg/dL (ref 0–149)
VLDL CHOLESTEROL CAL: 22 mg/dL (ref 5–40)

## 2018-05-10 LAB — URIC ACID: URIC ACID: 4.8 mg/dL (ref 3.7–8.6)

## 2018-05-10 LAB — VITAMIN D 25 HYDROXY (VIT D DEFICIENCY, FRACTURES): VIT D 25 HYDROXY: 39.8 ng/mL (ref 30.0–100.0)

## 2018-05-23 ENCOUNTER — Ambulatory Visit: Payer: Managed Care, Other (non HMO) | Admitting: Nurse Practitioner

## 2018-07-08 ENCOUNTER — Ambulatory Visit: Payer: Managed Care, Other (non HMO) | Admitting: Family Medicine

## 2018-07-16 ENCOUNTER — Other Ambulatory Visit: Payer: Self-pay | Admitting: Family Medicine

## 2018-07-16 DIAGNOSIS — R7303 Prediabetes: Secondary | ICD-10-CM

## 2018-07-31 LAB — LIPID PANEL
CHOLESTEROL: 173 (ref 0–200)
HDL: 34 — AB (ref 35–70)
LDL Cholesterol: 114
TRIGLYCERIDES: 123 (ref 40–160)

## 2018-07-31 LAB — BASIC METABOLIC PANEL: GLUCOSE: 94

## 2018-07-31 LAB — HEMOGLOBIN A1C: Hemoglobin A1C: 5.7 % — AB (ref 4.0–5.6)

## 2018-09-20 ENCOUNTER — Other Ambulatory Visit: Payer: Self-pay | Admitting: Family Medicine

## 2018-09-20 DIAGNOSIS — I10 Essential (primary) hypertension: Secondary | ICD-10-CM

## 2018-09-23 ENCOUNTER — Encounter: Payer: Self-pay | Admitting: Family Medicine

## 2018-09-23 ENCOUNTER — Ambulatory Visit: Payer: Managed Care, Other (non HMO) | Admitting: Family Medicine

## 2018-09-23 VITALS — BP 114/68 | HR 86 | Temp 98.1°F | Resp 16 | Ht 67.0 in | Wt 197.3 lb

## 2018-09-23 DIAGNOSIS — R7303 Prediabetes: Secondary | ICD-10-CM

## 2018-09-23 DIAGNOSIS — I10 Essential (primary) hypertension: Secondary | ICD-10-CM | POA: Diagnosis not present

## 2018-09-23 DIAGNOSIS — Z23 Encounter for immunization: Secondary | ICD-10-CM | POA: Diagnosis not present

## 2018-09-23 DIAGNOSIS — M1A0720 Idiopathic chronic gout, left ankle and foot, without tophus (tophi): Secondary | ICD-10-CM

## 2018-09-23 DIAGNOSIS — R600 Localized edema: Secondary | ICD-10-CM

## 2018-09-23 DIAGNOSIS — L600 Ingrowing nail: Secondary | ICD-10-CM

## 2018-09-23 MED ORDER — FEBUXOSTAT 40 MG PO TABS: 40.0000 mg | ORAL_TABLET | Freq: Every day | ORAL | 1 refills | Status: DC

## 2018-09-23 MED ORDER — CANDESARTAN CILEXETIL-HCTZ 32-25 MG PO TABS: 1.0000 | ORAL_TABLET | Freq: Every day | ORAL | 1 refills | Status: DC

## 2018-09-23 MED ORDER — METFORMIN HCL ER 500 MG PO TB24: 500.0000 mg | ORAL_TABLET | Freq: Every day | ORAL | 1 refills | Status: DC

## 2018-09-23 NOTE — Progress Notes (Signed)
Name: Fernando Hart   MRN: [id number]    DOB: [birth year]   Date:09/23/2018       Progress Note  Subjective  Chief Complaint  Chief Complaint  Patient presents with  . Medication Refill  . Hypertension    Edema in feet and ankles  . Hyperlipidemia  . Gout    HPI  HTN: he is compliant with bp medication, he denies chest pain or palpitation, bp is at goal, mild ankle edema.   Hyperlipidemia: he states he is afraid of taking crestor, and has not been compliant with medication, reviewed labs done at work.   Hyperglycemia: He has episodes of feeling very hungry but denies polydipsia or polyuria. He is taking Metformin and changed diet, hgbA1C was 6.4% and is down to 5.7% and he has lost weight, continue medication   Gout: controlled since started on Uloric last year, last uric acid was normal, continue medication   Obesity: he has multiple co-morbidities. He lost 14 lbs past 6 months. He has been walking daily and eating healthy, stopped drinking beer on a regular basis.   Patient Active Problem List   Diagnosis Date Noted  . Intermittent low back pain 03/24/2018  . Hyperlipidemia 12/23/2017  . Tinnitus of right ear 07/12/2017  . Dyslipidemia 10/11/2016  . Obesity 10/11/2016  . Degenerative joint disease of right hip 02/28/2016  . Prediabetes 01/10/2016  . Gout 01/10/2016  . Essential hypertension 01/10/2016    Past Surgical History:  Procedure Laterality Date  . NO PAST SURGERIES    . ROOT CANAL      Family History  Problem Relation Age of Onset  . Hypertension Mother   . Diabetes Father   . Diabetes Sister   . Sudden death Daughter     Social History   Socioeconomic History  . Marital status: Divorced    Spouse name: Not on file  . Number of children: 3  . Years of education: Not on file  . Highest education level: Associate degree: occupational, Hotel manager, or vocational program  Occupational History  . Occupation: IT   Social Needs  . Financial  resource strain: Not hard at all  . Food insecurity:    Worry: Never true    Inability: Never true  . Transportation needs:    Medical: No    Non-medical: No  Tobacco Use  . Smoking status: Former Smoker    Packs/day: 1.00    Years: 35.00    Pack years: 35.00    Types: Cigarettes    Last attempt to quit: 07/2004    Years since quitting: 14.1  . Smokeless tobacco: Never Used  Substance and Sexual Activity  . Alcohol use: Yes    Alcohol/week: 1.0 standard drinks    Types: 1 Shots of liquor per week    Comment: not every week  . Drug use: Yes  . Sexual activity: Yes    Partners: Female    Comment: partner is post-menopausal  Lifestyle  . Physical activity:    Days per week: 5 days    Minutes per session: 40 min  . Stress: Not at all  Relationships  . Social connections:    Talks on phone: More than three times a week    Gets together: More than three times a week    Attends religious service: Never    Active member of club or organization: No    Attends meetings of clubs or organizations: Never    Relationship status: Divorced  . Intimate  partner violence:    Fear of current or ex partner: No    Emotionally abused: No    Physically abused: No    Forced sexual activity: No  Other Topics Concern  . Not on file  Social History Narrative   Divorced many years ago, dating same person for 25 years she lives in [place] but they thinking about getting married   He had  3 grown children, youngest daughter died suddenly at age 51 from unknown at her gym .    8 grandchildren.    Two daughters living in [place]     Current Outpatient Medications:  .  Candesartan Cilexetil-HCTZ 32-25 MG TABS, Take 1 tablet by mouth daily., Disp: 90 each, Rfl: 1 .  Cholecalciferol (VITAMIN D PO), Take 1,000 Units by mouth daily., Disp: , Rfl:  .  metFORMIN (GLUCOPHAGE-XR) 500 MG 24 hr tablet, Take 1 tablet (500 mg total) by mouth daily with breakfast., Disp: 90 tablet, Rfl: 1 .  febuxostat (ULORIC) 40  MG tablet, Take 1 tablet (40 mg total) by mouth daily., Disp: 90 tablet, Rfl: 1  No Known Allergies  I personally reviewed active problem list, medication list, allergies, family history, social history with the patient/caregiver today.   ROS  Constitutional: Negative for fever, positive for  weight change.  Respiratory: Negative for cough and shortness of breath.   Cardiovascular: Negative for chest pain or palpitations.  Gastrointestinal: Negative for abdominal pain, no bowel changes.  Musculoskeletal: Negative for gait problem or joint swelling.  Skin: Negative for rash.  Neurological: Negative for dizziness or headache.  No other specific complaints in a complete review of systems (except as listed in HPI above).  Objective  Vitals:   09/23/18 0851  BP: 114/68  Pulse: 86  Resp: 16  Temp: 98.1 F (36.7 C)  TempSrc: Oral  SpO2: 99%  Weight: 197 lb 4.8 oz (89.5 kg)  Height: 5\' 7"  (1.702 m)    Body mass index is 30.9 kg/m.  Physical Exam  Constitutional: Patient appears well-developed and well-nourished. Obese  No distress.  HEENT: head atraumatic, normocephalic, pupils equal and reactive to light,  neck supple, throat within normal limits Cardiovascular: Normal rate, regular rhythm and normal heart sounds.  No murmur heard. Trace BLE edema. Pulmonary/Chest: Effort normal and breath sounds normal. No respiratory distress. Abdominal: Soft.  There is no tenderness. Psychiatric: Patient has a normal mood and affect. behavior is normal. Judgment and thought content normal.  PHQ2/9: Depression screen [facility] 2/9 09/23/2018 12/23/2017 08/26/2017 07/16/2017 05/08/2017  Decreased Interest 0 0 0 0 0  Down, Depressed, Hopeless 0 0 0 0 0  PHQ - 2 Score 0 0 0 0 0    Fall Risk: Fall Risk  09/23/2018 03/24/2018 12/23/2017 08/26/2017 07/16/2017  Falls in the past year? No No No No No  Number falls in past yr: - - - - -  Injury with Fall? - - - - -  Follow up - - - - -     Functional  Status Survey: Is the patient deaf or have difficulty hearing?: No Does the patient have difficulty seeing, even when wearing glasses/contacts?: Yes(glasses) Does the patient have difficulty concentrating, remembering, or making decisions?: No Does the patient have difficulty walking or climbing stairs?: No Does the patient have difficulty dressing or bathing?: No Does the patient have difficulty doing errands alone such as visiting a doctor's office or shopping?: No    Office Visit from 09/23/2018 in [facility]  AUDIT-C Score  2      Assessment & Plan   1. Essential hypertension  - Candesartan Cilexetil-HCTZ 32-25 MG TABS; Take 1 tablet by mouth daily.  Dispense: 90 each; Refill: 1  2. Need for immunization against influenza  - Flu vaccine HIGH DOSE PF (Fluzone High dose)  3. Need for pneumococcal vaccination  - Pneumococcal conjugate vaccine 13-valent IM  4. Idiopathic chronic gout of left foot without tophus  - febuxostat (ULORIC) 40 MG tablet; Take 1 tablet (40 mg total) by mouth daily.  Dispense: 90 tablet; Refill: 1  5. Prediabetes  - metFORMIN (GLUCOPHAGE-XR) 500 MG 24 hr tablet; Take 1 tablet (500 mg total) by mouth daily with breakfast.  Dispense: 90 tablet; Refill: 1  6. Localized edema  No orthopnea or SOB , no decrease in exercise tolerance, advised compression stocking hose

## 2018-10-14 ENCOUNTER — Encounter: Payer: Self-pay | Admitting: Podiatry

## 2018-10-14 ENCOUNTER — Ambulatory Visit: Payer: Managed Care, Other (non HMO) | Admitting: Podiatry

## 2018-10-14 VITALS — BP 139/85 | HR 86

## 2018-10-14 DIAGNOSIS — B351 Tinea unguium: Secondary | ICD-10-CM | POA: Diagnosis not present

## 2018-10-14 DIAGNOSIS — M79676 Pain in unspecified toe(s): Secondary | ICD-10-CM

## 2018-10-15 NOTE — Progress Notes (Signed)
   SUBJECTIVE 67 year old male presenting as a new patient with a chief complaint of pain to the medial borders of the bilateral great toes that began 1-2 months ago. He states he was seen by his PCP and diagnosed with ingrown nails. Wearing certain shoes increases the pain. He has not done anything for treatment. Patient is here for further evaluation and treatment.   Past Medical History:  Diagnosis Date  . Gout   . Hypertension   . Vitamin D deficiency     OBJECTIVE General Patient is awake, alert, and oriented x 3 and in no acute distress. Derm Skin is dry and supple bilateral. Negative open lesions or macerations. Remaining integument unremarkable. Nails are tender, long, thickened and dystrophic with subungual debris, consistent with onychomycosis, 1-5 bilateral. No signs of infection noted. Vasc  DP and PT pedal pulses palpable bilaterally. Temperature gradient within normal limits.  Neuro Epicritic and protective threshold sensation grossly intact bilaterally.  Musculoskeletal Exam No symptomatic pedal deformities noted bilateral. Muscular strength within normal limits.  ASSESSMENT 1. Onychodystrophic nails 1-5 bilateral with hyperkeratosis of nails.  2. Onychomycosis of nail due to dermatophyte bilateral 3. Pain in foot bilateral  PLAN OF CARE 1. Patient evaluated today.  2. Instructed to maintain good pedal hygiene and foot care.  3. Mechanical debridement of nails 1-5 bilaterally performed using a nail nipper. Filed with dremel without incident.  4. Return to clinic as needed. If patient still has pain, we will perform nail avulsion.     Edrick Kins, DPM [facility]  Dr. Edrick Kins, [place]                                        [place], [place] [address]                Office [phone]  Fax [phone]

## 2018-12-16 ENCOUNTER — Ambulatory Visit: Payer: Managed Care, Other (non HMO) | Admitting: Podiatry

## 2018-12-23 ENCOUNTER — Ambulatory Visit: Payer: Managed Care, Other (non HMO) | Admitting: Family Medicine

## 2018-12-23 ENCOUNTER — Encounter: Payer: Self-pay | Admitting: Family Medicine

## 2018-12-23 VITALS — BP 142/78 | HR 85 | Temp 98.0°F | Resp 16 | Ht 67.0 in | Wt 201.8 lb

## 2018-12-23 DIAGNOSIS — I10 Essential (primary) hypertension: Secondary | ICD-10-CM

## 2018-12-23 DIAGNOSIS — M5416 Radiculopathy, lumbar region: Secondary | ICD-10-CM | POA: Diagnosis not present

## 2018-12-23 MED ORDER — PREDNISONE 10 MG (48) PO TBPK: ORAL_TABLET | ORAL | 0 refills | Status: DC

## 2018-12-23 NOTE — Progress Notes (Signed)
Name: Fernando Hart   MRN: [id number]    DOB: [birth year]   Date:12/23/2018       Progress Note  Subjective  Chief Complaint  Chief Complaint  Patient presents with  . Nerve Pain    Onset- Week before Christmas, bilateral legs and calf region, trouble with walking and balance.  . Leg Pain    HPI  Low back pain/radiculitis: he states right lower thigh and buttocks were painful a few weeks before Christmas. He stated he took some Aleve since it was similar to previous episodes of sciatica. However the week of Christmas the right calf and left anterior thigh and posterior left calf also started to bother him with tingling and numbness. He denies any saddle anesthesia, bowel or bladder incontinence. He used heating pack on back, Aleve otc and symptoms are better, pain level is down to 3/10, however still feeling very stiff, worse in am's and changing in positions.   HTN: bp is a little elevated today, but usually at goal, he is pain and just come in from first day back to work.    Patient Active Problem List   Diagnosis Date Noted  . Intermittent low back pain 03/24/2018  . Hyperlipidemia 12/23/2017  . Tinnitus of right ear 07/12/2017  . Dyslipidemia 10/11/2016  . Obesity 10/11/2016  . Degenerative joint disease of right hip 02/28/2016  . Prediabetes 01/10/2016  . Gout 01/10/2016  . Essential hypertension 01/10/2016    Past Surgical History:  Procedure Laterality Date  . NO PAST SURGERIES    . ROOT CANAL      Family History  Problem Relation Age of Onset  . Hypertension Mother   . Diabetes Father   . Diabetes Sister   . Sudden death Daughter     Social History   Socioeconomic History  . Marital status: Divorced    Spouse name: Not on file  . Number of children: 3  . Years of education: Not on file  . Highest education level: Associate degree: occupational, Hotel manager, or vocational program  Occupational History  . Occupation: IT   Social Needs  . Financial resource  strain: Not hard at all  . Food insecurity:    Worry: Never true    Inability: Never true  . Transportation needs:    Medical: No    Non-medical: No  Tobacco Use  . Smoking status: Former Smoker    Packs/day: 1.00    Years: 35.00    Pack years: 35.00    Types: Cigarettes    Last attempt to quit: 07/2004    Years since quitting: 14.4  . Smokeless tobacco: Never Used  Substance and Sexual Activity  . Alcohol use: Yes    Alcohol/week: 1.0 standard drinks    Types: 1 Shots of liquor per week    Comment: not every week  . Drug use: Yes  . Sexual activity: Yes    Partners: Female    Comment: partner is post-menopausal  Lifestyle  . Physical activity:    Days per week: 5 days    Minutes per session: 40 min  . Stress: Not at all  Relationships  . Social connections:    Talks on phone: More than three times a week    Gets together: More than three times a week    Attends religious service: Never    Active member of club or organization: No    Attends meetings of clubs or organizations: Never    Relationship status: Divorced  .  Intimate partner violence:    Fear of current or ex partner: No    Emotionally abused: No    Physically abused: No    Forced sexual activity: No  Other Topics Concern  . Not on file  Social History Narrative   Divorced many years ago, dating same person for 25 years she lives in [place] but they thinking about getting married   He had  3 grown children, youngest daughter died suddenly at age 69 from unknown at her gym .    8 grandchildren.    Two daughters living in [place]     Current Outpatient Medications:  .  Candesartan Cilexetil-HCTZ 32-25 MG TABS, Take 1 tablet by mouth daily., Disp: 90 each, Rfl: 1 .  Cholecalciferol (VITAMIN D PO), Take 1,000 Units by mouth daily., Disp: , Rfl:  .  febuxostat (ULORIC) 40 MG tablet, Take 1 tablet (40 mg total) by mouth daily., Disp: 90 tablet, Rfl: 1 .  metFORMIN (GLUCOPHAGE-XR) 500 MG 24 hr tablet, Take 1 tablet  (500 mg total) by mouth daily with breakfast., Disp: 90 tablet, Rfl: 1  No Known Allergies  I personally reviewed active problem list, medication list, allergies, family history, social history with the patient/caregiver today.   ROS  Constitutional: Negative for fever or significant weight change.  Respiratory: Negative for cough and shortness of breath.   Cardiovascular: Negative for chest pain or palpitations.  Gastrointestinal: Negative for abdominal pain, no bowel changes.  Musculoskeletal: Positive  for gait problem but no  joint swelling.  Skin: Negative for rash.  Neurological: Negative for dizziness or headache.  No other specific complaints in a complete review of systems (except as listed in HPI above).  Objective  Vitals:   12/23/18 1517  BP: (!) 142/78  Pulse: 85  Resp: 16  Temp: 98 F (36.7 C)  TempSrc: Oral  SpO2: 99%  Weight: 201 lb 12.8 oz (91.5 kg)  Height: 5\' 7"  (1.702 m)    Body mass index is 31.61 kg/m.  Physical Exam  Constitutional: Patient appears well-developed and well-nourished. Obese  No distress.  HEENT: head atraumatic, normocephalic, pupils equal and reactive to light, neck supple, throat within normal limits Cardiovascular: Normal rate, regular rhythm and normal heart sounds.  No murmur heard. No BLE edema. Pulmonary/Chest: Effort normal and breath sounds normal. No respiratory distress. Abdominal: Soft.  There is no tenderness. Muscular Skeletal: he has pain with rom of spine in all directions, negative straight leg raise today, normal patella reflexes , normal dorsiflexion of both feet  Psychiatric: Patient has a normal mood and affect. behavior is normal. Judgment and thought content normal.  PHQ2/9: Depression screen [facility] 2/9 09/23/2018 12/23/2017 08/26/2017 07/16/2017 05/08/2017  Decreased Interest 0 0 0 0 0  Down, Depressed, Hopeless 0 0 0 0 0  PHQ - 2 Score 0 0 0 0 0     Fall Risk: Fall Risk  12/23/2018 09/23/2018 03/24/2018 12/23/2017  08/26/2017  Falls in the past year? 1 No No No No  Number falls in past yr: 0 - - - -  Injury with Fall? 0 - - - -  Risk for fall due to : Impaired balance/gait - - - -  Follow up - - - - -    Functional Status Survey: Is the patient deaf or have difficulty hearing?: No Does the patient have difficulty seeing, even when wearing glasses/contacts?: Yes(glasses) Does the patient have difficulty concentrating, remembering, or making decisions?: No Does the patient have difficulty walking  or climbing stairs?: Yes Does the patient have difficulty dressing or bathing?: No Does the patient have difficulty doing errands alone such as visiting a doctor's office or shopping?: No    Assessment & Plan  1. Lumbar radiculitis  - predniSONE (STERAPRED UNI-PAK 48 TAB) 10 MG (48) TBPK tablet; Take as directed with food  Dispense: 48 tablet; Refill: 0  Explained to not take it before bed time, take it with food and if no resolution of symptoms we will order MRI of lumbar spine   2. Essential hypertension  We will monitor bp for now

## 2019-01-27 ENCOUNTER — Encounter: Payer: Self-pay | Admitting: Family Medicine

## 2019-01-27 ENCOUNTER — Ambulatory Visit: Payer: Managed Care, Other (non HMO) | Admitting: Family Medicine

## 2019-01-27 VITALS — BP 130/84 | HR 95 | Temp 98.0°F | Resp 16 | Ht 66.5 in | Wt 195.6 lb

## 2019-01-27 DIAGNOSIS — R05 Cough: Secondary | ICD-10-CM

## 2019-01-27 DIAGNOSIS — R058 Other specified cough: Secondary | ICD-10-CM

## 2019-01-27 MED ORDER — BENZONATATE 100 MG PO CAPS: 100.0000 mg | ORAL_CAPSULE | Freq: Three times a day (TID) | ORAL | 0 refills | Status: DC | PRN

## 2019-01-27 MED ORDER — UMECLIDINIUM-VILANTEROL 62.5-25 MCG/INH IN AEPB: 1.0000 | INHALATION_SPRAY | Freq: Every day | RESPIRATORY_TRACT | 0 refills | Status: DC

## 2019-01-27 NOTE — Progress Notes (Signed)
Name: Fernando Hart   MRN: [id number]    DOB: [birth year]   Date:01/27/2019       Progress Note  Subjective  Chief Complaint  Chief Complaint  Patient presents with  . Cough    HPI  Productive Cough: he states he has been sick for two weeks, he states started with nausea and vomiting for 24 hours followed by 24 hours of diarrhea and after that rhinorrhea, sneezing, body aches, but no fever or chills. His appetite has been decreased but is concerned about the cough, that is now productive, green sputum, chest feels congested and also has wheezing. Symptoms are worse at night. Denies sore throat or ear pain, no facial pressure or headaches. He used to smoke but quit many years ago.    Patient Active Problem List   Diagnosis Date Noted  . Intermittent low back pain 03/24/2018  . Hyperlipidemia 12/23/2017  . Tinnitus of right ear 07/12/2017  . Dyslipidemia 10/11/2016  . Obesity 10/11/2016  . Degenerative joint disease of right hip 02/28/2016  . Prediabetes 01/10/2016  . Gout 01/10/2016  . Essential hypertension 01/10/2016    Past Surgical History:  Procedure Laterality Date  . NO PAST SURGERIES    . ROOT CANAL      Family History  Problem Relation Age of Onset  . Hypertension Mother   . Diabetes Father   . Diabetes Sister   . Sudden death Daughter     Social History   Socioeconomic History  . Marital status: Divorced    Spouse name: Not on file  . Number of children: 3  . Years of education: Not on file  . Highest education level: Associate degree: occupational, Hotel manager, or vocational program  Occupational History  . Occupation: IT   Social Needs  . Financial resource strain: Not hard at all  . Food insecurity:    Worry: Never true    Inability: Never true  . Transportation needs:    Medical: No    Non-medical: No  Tobacco Use  . Smoking status: Former Smoker    Packs/day: 1.00    Years: 35.00    Pack years: 35.00    Types: Cigarettes    Last attempt to  quit: 07/2004    Years since quitting: 14.5  . Smokeless tobacco: Never Used  Substance and Sexual Activity  . Alcohol use: Yes    Alcohol/week: 1.0 standard drinks    Types: 1 Shots of liquor per week    Comment: not every week  . Drug use: Yes  . Sexual activity: Yes    Partners: Female    Comment: partner is post-menopausal  Lifestyle  . Physical activity:    Days per week: 5 days    Minutes per session: 40 min  . Stress: Not at all  Relationships  . Social connections:    Talks on phone: More than three times a week    Gets together: More than three times a week    Attends religious service: Never    Active member of club or organization: No    Attends meetings of clubs or organizations: Never    Relationship status: Divorced  . Intimate partner violence:    Fear of current or ex partner: No    Emotionally abused: No    Physically abused: No    Forced sexual activity: No  Other Topics Concern  . Not on file  Social History Narrative   Divorced many years ago, dating same person for 19  years she lives in [place] but they thinking about getting married   He had  3 grown children, youngest daughter died suddenly at age 1 from unknown at her gym .    8 grandchildren.    Two daughters living in [place]     Current Outpatient Medications:  .  Candesartan Cilexetil-HCTZ 32-25 MG TABS, Take 1 tablet by mouth daily., Disp: 90 each, Rfl: 1 .  Cholecalciferol (VITAMIN D PO), Take 1,000 Units by mouth daily., Disp: , Rfl:  .  metFORMIN (GLUCOPHAGE-XR) 500 MG 24 hr tablet, Take 1 tablet (500 mg total) by mouth daily with breakfast., Disp: 90 tablet, Rfl: 1 .  febuxostat (ULORIC) 40 MG tablet, Take 1 tablet (40 mg total) by mouth daily., Disp: 90 tablet, Rfl: 1  No Known Allergies  I personally reviewed active problem list, medication list, allergies, family history, social history with the patient/caregiver today.   ROS  Constitutional: Negative for fever or weight change.   Respiratory: Postive for cough but no  shortness of breath.   Cardiovascular: positive for chest pain wall pain when he coughs, but no palpitations.  Gastrointestinal: Negative for abdominal pain, no bowel changes.  Musculoskeletal: Negative for gait problem or joint swelling.  Skin: Negative for rash.  Neurological: Negative for dizziness or headache.  No other specific complaints in a complete review of systems (except as listed in HPI above).  Objective  Vitals:   01/27/19 0837  BP: 130/84  Pulse: 95  Resp: 16  Temp: 98 F (36.7 C)  TempSrc: Oral  SpO2: 98%  Weight: 195 lb 9.6 oz (88.7 kg)  Height: 5' 6.5" (1.689 m)    Body mass index is 31.1 kg/m.  Physical Exam  Constitutional: Patient appears well-developed and well-nourished. Obese  No distress.  HEENT: head atraumatic, normocephalic, pupils equal and reactive to light, ears normal TM bilateral, neck supple, throat within normal limits, boggy turbinates Cardiovascular: Normal rate, regular rhythm and normal heart sounds.  No murmur heard. No BLE edema. Pulmonary/Chest: Effort normal and breath sounds normal. No respiratory distress. Abdominal: Soft.  There is no tenderness. Muscular Skeletal: no pain during palpation of lumbar spine, negative straight leg raise  Psychiatric: Patient has a normal mood and affect. behavior is normal. Judgment and thought content normal.  PHQ2/9: Depression screen [facility] 2/9 01/27/2019 09/23/2018 12/23/2017 08/26/2017 07/16/2017  Decreased Interest 0 0 0 0 0  Down, Depressed, Hopeless 0 0 0 0 0  PHQ - 2 Score 0 0 0 0 0     Fall Risk: Fall Risk  01/27/2019 12/23/2018 09/23/2018 03/24/2018 12/23/2017  Falls in the past year? 0 1 No No No  Number falls in past yr: 0 0 - - -  Injury with Fall? 0 0 - - -  Risk for fall due to : - Impaired balance/gait - - -  Follow up - - - - -      Assessment & Plan  1. Productive cough  We will hold off on antibiotics at this time, however patient was  advised to contact me in 2 days if no improvement and I will send in [facility] for him  - benzonatate (TESSALON) 100 MG capsule; Take 1-2 capsules (100-200 mg total) by mouth 3 (three) times daily as needed.  Dispense: 40 capsule; Refill: 0 - umeclidinium-vilanterol (ANORO ELLIPTA) 62.5-25 MCG/INH AEPB; Inhale 1 puff into the lungs daily.  Dispense: 60 each; Refill: 0

## 2019-03-24 ENCOUNTER — Other Ambulatory Visit: Payer: Self-pay | Admitting: Family Medicine

## 2019-03-24 DIAGNOSIS — R7303 Prediabetes: Secondary | ICD-10-CM

## 2019-03-24 DIAGNOSIS — I10 Essential (primary) hypertension: Secondary | ICD-10-CM

## 2019-03-26 ENCOUNTER — Other Ambulatory Visit: Payer: Self-pay

## 2019-03-26 ENCOUNTER — Encounter: Payer: Self-pay | Admitting: Family Medicine

## 2019-03-26 ENCOUNTER — Ambulatory Visit (INDEPENDENT_AMBULATORY_CARE_PROVIDER_SITE_OTHER): Payer: Managed Care, Other (non HMO) | Admitting: Family Medicine

## 2019-03-26 DIAGNOSIS — M1A0720 Idiopathic chronic gout, left ankle and foot, without tophus (tophi): Secondary | ICD-10-CM

## 2019-03-26 DIAGNOSIS — E785 Hyperlipidemia, unspecified: Secondary | ICD-10-CM | POA: Diagnosis not present

## 2019-03-26 DIAGNOSIS — R7303 Prediabetes: Secondary | ICD-10-CM | POA: Diagnosis not present

## 2019-03-26 DIAGNOSIS — I10 Essential (primary) hypertension: Secondary | ICD-10-CM

## 2019-03-26 MED ORDER — CANDESARTAN CILEXETIL-HCTZ 32-25 MG PO TABS: 1.0000 | ORAL_TABLET | Freq: Every day | ORAL | 1 refills | Status: DC

## 2019-03-26 MED ORDER — ASPIRIN EC 81 MG PO TBEC: 81.0000 mg | DELAYED_RELEASE_TABLET | Freq: Every day | ORAL | 0 refills | Status: DC

## 2019-03-26 NOTE — Progress Notes (Signed)
Name: Fernando Hart   MRN: [id number]    DOB: Dec 03, 1951   Date:03/26/2019       Progress Note  Subjective  Chief Complaint  Chief Complaint  Patient presents with  . Hypertension  . Hyperlipidemia  . Prediabetes    I connected with@ on 03/26/19 at  8:40 AM EDT by telephone and verified that I am speaking with the correct person using two identifiers.  I discussed the limitations, risks, security and privacy concerns of performing an evaluation and management service by telephone and the availability of in person appointments. Staff also discussed with the patient that there may be a patient responsible charge related to this service. Patient Location: at home  Provider Location: [facility]   HPI  HTN: he is compliant with bp medication, he denies chest pain or palpitation, bp is at goal, mild ankle edema.   Hyperlipidemia: he states he is afraid of taking crestor, and has not been compliant with medication, reviewed labs done at work The 10-year ASCVD risk score [name] DC Brooke Bonito., et al., 2013) is: 34%*   Values used to calculate the score:     Age: 68 years     Sex: Male     Is Non-Hispanic African American: Yes     Diabetic: Yes     Tobacco smoker: No     Systolic Blood Pressure: 268 mmHg     Is BP treated: Yes     HDL Cholesterol: 34 mg/dL*     Total Cholesterol: 173 mg/dL*     * - Cholesterol units were assumed for this score calculation.   Hyperglycemia: He no longer has episodes of polyphagia also denies polydipsia or polyuria. He is taking Metformin and changed diet, hgbA1C was 6.4% and is down to 5.7% and he has lost weight, continue medication   Gout: controlled since started on Uloric last year, last uric acid was normal, he stopped Uloric on his own a couple of months  ago and no symptoms so we will take it off his list   Obesity: he has multiple co-morbidities. He lost 14 lbs past 6 months, but gained 2 lbs by his visit in Feb . He has been  walking daily and eating healthy, stopped drinking beer on a regular basis. Weight is stable, his next goal is 190 lbs   Patient Active Problem List   Diagnosis Date Noted  . Intermittent low back pain 03/24/2018  . Hyperlipidemia 12/23/2017  . Tinnitus of right ear 07/12/2017  . Dyslipidemia 10/11/2016  . Obesity 10/11/2016  . Degenerative joint disease of right hip 02/28/2016  . Prediabetes 01/10/2016  . Gout 01/10/2016  . Essential hypertension 01/10/2016    Past Surgical History:  Procedure Laterality Date  . NO PAST SURGERIES    . ROOT CANAL      Family History  Problem Relation Age of Onset  . Hypertension Mother   . Diabetes Father   . Diabetes Sister   . Sudden death Daughter     Social History   Socioeconomic History  . Marital status: Divorced    Spouse name: Not on file  . Number of children: 3  . Years of education: Not on file  . Highest education level: Associate degree: occupational, Hotel manager, or vocational program  Occupational History  . Occupation: IT   Social Needs  . Financial resource strain: Not hard at all  . Food insecurity:    Worry: Never true    Inability: Never true  .  Transportation needs:    Medical: No    Non-medical: No  Tobacco Use  . Smoking status: Former Smoker    Packs/day: 1.00    Years: 35.00    Pack years: 35.00    Types: Cigarettes    Last attempt to quit: 07/2004    Years since quitting: 14.6  . Smokeless tobacco: Never Used  Substance and Sexual Activity  . Alcohol use: Yes    Alcohol/week: 1.0 standard drinks    Types: 1 Shots of liquor per week    Comment: not every week  . Drug use: Yes  . Sexual activity: Yes    Partners: Female    Comment: partner is post-menopausal  Lifestyle  . Physical activity:    Days per week: 5 days    Minutes per session: 40 min  . Stress: Not at all  Relationships  . Social connections:    Talks on phone: More than three times a week    Gets together: More than three  times a week    Attends religious service: Never    Active member of club or organization: No    Attends meetings of clubs or organizations: Never    Relationship status: Divorced  . Intimate partner violence:    Fear of current or ex partner: No    Emotionally abused: No    Physically abused: No    Forced sexual activity: No  Other Topics Concern  . Not on file  Social History Narrative   Divorced many years ago, dating same person for 25 years she lives in [place] but they thinking about getting married   He had  3 grown children, youngest daughter died suddenly at age 13 from unknown at her gym .    8 grandchildren.    Two daughters living in [place]     Current Outpatient Medications:  .  Candesartan Cilexetil-HCTZ 32-25 MG TABS, Take 1 tablet by mouth daily., Disp: 90 each, Rfl: 1 .  Cholecalciferol (VITAMIN D PO), Take 1,000 Units by mouth daily., Disp: , Rfl:  .  metFORMIN (GLUCOPHAGE-XR) 500 MG 24 hr tablet, Take 1 tablet (500 mg total) by mouth daily with breakfast., Disp: 90 tablet, Rfl: 1  No Known Allergies  I personally reviewed active problem list, medication list, allergies, family history, social history with the patient/caregiver today.   ROS  Constitutional: Negative for fever or weight change.  Respiratory: Negative for cough and shortness of breath.   Cardiovascular: Negative for chest pain or palpitations.  Gastrointestinal: Negative for abdominal pain, no bowel changes.  Musculoskeletal: Negative for gait problem or joint swelling.  Skin: Negative for rash.  Neurological: Negative for dizziness or headache.  No other specific complaints in a complete review of systems (except as listed in HPI above).   Objective  Virtual encounter, vitals not obtained.  There is no height or weight on file to calculate BMI.  Physical Exam  Awake, alert and strong and smooth voice over the phone  PHQ2/9: Depression screen [facility] 2/9 03/26/2019 01/27/2019 09/23/2018 12/23/2017  08/26/2017  Decreased Interest 0 0 0 0 0  Down, Depressed, Hopeless 0 0 0 0 0  PHQ - 2 Score 0 0 0 0 0  Altered sleeping 0 - - - -  Tired, decreased energy 0 - - - -  Change in appetite 0 - - - -  Feeling bad or failure about yourself  0 - - - -  Trouble concentrating 0 - - - -  Moving slowly or fidgety/restless 0 - - - -  Suicidal thoughts 0 - - - -  PHQ-9 Score 0 - - - -   PHQ-2/9 Result is negative.    Fall Risk: Fall Risk  03/26/2019 01/27/2019 12/23/2018 09/23/2018 03/24/2018  Falls in the past year? 0 0 1 No No  Number falls in past yr: 0 0 0 - -  Injury with Fall? 0 0 0 - -  Risk for fall due to : - - Impaired balance/gait - -  Follow up - - - - -     Assessment & Plan    1. Essential hypertension  - Candesartan Cilexetil-HCTZ 32-25 MG TABS; Take 1 tablet by mouth daily.  Dispense: 90 each; Refill: 1  2. Idiopathic chronic gout of left foot without tophus  Not taking Uloric   3. Prediabetes  Taking medication but still has it at home  4. Dyslipidemia  Refuses medication, he will let me know if he changes his mind  I discussed the assessment and treatment plan with the patient. The patient was provided an opportunity to ask questions and all were answered. The patient agreed with the plan and demonstrated an understanding of the instructions.   The patient was advised to call back or seek an in-person evaluation if the symptoms worsen or if the condition fails to improve as anticipated.  I provided 25 minutes of non-face-to-face time during this encounter.  Loistine Chance, MD

## 2019-06-24 ENCOUNTER — Other Ambulatory Visit: Payer: Self-pay | Admitting: Family Medicine

## 2019-06-24 DIAGNOSIS — R7303 Prediabetes: Secondary | ICD-10-CM

## 2019-07-15 ENCOUNTER — Ambulatory Visit: Payer: Managed Care, Other (non HMO) | Admitting: Family Medicine

## 2019-07-15 ENCOUNTER — Other Ambulatory Visit: Payer: Self-pay

## 2019-07-15 ENCOUNTER — Encounter: Payer: Self-pay | Admitting: Family Medicine

## 2019-07-15 VITALS — BP 122/64 | HR 93 | Temp 96.9°F | Resp 16 | Ht 66.5 in | Wt 198.8 lb

## 2019-07-15 DIAGNOSIS — E559 Vitamin D deficiency, unspecified: Secondary | ICD-10-CM

## 2019-07-15 DIAGNOSIS — M109 Gout, unspecified: Secondary | ICD-10-CM

## 2019-07-15 DIAGNOSIS — Z139 Encounter for screening, unspecified: Secondary | ICD-10-CM

## 2019-07-15 DIAGNOSIS — M2560 Stiffness of unspecified joint, not elsewhere classified: Secondary | ICD-10-CM

## 2019-07-15 DIAGNOSIS — I10 Essential (primary) hypertension: Secondary | ICD-10-CM

## 2019-07-15 DIAGNOSIS — R7303 Prediabetes: Secondary | ICD-10-CM | POA: Diagnosis not present

## 2019-07-15 DIAGNOSIS — E785 Hyperlipidemia, unspecified: Secondary | ICD-10-CM | POA: Diagnosis not present

## 2019-07-15 DIAGNOSIS — Z79899 Other long term (current) drug therapy: Secondary | ICD-10-CM

## 2019-07-15 MED ORDER — CANDESARTAN CILEXETIL-HCTZ 32-25 MG PO TABS: 1.0000 | ORAL_TABLET | Freq: Every day | ORAL | 1 refills | Status: DC

## 2019-07-15 MED ORDER — ROSUVASTATIN CALCIUM 10 MG PO TABS: 10.0000 mg | ORAL_TABLET | Freq: Every day | ORAL | 0 refills | Status: DC

## 2019-07-15 NOTE — Progress Notes (Signed)
Name: Fernando Hart   MRN: [id number]    DOB: Nov 05, 1951   Date:07/15/2019       Progress Note  Subjective  Chief Complaint  Chief Complaint  Patient presents with  . Medication Refill    3 month F/U  . Hypertension  . Prediabetes    Would like to discuss his Metformin-causing tiredness  . Hyperlipidemia  . Leg Pain    Bilateral Leg pain for the past 2 weeks.    HPI  HTN: he is compliant with bp medication, he denies chest pain or palpitation, bp is at goal, mild ankle edema.Denies side effects of medication   Joint stiffness: he is waking up feeling stiff, but improves throughout the day, he has not been active lately because there are too many people at the local track, advised to try out [employer]  Hyperlipidemia: discussed again importance of statin therapy.   The 10-year ASCVD risk score Fernando Hart Fernando Hart., Fernando al., 2013) is: 31.7%*   Values used to calculate the score:     Age: 69 years     Sex: Male     Is Non-Hispanic African American: Yes     Diabetic: Yes     Tobacco smoker: No     Systolic Blood Pressure: 573 mmHg     Is BP treated: Yes     HDL Cholesterol: 34 mg/dL*     Total Cholesterol: 173 mg/dL*     * - Cholesterol units were assumed for this score calculation   Hyperglycemia: He no longer has episodes of polyphagia also denies polydipsia or polyuria. He istaking Metformin and changed diet, hgbA1C was 6.4% and is down to 5.7% and he initially he lost weight, he denies diarrhea, but he feels tired all the time , he stopped medication and fatigue resolved, he re-started and fatigue resumed. He will try to cut down on pasta  Gout: controlled since started on Uloric last year,last uric acid was normal, he stopped Uloric on his own a couple of months  ago and no symptoms so we will take it off his list Unchanged  Obesity: he has multiple co-morbidities.He lost 14 lbs past 6 months, but gained 7 lbs since January.   Patient Active Problem List   Diagnosis  Date Noted  . Intermittent low back pain 03/24/2018  . Hyperlipidemia 12/23/2017  . Tinnitus of right ear 07/12/2017  . Dyslipidemia 10/11/2016  . Obesity 10/11/2016  . Degenerative joint disease of right hip 02/28/2016  . Prediabetes 01/10/2016  . Gout 01/10/2016  . Essential hypertension 01/10/2016    Past Surgical History:  Procedure Laterality Date  . NO PAST SURGERIES    . ROOT CANAL      Family History  Problem Relation Age of Onset  . Hypertension Fernando Hart   . Diabetes Fernando Hart   . Diabetes Fernando Hart   . Sudden death Fernando Hart     Social History   Socioeconomic History  . Marital status: Divorced    Spouse name: Not on file  . Number of children: 3  . Years of education: Not on file  . Highest education level: Associate degree: occupational, Hotel manager, or vocational program  Occupational History  . Occupation: IT   Social Needs  . Financial resource strain: Not hard at all  . Food insecurity    Worry: Never true    Inability: Never true  . Transportation needs    Medical: No    Non-medical: No  Tobacco Use  . Smoking status: Former  Smoker    Packs/day: 1.00    Years: 35.00    Pack years: 35.00    Types: Cigarettes    Quit date: 07/2004    Years since quitting: 15.0  . Smokeless tobacco: Never Used  Substance and Sexual Activity  . Alcohol use: Yes    Alcohol/week: 1.0 standard drinks    Types: 1 Shots of liquor per week    Comment: not every week  . Drug use: Yes  . Sexual activity: Yes    Partners: Female    Comment: partner is post-menopausal  Lifestyle  . Physical activity    Days per week: 5 days    Minutes per session: 40 min  . Stress: Not at all  Relationships  . Social connections    Talks on phone: More than three times a week    Gets together: More than three times a week    Attends religious service: Never    Active member of club or organization: No    Attends meetings of clubs or organizations: Never    Relationship status:  Divorced  . Intimate partner violence    Fear of current or ex partner: No    Emotionally abused: No    Physically abused: No    Forced sexual activity: No  Other Topics Concern  . Not on file  Social History Narrative   Divorced many years ago, dating same person for 25 years she lives in [place] but they thinking about getting married   He had  3 grown children, youngest Fernando Hart died suddenly at age 46 from unknown at her gym .    8 grandchildren.    Two daughters living in [place]     Current Outpatient Medications:  .  aspirin EC 81 MG tablet, Take 1 tablet (81 mg total) by mouth daily., Disp: 30 tablet, Rfl: 0 .  Candesartan Cilexetil-HCTZ 32-25 MG TABS, Take 1 tablet by mouth daily., Disp: 90 each, Rfl: 1 .  Cholecalciferol (VITAMIN D PO), Take 1,000 Units by mouth daily., Disp: , Rfl:  .  metFORMIN (GLUCOPHAGE-XR) 500 MG 24 hr tablet, TAKE 1 TABLET BY MOUTH  DAILY WITH BREAKFAST, Disp: 90 tablet, Rfl: 0  No Known Allergies  I personally reviewed active problem list, medication list, allergies, family history, social history with the patient/caregiver today.   ROS  Constitutional: Negative for fever or weight change.  Respiratory: Negative for cough and shortness of breath.   Cardiovascular: Negative for chest pain or palpitations.  Gastrointestinal: Negative for abdominal pain, no bowel changes.  Musculoskeletal: Negative for gait problem or joint swelling.  Skin: Negative for rash.  Neurological: Negative for dizziness or headache.  No other specific complaints in a complete review of systems (except as listed in HPI above).  Objective  Vitals:   07/15/19 0923  BP: 122/64  Pulse: 93  Resp: 16  Temp: (!) 96.9 F (36.1 C)  TempSrc: Temporal  SpO2: 98%  Weight: 198 lb 12.8 oz (90.2 kg)  Height: 5' 6.5" (1.689 m)    Body mass index is 31.61 kg/m.  Physical Exam  Constitutional: Patient appears well-developed and well-nourished. Obese  No distress.  HEENT: head  atraumatic, normocephalic, pupils equal and reactive to light,neck supple Cardiovascular: Normal rate, regular rhythm and normal heart sounds.  No murmur heard. Trace  BLE edema. Pulmonary/Chest: Effort normal and breath sounds normal. No respiratory distress. Abdominal: Soft.  There is no tenderness. Psychiatric: Patient has a normal mood and affect. behavior is normal.  Judgment and thought content normal.   PHQ2/9: Depression screen [facility] 2/9 07/15/2019 03/26/2019 01/27/2019 09/23/2018 12/23/2017  Decreased Interest 0 0 0 0 0  Down, Depressed, Hopeless 0 0 0 0 0  PHQ - 2 Score 0 0 0 0 0  Altered sleeping 0 0 - - -  Tired, decreased energy 0 0 - - -  Change in appetite 0 0 - - -  Feeling bad or failure about yourself  0 0 - - -  Trouble concentrating 0 0 - - -  Moving slowly or fidgety/restless 0 0 - - -  Suicidal thoughts 0 0 - - -  PHQ-9 Score 0 0 - - -  Difficult doing work/chores Not difficult at all - - - -    phq 9 is negative   Fall Risk: Fall Risk  07/15/2019 03/26/2019 01/27/2019 12/23/2018 09/23/2018  Falls in the past year? 0 0 0 1 No  Number falls in past yr: 0 0 0 0 -  Injury with Fall? 0 0 0 0 -  Risk for fall due to : - - - Impaired balance/gait -  Follow up - - - - -    Functional Status Survey: Is the patient deaf or have difficulty hearing?: No Does the patient have difficulty seeing, even when wearing glasses/contacts?: No Does the patient have difficulty concentrating, remembering, or making decisions?: No Does the patient have difficulty walking or climbing stairs?: No Does the patient have difficulty dressing or bathing?: No Does the patient have difficulty doing errands alone such as visiting a doctor's office or shopping?: No   Assessment & Plan  1. Dyslipidemia  - Lipid panel - rosuvastatin (CRESTOR) 10 MG tablet; Take 1 tablet (10 mg total) by mouth daily.  Dispense: 90 tablet; Refill: 0  2. Prediabetes  - Hemoglobin A1c  3. Essential hypertension  -  CBC with Differential/Platelet - Comprehensive metabolic panel - Candesartan Cilexetil-HCTZ 32-25 MG TABS; Take 1 tablet by mouth daily.  Dispense: 90 tablet; Refill: 1  4. Vitamin D deficiency  Recheck vitamin D   5. Controlled gout  - Uric acid  6. Encounter for health-related screening  - SAR CoV2 Serology (COVID 19)AB(IGG)IA  7. Joint stiffness  - ANA,IFA RA Diag Pnl w/rflx Tit/Patn

## 2019-07-25 LAB — CBC WITH DIFFERENTIAL/PLATELET
Basophils Absolute: 0 10*3/uL (ref 0.0–0.2)
Basos: 1 %
EOS (ABSOLUTE): 0.6 10*3/uL — ABNORMAL HIGH (ref 0.0–0.4)
Eos: 12 %
Hematocrit: 47.6 % (ref 37.5–51.0)
Hemoglobin: 16.2 g/dL (ref 13.0–17.7)
Immature Grans (Abs): 0 10*3/uL (ref 0.0–0.1)
Immature Granulocytes: 0 %
Lymphocytes Absolute: 1.7 10*3/uL (ref 0.7–3.1)
Lymphs: 35 %
MCH: 28.2 pg (ref 26.6–33.0)
MCHC: 34 g/dL (ref 31.5–35.7)
MCV: 83 fL (ref 79–97)
Monocytes Absolute: 0.6 10*3/uL (ref 0.1–0.9)
Monocytes: 12 %
Neutrophils Absolute: 1.9 10*3/uL (ref 1.4–7.0)
Neutrophils: 40 %
Platelets: 222 10*3/uL (ref 150–450)
RBC: 5.74 x10E6/uL (ref 4.14–5.80)
RDW: 12.1 % (ref 11.6–15.4)
WBC: 4.8 10*3/uL (ref 3.4–10.8)

## 2019-07-25 LAB — COMPREHENSIVE METABOLIC PANEL
ALT: 33 IU/L (ref 0–44)
AST: 31 IU/L (ref 0–40)
Albumin/Globulin Ratio: 1.8 (ref 1.2–2.2)
Albumin: 4.4 g/dL (ref 3.8–4.8)
Alkaline Phosphatase: 64 IU/L (ref 39–117)
BUN/Creatinine Ratio: 14 (ref 10–24)
BUN: 19 mg/dL (ref 8–27)
Bilirubin Total: 0.6 mg/dL (ref 0.0–1.2)
CO2: 23 mmol/L (ref 20–29)
Calcium: 10.1 mg/dL (ref 8.6–10.2)
Chloride: 100 mmol/L (ref 96–106)
Creatinine, Ser: 1.4 mg/dL — ABNORMAL HIGH (ref 0.76–1.27)
GFR calc Af Amer: 59 mL/min/{1.73_m2} — ABNORMAL LOW (ref >59)
GFR calc non Af Amer: 51 mL/min/{1.73_m2} — ABNORMAL LOW (ref >59)
Globulin, Total: 2.4 g/dL (ref 1.5–4.5)
Glucose: 98 mg/dL (ref 65–99)
Potassium: 3.7 mmol/L (ref 3.5–5.2)
Sodium: 139 mmol/L (ref 134–144)
Total Protein: 6.8 g/dL (ref 6.0–8.5)

## 2019-07-25 LAB — URIC ACID: Uric Acid: 5.2 mg/dL (ref 3.7–8.6)

## 2019-07-25 LAB — LIPID PANEL
Chol/HDL Ratio: 4.4 ratio (ref 0.0–5.0)
Cholesterol, Total: 146 mg/dL (ref 100–199)
HDL: 33 mg/dL — ABNORMAL LOW (ref >39)
LDL Calculated: 95 mg/dL (ref 0–99)
Triglycerides: 88 mg/dL (ref 0–149)
VLDL Cholesterol Cal: 18 mg/dL (ref 5–40)

## 2019-07-25 LAB — VITAMIN D 25 HYDROXY (VIT D DEFICIENCY, FRACTURES): Vit D, 25-Hydroxy: 23.5 ng/mL — ABNORMAL LOW (ref 30.0–100.0)

## 2019-07-25 LAB — B12 AND FOLATE PANEL
Folate: 8.8 ng/mL (ref >3.0)
Vitamin B-12: 328 pg/mL (ref 232–1245)

## 2019-07-25 LAB — HEMOGLOBIN A1C
Est. average glucose Bld gHb Est-mCnc: 126 mg/dL
Hgb A1c MFr Bld: 6 % — ABNORMAL HIGH (ref 4.8–5.6)

## 2019-07-25 LAB — SAR COV2 SEROLOGY (COVID19)AB(IGG),IA: SARS-CoV-2 Ab, IgG: NEGATIVE

## 2019-07-27 LAB — ANA,IFA RA DIAG PNL W/RFLX TIT/PATN
ANA Titer 1: NEGATIVE
Cyclic Citrullin Peptide Ab: 3 units (ref 0–19)
Rhuematoid fact SerPl-aCnc: <10 IU/mL (ref 0.0–13.9)

## 2019-08-12 ENCOUNTER — Other Ambulatory Visit: Payer: Self-pay | Admitting: Family Medicine

## 2019-08-12 NOTE — Telephone Encounter (Signed)
Requested medication (s) are due for refill today: no  Requested medication (s) are on the active medication list: no  Last refill:   Future visit scheduled: yes  Notes to clinic:  Medication not on current medication list   Requested Prescriptions  Pending Prescriptions Disp Refills   metFORMIN (GLUCOPHAGE-XR) 500 MG 24 hr tablet [Pharmacy Med Name: METFORMIN ER 500MG TABLET] 90 tablet 3    Sig: TAKE 1 TABLET BY [place]     Endocrinology:  Diabetes - Biguanides Failed - 08/12/2019  5:04 AM      Failed - Cr in normal range and within 360 days    Creatinine, Ser  Date Value Ref Range Status  07/24/2019 1.40 (H) 0.76 - 1.27 mg/dL Final         Failed - eGFR in normal range and within 360 days    GFR calc Af Amer  Date Value Ref Range Status  07/24/2019 59 (L) >59 mL/min/1.73 Final   GFR calc non Af Amer  Date Value Ref Range Status  07/24/2019 51 (L) >59 mL/min/1.73 Final         Passed - HBA1C is between 0 and 7.9 and within 180 days    Hgb A1c MFr Bld  Date Value Ref Range Status  07/24/2019 6.0 (H) 4.8 - 5.6 % Final    Comment:             Prediabetes: 5.7 - 6.4          Diabetes: >6.4          Glycemic control for adults with diabetes: <7.0          Passed - Valid encounter within last 6 months    Recent Outpatient Visits          4 weeks ago Dyslipidemia   [facility] Steele Sizer, MD   4 months ago Essential hypertension   [facility] Steele Sizer, MD   6 months ago Productive cough   [facility] Steele Sizer, MD   7 months ago Lumbar radiculitis   [facility] Steele Sizer, MD   10 months ago Essential hypertension   [facility] Steele Sizer, MD      Future Appointments            In 2 months Ancil Boozer, Drue Stager, MD [facility], [facility]

## 2019-09-02 ENCOUNTER — Other Ambulatory Visit: Payer: Self-pay | Admitting: Family Medicine

## 2019-09-02 DIAGNOSIS — E785 Hyperlipidemia, unspecified: Secondary | ICD-10-CM

## 2019-09-02 NOTE — Telephone Encounter (Signed)
Requested medication (s) are due for refill today: yes  Requested medication (s) are on the active medication list: yes  Last refill:  07/15/2019  Future visit scheduled: yes  Notes to clinic:  Requesting 1 year supply   Requested Prescriptions  Pending Prescriptions Disp Refills   rosuvastatin (CRESTOR) 10 MG tablet [Pharmacy Med Name: ROSUVASTATIN  10MG   TAB] 90 tablet 3    Sig: TAKE 1 TABLET BY MOUTH  DAILY     Cardiovascular:  Antilipid - Statins Failed - 09/02/2019  6:12 AM      Failed - HDL in normal range and within 360 days    HDL  Date Value Ref Range Status  07/24/2019 33 (L) >39 mg/dL Final         Passed - Total Cholesterol in normal range and within 360 days    Cholesterol, Total  Date Value Ref Range Status  07/24/2019 146 100 - 199 mg/dL Final         Passed - LDL in normal range and within 360 days    LDL Calculated  Date Value Ref Range Status  07/24/2019 95 0 - 99 mg/dL Final         Passed - Triglycerides in normal range and within 360 days    Triglycerides  Date Value Ref Range Status  07/24/2019 88 0 - 149 mg/dL Final         Passed - Patient is not pregnant      Passed - Valid encounter within last 12 months    Recent Outpatient Visits          1 month ago Dyslipidemia   [facility] Steele Sizer, MD   5 months ago Essential hypertension   [facility] Steele Sizer, MD   7 months ago Productive cough   [facility] Steele Sizer, MD   8 months ago Lumbar radiculitis   [facility] Steele Sizer, MD   11 months ago Essential hypertension   [facility] Steele Sizer, MD      Future Appointments            In 1 month Ancil Boozer, Drue Stager, MD [facility], [facility]

## 2019-10-15 ENCOUNTER — Ambulatory Visit: Payer: Managed Care, Other (non HMO) | Admitting: Family Medicine

## 2019-10-15 ENCOUNTER — Encounter: Payer: Self-pay | Admitting: Family Medicine

## 2019-10-15 ENCOUNTER — Other Ambulatory Visit: Payer: Self-pay

## 2019-10-15 VITALS — BP 136/78 | HR 100 | Temp 97.3°F | Resp 16 | Ht 66.0 in | Wt 202.4 lb

## 2019-10-15 DIAGNOSIS — I10 Essential (primary) hypertension: Secondary | ICD-10-CM

## 2019-10-15 DIAGNOSIS — E538 Deficiency of other specified B group vitamins: Secondary | ICD-10-CM

## 2019-10-15 DIAGNOSIS — R7989 Other specified abnormal findings of blood chemistry: Secondary | ICD-10-CM

## 2019-10-15 DIAGNOSIS — R7303 Prediabetes: Secondary | ICD-10-CM

## 2019-10-15 DIAGNOSIS — Z23 Encounter for immunization: Secondary | ICD-10-CM

## 2019-10-15 DIAGNOSIS — M109 Gout, unspecified: Secondary | ICD-10-CM | POA: Diagnosis not present

## 2019-10-15 DIAGNOSIS — E559 Vitamin D deficiency, unspecified: Secondary | ICD-10-CM

## 2019-10-15 DIAGNOSIS — E785 Hyperlipidemia, unspecified: Secondary | ICD-10-CM | POA: Diagnosis not present

## 2019-10-15 MED ORDER — B-12 1000 MCG SL SUBL: 1.0000 | SUBLINGUAL_TABLET | Freq: Every day | SUBLINGUAL | 0 refills | Status: DC

## 2019-10-15 MED ORDER — CANDESARTAN CILEXETIL-HCTZ 32-25 MG PO TABS: 1.0000 | ORAL_TABLET | Freq: Every day | ORAL | 1 refills | Status: DC

## 2019-10-15 NOTE — Patient Instructions (Signed)
Call insurance and ask if they pay for Shingrix

## 2019-10-15 NOTE — Progress Notes (Signed)
Name: Fernando Hart   MRN: [id number]    DOB: 11/20/1951   Date:10/15/2019       Progress Note  Subjective  Chief Complaint  Chief Complaint  Patient presents with  . Hyperlipidemia    3 month follow up  . Hypertension  . Joint Swelling    fingers painful cannot make a fist    HPI  HTN: he is compliant with bp medication, he denies chest pain or palpitation, bp is at goal, very mild ankle edema.   Joint stiffness: he is waking up feeling stiff, but improves throughout the day, he has not been active lately because there are too many people at the local track. He has noticed right index finger has been swollen and very stiff lately. ANA panel negative, likely OA. Advised to go back on walks again   Hyperlipidemia: he is now taking statins, no significant changes on body aches.  Hyperglycemia: Heno longer has episodes of polyphagia alsodenies polydipsia or polyuria. He was taking Metformin and changed diet, however we stopped it last visit because he was feeling tired and groggy around 11 am, hgbA1C was 6.4% went down to 5.7% but up again to 6%, he has been working from home and less active. He states he will try resuming physical activity.  Gout: he stopped Uloric on his own a couple of months ago and no symptoms , last uric was still normal, stay off medication and monitor for symptoms   Obesity: he has multiple co-morbidities.He lost 14 lbs past 6 months, but gained 9 lbs since January, discussed importance of resuming physical activity     Patient Active Problem List   Diagnosis Date Noted  . Intermittent low back pain 03/24/2018  . Hyperlipidemia 12/23/2017  . Tinnitus of right ear 07/12/2017  . Dyslipidemia 10/11/2016  . Obesity 10/11/2016  . Degenerative joint disease of right hip 02/28/2016  . Prediabetes 01/10/2016  . Gout 01/10/2016  . Essential hypertension 01/10/2016    Past Surgical History:  Procedure Laterality Date  . NO PAST SURGERIES    . ROOT  CANAL      Family History  Problem Relation Age of Onset  . Hypertension Mother   . Diabetes Father   . Diabetes Sister   . Sudden death Daughter     Social History   Socioeconomic History  . Marital status: Divorced    Spouse name: Not on file  . Number of children: 3  . Years of education: Not on file  . Highest education level: Associate degree: occupational, Hotel manager, or vocational program  Occupational History  . Occupation: IT   Social Needs  . Financial resource strain: Not hard at all  . Food insecurity    Worry: Never true    Inability: Never true  . Transportation needs    Medical: No    Non-medical: No  Tobacco Use  . Smoking status: Former Smoker    Packs/day: 1.00    Years: 35.00    Pack years: 35.00    Types: Cigarettes    Quit date: 07/2004    Years since quitting: 15.2  . Smokeless tobacco: Never Used  Substance and Sexual Activity  . Alcohol use: Yes    Alcohol/week: 1.0 standard drinks    Types: 1 Shots of liquor per week    Comment: not every week  . Drug use: Yes  . Sexual activity: Yes    Partners: Female    Comment: partner is post-menopausal  Lifestyle  . Physical  activity    Days per week: 5 days    Minutes per session: 40 min  . Stress: Not at all  Relationships  . Social connections    Talks on phone: More than three times a week    Gets together: More than three times a week    Attends religious service: Never    Active member of club or organization: No    Attends meetings of clubs or organizations: Never    Relationship status: Divorced  . Intimate partner violence    Fear of current or ex partner: No    Emotionally abused: No    Physically abused: No    Forced sexual activity: No  Other Topics Concern  . Not on file  Social History Narrative   Divorced many years ago, dating same person for 25 years she lives in [place] but they thinking about getting married   He had  3 grown children, youngest daughter died suddenly at  age 41 from unknown at her gym .    8 grandchildren.    Two daughters living in [place]     Current Outpatient Medications:  .  aspirin EC 81 MG tablet, Take 1 tablet (81 mg total) by mouth daily., Disp: 30 tablet, Rfl: 0 .  Candesartan Cilexetil-HCTZ 32-25 MG TABS, Take 1 tablet by mouth daily., Disp: 90 tablet, Rfl: 1 .  Cholecalciferol (VITAMIN D PO), Take 1,000 Units by mouth daily., Disp: , Rfl:  .  rosuvastatin (CRESTOR) 10 MG tablet, TAKE 1 TABLET BY MOUTH  DAILY, Disp: 90 tablet, Rfl: 1 .  metFORMIN (GLUCOPHAGE-XR) 500 MG 24 hr tablet, TAKE 1 TABLET BY MOUTH  DAILY WITH BREAKFAST (Patient not taking: Reported on 10/15/2019), Disp: 90 tablet, Rfl: 0  No Known Allergies  I personally reviewed active problem list, medication list, allergies, family history, social history, health maintenance with the patient/caregiver today.   ROS  Constitutional: Negative for fever or weight change.  Respiratory: Negative for cough and shortness of breath.   Cardiovascular: Negative for chest pain or palpitations.  Gastrointestinal: Negative for abdominal pain, no bowel changes.  Musculoskeletal: Negative for gait problem or joint swelling.  Skin: Negative for rash.  Neurological: Negative for dizziness or headache.  No other specific complaints in a complete review of systems (except as listed in HPI above).  Objective  Vitals:   10/15/19 0740  BP: 136/78  Pulse: 100  Resp: 16  Temp: (!) 97.3 F (36.3 C)  SpO2: 96%  Weight: 202 lb 6.4 oz (91.8 kg)  Height: 5\' 6"  (1.676 m)    Body mass index is 32.67 kg/m.  Physical Exam  Constitutional: Patient appears well-developed and well-nourished. Obese No distress.  HEENT: head atraumatic, normocephalic, pupils equal and reactive to light Cardiovascular: Normal rate, regular rhythm and normal heart sounds.  No murmur heard. No BLE edema. Pulmonary/Chest: Effort normal and breath sounds normal. No respiratory distress. Abdominal: Soft.   There is no tenderness. Psychiatric: Patient has a normal mood and affect. behavior is normal. Judgment and thought content normal.  Recent Results (from the past 2160 hour(s))  ANA,IFA RA Diag Pnl w/rflx Tit/Patn     Status: None   Collection Time: 07/24/19  9:26 AM  Result Value Ref Range   ANA Titer 1 Negative     Comment:  Negative   <1:80                                      Borderline  1:80                                      Positive   >1:80    Rhuematoid fact SerPl-aCnc <10.0 0.0 - [id number] IU/mL   Cyclic Citrullin Peptide Ab 3 0 - 19 units    Comment:                           Negative               <20                           Weak positive      20 - 39                           Moderate positive  40 - 59                           Strong positive        >59   CBC with Differential/Platelet     Status: Abnormal   Collection Time: 07/24/19  9:27 AM  Result Value Ref Range   WBC 4.8 3.4 - 10.8 x10E3/uL   RBC 5.74 4.14 - 5.80 x10E6/uL   Hemoglobin 16.2 13.0 - 17.7 g/dL   Hematocrit 47.6 37.5 - 51.0 %   MCV 83 79 - 97 fL   MCH 28.2 26.6 - 33.0 pg   MCHC 34.0 31.5 - 35.7 g/dL   RDW 12.1 11.6 - 15.4 %   Platelets 222 150 - 450 x10E3/uL   Neutrophils 40 Not Estab. %   Lymphs 35 Not Estab. %   Monocytes 12 Not Estab. %   Eos 12 Not Estab. %   Basos 1 Not Estab. %   Neutrophils Absolute 1.9 1.4 - 7.0 x10E3/uL   Lymphocytes Absolute 1.7 0.7 - 3.1 x10E3/uL   Monocytes Absolute 0.6 0.1 - 0.9 x10E3/uL   EOS (ABSOLUTE) 0.6 (H) 0.0 - 0.4 x10E3/uL   Basophils Absolute 0.0 0.0 - 0.2 x10E3/uL   Immature Granulocytes 0 Not Estab. %   Immature Grans (Abs) 0.0 0.0 - 0.1 x10E3/uL  Hemoglobin A1c     Status: Abnormal   Collection Time: 07/24/19  9:27 AM  Result Value Ref Range   Hgb A1c MFr Bld 6.0 (H) 4.8 - 5.6 %    Comment:          Prediabetes: 5.7 - 6.4          Diabetes: >6.4          Glycemic control for adults with diabetes: <7.0    Est.  average glucose Bld gHb Est-mCnc 126 mg/dL  Comprehensive metabolic panel     Status: Abnormal   Collection Time: 07/24/19  9:27 AM  Result Value Ref Range   Glucose 98 65 - 99 mg/dL   BUN 19 8 - 27 mg/dL   Creatinine, Ser 1.40 (H) 0.76 - 1.27 mg/dL   GFR calc non Af Amer 51 (L) >  59 mL/min/1.73   GFR calc Af Amer 59 (L) >59 mL/min/1.73   BUN/Creatinine Ratio 14 10 - 24   Sodium 139 134 - 144 mmol/L   Potassium 3.7 3.5 - 5.2 mmol/L   Chloride 100 96 - 106 mmol/L   CO2 23 20 - 29 mmol/L   Calcium 10.1 8.6 - 10.2 mg/dL   Total Protein 6.8 6.0 - 8.5 g/dL   Albumin 4.4 3.8 - 4.8 g/dL   Globulin, Total 2.4 1.5 - 4.5 g/dL   Albumin/Globulin Ratio 1.8 1.2 - 2.2   Bilirubin Total 0.6 0.0 - 1.2 mg/dL   Alkaline Phosphatase 64 39 - 117 IU/L   AST 31 0 - 40 IU/L   ALT 33 0 - 44 IU/L  Lipid panel     Status: Abnormal   Collection Time: 07/24/19  9:27 AM  Result Value Ref Range   Cholesterol, Total 146 100 - 199 mg/dL   Triglycerides 88 0 - 149 mg/dL   HDL 33 (L) >39 mg/dL   VLDL Cholesterol Cal 18 5 - 40 mg/dL   LDL Calculated 95 0 - 99 mg/dL   Chol/HDL Ratio 4.4 0.0 - 5.0 ratio    Comment:                                   T. Chol/HDL Ratio                                             Men  Women                               1/2 Avg.Risk  3.4    3.3                                   Avg.Risk  5.0    4.4                                2X Avg.Risk  9.6    7.1                                3X Avg.Risk 23.4   11.0   SAR CoV2 Serology (COVID 19)AB(IGG)IA     Status: None   Collection Time: 07/24/19  9:27 AM  Result Value Ref Range   SARS-CoV-2 Ab, IgG Negative Negative    Comment: This sample does not contain detectable SARS-CoV-2 IgG antibodies. This negative result does not rule out SARS-CoV-2 infection. Correlation with epidemiologic risk factors and other clinical and laboratory findings is recommended. Serologic results should not be used as the sole basis to diagnose or exclude  recent SARS-CoV-2 infection. This assay was performed using the Abbott SARS-CoV-2 IgG assay.   Uric acid     Status: None   Collection Time: 07/24/19  9:27 AM  Result Value Ref Range   Uric Acid 5.2 3.7 - 8.6 mg/dL    Comment:            Therapeutic target for gout patients: <6.0  VITAMIN D 25 Hydroxy (Vit-D  Deficiency, Fractures)     Status: Abnormal   Collection Time: 07/24/19  9:27 AM  Result Value Ref Range   Vit D, 25-Hydroxy 23.5 (L) 30.0 - 100.0 ng/mL    Comment: Vitamin D deficiency has been defined by the [place] practice guideline as a level of serum 25-OH vitamin D less than 20 ng/mL (1,2). The Endocrine Society went on to further define vitamin D insufficiency as a level between 21 and 29 ng/mL (2). 1. IOM (Institute of Medicine). 2010. Dietary reference    intakes for calcium and D. [place]: The    [employer]. 2. Holick MF, [place], Bischoff-Ferrari HA, et al.    Evaluation, treatment, and prevention of vitamin D    deficiency: an Endocrine Society clinical practice    guideline. JCEM. 2011 Jul; 96(7):1911-30.   B12 and Folate Panel     Status: None   Collection Time: 07/24/19  9:27 AM  Result Value Ref Range   Vitamin B-12 328 232 - 1,245 pg/mL   Folate 8.8 >3.0 ng/mL    Comment: A serum folate concentration of less than 3.1 ng/mL is considered to represent clinical deficiency.       PHQ2/9: Depression screen [facility] 2/9 10/15/2019 07/15/2019 03/26/2019 01/27/2019 09/23/2018  Decreased Interest 0 0 0 0 0  Down, Depressed, Hopeless 0 0 0 0 0  PHQ - 2 Score 0 0 0 0 0  Altered sleeping 0 0 0 - -  Tired, decreased energy 0 0 0 - -  Change in appetite 0 0 0 - -  Feeling bad or failure about yourself  0 0 0 - -  Trouble concentrating 0 0 0 - -  Moving slowly or fidgety/restless 0 0 0 - -  Suicidal thoughts 0 0 0 - -  PHQ-9 Score 0 0 0 - -  Difficult doing work/chores Not difficult at all Not difficult at  all - - -    phq 9 is negative   Fall Risk: Fall Risk  10/15/2019 07/15/2019 03/26/2019 01/27/2019 12/23/2018  Falls in the past year? 0 0 0 0 1  Number falls in past yr: 0 0 0 0 0  Injury with Fall? 0 0 0 0 0  Risk for fall due to : - - - - Impaired balance/gait  Follow up Falls evaluation completed - - - -      Assessment & Plan  1. Dyslipidemia  Continue statin therapy   2. Prediabetes  Discussed low carbohydrate diet   3. Essential hypertension  - Candesartan Cilexetil-HCTZ 32-25 MG TABS; Take 1 tablet by mouth daily.  Dispense: 90 tablet; Refill: 1  4. Controlled gout   5. Morbid obesity ([place])  Discussed with the patient the risk posed by an increased BMI. Discussed importance of portion control, calorie counting and at least 150 minutes of physical activity weekly. Avoid sweet beverages and drink more water. Eat at least 6 servings of fruit and vegetables daily   6. Vitamin D deficiency  Continue vitamin D supplementation   7. Need for influenza vaccination  - Flu Vaccine QUAD High Dose(Fluad)  8. Need for 23-polyvalent pneumococcal polysaccharide vaccine  - Pneumococcal polysaccharide vaccine 23-valent greater than or equal to 2yo subcutaneous/IM  9. Low vitamin B12 level  - Cyanocobalamin (B-12) 1000 MCG SUBL; Place 1 tablet under the tongue daily.  Dispense: 30 tablet; Refill: 0

## 2020-02-15 ENCOUNTER — Ambulatory Visit: Payer: Managed Care, Other (non HMO) | Admitting: Family Medicine

## 2020-04-15 ENCOUNTER — Ambulatory Visit: Payer: Managed Care, Other (non HMO) | Admitting: Family Medicine

## 2020-04-22 ENCOUNTER — Ambulatory Visit: Payer: Managed Care, Other (non HMO) | Admitting: Family Medicine

## 2020-05-04 ENCOUNTER — Other Ambulatory Visit: Payer: Self-pay

## 2020-05-04 ENCOUNTER — Ambulatory Visit: Payer: Managed Care, Other (non HMO) | Admitting: Family Medicine

## 2020-05-04 ENCOUNTER — Encounter: Payer: Self-pay | Admitting: Family Medicine

## 2020-05-04 VITALS — BP 104/60 | HR 95 | Temp 97.1°F | Resp 16 | Ht 66.5 in | Wt 207.1 lb

## 2020-05-04 DIAGNOSIS — Z1211 Encounter for screening for malignant neoplasm of colon: Secondary | ICD-10-CM

## 2020-05-04 DIAGNOSIS — E785 Hyperlipidemia, unspecified: Secondary | ICD-10-CM

## 2020-05-04 DIAGNOSIS — E559 Vitamin D deficiency, unspecified: Secondary | ICD-10-CM

## 2020-05-04 DIAGNOSIS — E538 Deficiency of other specified B group vitamins: Secondary | ICD-10-CM

## 2020-05-04 DIAGNOSIS — R7303 Prediabetes: Secondary | ICD-10-CM

## 2020-05-04 DIAGNOSIS — Z86010 Personal history of colonic polyps: Secondary | ICD-10-CM

## 2020-05-04 DIAGNOSIS — I10 Essential (primary) hypertension: Secondary | ICD-10-CM | POA: Diagnosis not present

## 2020-05-04 DIAGNOSIS — M109 Gout, unspecified: Secondary | ICD-10-CM

## 2020-05-04 DIAGNOSIS — R0789 Other chest pain: Secondary | ICD-10-CM

## 2020-05-04 MED ORDER — CANDESARTAN CILEXETIL-HCTZ 32-25 MG PO TABS: 1.0000 | ORAL_TABLET | Freq: Every day | ORAL | 1 refills | Status: DC

## 2020-05-04 MED ORDER — ROSUVASTATIN CALCIUM 10 MG PO TABS: 10.0000 mg | ORAL_TABLET | Freq: Every day | ORAL | 1 refills | Status: DC

## 2020-05-04 NOTE — Progress Notes (Signed)
Name: Fernando Hart   MRN: [id number]    DOB: 1951/01/13   Date:05/04/2020       Progress Note  Subjective  Chief Complaint  Chief Complaint  Patient presents with  . Hypertension  . Hyperlipidemia  . Prediabetes  . Chest Pain    He has been experiencing some chest pain x 1 week that comes and goes. He is unsure if it is his ribs or were it is coming from. Denies other symptoms.    HPI   Chest pain: he noticed intermittent chest pain over the past week, the pain is described as a stabbing pain intense pain on left side of chest that lasts just a few seconds, starts in the sub-sternal area and radiates to left upper chest. Not associated with sob, diaphoresis or nausea. Symptoms can happen at rest or during activity. He states he had about 5-6 episodes. He quit smoking in 2005, no significant family history of heart disease. He states that over the past 3 weeks he has been taking menthol halls about 4 times daily .   HTN: he is compliant with bp medication, he denies chest pain or palpitation, bp is at goal. His bp was elevated when he first came in but went down with rest.   AR: he has itchy eyes, rhinorrhea, scratch throat, always present this time of the year, taking otc loratadine, advised to double the dose, he also uses an eye drop otc to control symptoms but would consider a rx eye drop.   Joint stiffness: he is waking up feeling stiff, but improves throughout the day, discussed importance of walking outside daily   Hyperlipidemia:he is now taking statins, no significant changes on body aches. We will recheck labs today   Hyperglycemia: Fernando Hart longer has episodes of polyphagia alsodenies polydipsia or polyuria. He was taking Metformin and changed diet, however we stopped it last visit because he was feeling tired and groggy around 11 am, hgbA1C was 6.4% went down to 5.7% but up again to 6%, he has been working from home , advised importance of physical activity, he is thinking  about doing a whole 30 diet  Gout: he stopped Uloric on his own a couple of months ago and no symptoms , last uric was still normal, stay off medication and monitor for symptoms . No episodes in a long time   Obesity: he has multiple co-morbidities.He has gained about 5 lbs since last visit.   Patient Active Problem List   Diagnosis Date Noted  . Intermittent low back pain 03/24/2018  . Hyperlipidemia 12/23/2017  . Tinnitus of right ear 07/12/2017  . Dyslipidemia 10/11/2016  . Obesity 10/11/2016  . Degenerative joint disease of right hip 02/28/2016  . Prediabetes 01/10/2016  . Gout 01/10/2016  . Essential hypertension 01/10/2016    Past Surgical History:  Procedure Laterality Date  . NO PAST SURGERIES    . ROOT CANAL      Family History  Problem Relation Age of Onset  . Hypertension Mother   . Diabetes Father   . Diabetes Sister   . Sudden death Daughter     Social History   Tobacco Use  . Smoking status: Former Smoker    Packs/day: 1.00    Years: 35.00    Pack years: 35.00    Types: Cigarettes    Quit date: 07/2004    Years since quitting: 15.8  . Smokeless tobacco: Never Used  Substance Use Topics  . Alcohol use: Yes  Alcohol/week: 1.0 standard drinks    Types: 1 Shots of liquor per week    Comment: not every week     Current Outpatient Medications:  .  aspirin EC 81 MG tablet, Take 1 tablet (81 mg total) by mouth daily., Disp: 30 tablet, Rfl: 0 .  Candesartan Cilexetil-HCTZ 32-25 MG TABS, Take 1 tablet by mouth daily., Disp: 90 tablet, Rfl: 1 .  Cholecalciferol (VITAMIN D PO), Take 1,000 Units by mouth daily., Disp: , Rfl:  .  Cyanocobalamin (B-12) 1000 MCG SUBL, Place 1 tablet under the tongue daily., Disp: 30 tablet, Rfl: 0 .  rosuvastatin (CRESTOR) 10 MG tablet, TAKE 1 TABLET BY MOUTH  DAILY, Disp: 90 tablet, Rfl: 1  No Known Allergies  I personally reviewed active problem list, medication list, allergies, family history, social history, health  maintenance with the patient/caregiver today.   ROS  Constitutional: Negative for fever , or significant  weight change.  Respiratory: Negative for cough and shortness of breath.   Cardiovascular: Positive  For intermittent  chest pain but no  palpitations.  Gastrointestinal: Negative for abdominal pain, no bowel changes.  Musculoskeletal: Negative for gait problem or joint swelling.  Skin: Negative for rash.  Neurological: Negative for dizziness or headache.  No other specific complaints in a complete review of systems (except as listed in HPI above).  Objective  Vitals:   05/04/20 0841 05/04/20 0855  BP: 140/70 104/60  Pulse: 95   Resp: 16   Temp: (!) 97.1 F (36.2 C)   TempSrc: Temporal   SpO2: 97%   Weight: 207 lb 1.6 oz (93.9 kg)   Height: 5' 6.5" (1.689 m)     Body mass index is 32.93 kg/m.  Physical Exam  Constitutional: Patient appears well-developed and well-nourished. Obese  No distress.  HEENT: head atraumatic, normocephalic, pupils equal and reactive to light,  neck supple Cardiovascular: Normal rate, regular rhythm and normal heart sounds.  No murmur heard. No BLE edema. Pulmonary/Chest: Effort normal and breath sounds normal. No respiratory distress. Abdominal: Soft.  There is no tenderness. Psychiatric: Patient has a normal mood and affect. behavior is normal. Judgment and thought content normal.  PHQ2/9: Depression screen [facility] 2/9 05/04/2020 10/15/2019 07/15/2019 03/26/2019 01/27/2019  Decreased Interest 0 0 0 0 0  Down, Depressed, Hopeless 0 0 0 0 0  PHQ - 2 Score 0 0 0 0 0  Altered sleeping 0 0 0 0 -  Tired, decreased energy 0 0 0 0 -  Change in appetite 0 0 0 0 -  Feeling bad or failure about yourself  0 0 0 0 -  Trouble concentrating 0 0 0 0 -  Moving slowly or fidgety/restless 0 0 0 0 -  Suicidal thoughts 0 0 0 0 -  PHQ-9 Score 0 0 0 0 -  Difficult doing work/chores - Not difficult at all Not difficult at all - -    phq 9 is positive   Fall  Risk: Fall Risk  05/04/2020 10/15/2019 07/15/2019 03/26/2019 01/27/2019  Falls in the past year? 0 0 0 0 0  Number falls in past yr: 0 0 0 0 0  Injury with Fall? 0 0 0 0 0  Risk for fall due to : - - - - -  Follow up - Falls evaluation completed - - -     Functional Status Survey: Is the patient deaf or have difficulty hearing?: No Does the patient have difficulty seeing, even when wearing glasses/contacts?: No Does the patient  have difficulty concentrating, remembering, or making decisions?: No Does the patient have difficulty walking or climbing stairs?: No Does the patient have difficulty dressing or bathing?: No Does the patient have difficulty doing errands alone such as visiting a doctor's office or shopping?: No    Assessment & Plan  1. Dyslipidemia  - Lipid panel - rosuvastatin (CRESTOR) 10 MG tablet; Take 1 tablet (10 mg total) by mouth daily.  Dispense: 90 tablet; Refill: 1  2. Essential hypertension  - COMPLETE METABOLIC PANEL WITH GFR - CBC with Differential/Platelet - Candesartan Cilexetil-HCTZ 32-25 MG TABS; Take 1 tablet by mouth daily.  Dispense: 90 tablet; Refill: 1  3. Low vitamin B12 level  - Vitamin B12  4. Controlled gout   5. Prediabetes  - Hemoglobin A1c  6. Atypical chest pain  - EKG 12-Lead  7. Vitamin D deficiency  - VITAMIN D 25 Hydroxy (Vit-D Deficiency, Fractures)  8. Colon cancer screening  - Ambulatory referral to Gastroenterology  9. History of colon polyps  - Ambulatory referral to Gastroenterology

## 2020-10-14 LAB — COMPREHENSIVE METABOLIC PANEL
ALT: 37 IU/L (ref 0–44)
AST: 28 IU/L (ref 0–40)
Albumin/Globulin Ratio: 1.8 (ref 1.2–2.2)
Albumin: 4.3 g/dL (ref 3.8–4.8)
Alkaline Phosphatase: 70 IU/L (ref 44–121)
BUN/Creatinine Ratio: 11 (ref 10–24)
BUN: 17 mg/dL (ref 8–27)
Bilirubin Total: 0.4 mg/dL (ref 0.0–1.2)
CO2: 23 mmol/L (ref 20–29)
Calcium: 10.1 mg/dL (ref 8.6–10.2)
Chloride: 100 mmol/L (ref 96–106)
Creatinine, Ser: 1.48 mg/dL — ABNORMAL HIGH (ref 0.76–1.27)
GFR calc Af Amer: 55 mL/min/{1.73_m2} — ABNORMAL LOW (ref >59)
GFR calc non Af Amer: 47 mL/min/{1.73_m2} — ABNORMAL LOW (ref >59)
Globulin, Total: 2.4 g/dL (ref 1.5–4.5)
Glucose: 100 mg/dL — ABNORMAL HIGH (ref 65–99)
Potassium: 3.8 mmol/L (ref 3.5–5.2)
Sodium: 139 mmol/L (ref 134–144)
Total Protein: 6.7 g/dL (ref 6.0–8.5)

## 2020-10-14 LAB — LIPID PANEL
Chol/HDL Ratio: 4.6 ratio (ref 0.0–5.0)
Cholesterol, Total: 156 mg/dL (ref 100–199)
HDL: 34 mg/dL — ABNORMAL LOW (ref >39)
LDL Chol Calc (NIH): 98 mg/dL (ref 0–99)
Triglycerides: 135 mg/dL (ref 0–149)
VLDL Cholesterol Cal: 24 mg/dL (ref 5–40)

## 2020-10-14 LAB — HEMOGLOBIN A1C
Est. average glucose Bld gHb Est-mCnc: 131 mg/dL
Hgb A1c MFr Bld: 6.2 % — ABNORMAL HIGH (ref 4.8–5.6)

## 2020-10-14 LAB — CBC WITH DIFFERENTIAL/PLATELET
Basophils Absolute: 0.1 10*3/uL (ref 0.0–0.2)
Basos: 1 %
EOS (ABSOLUTE): 0.6 10*3/uL — ABNORMAL HIGH (ref 0.0–0.4)
Eos: 10 %
Hematocrit: 46.5 % (ref 37.5–51.0)
Hemoglobin: 15.4 g/dL (ref 13.0–17.7)
Immature Grans (Abs): 0 10*3/uL (ref 0.0–0.1)
Immature Granulocytes: 1 %
Lymphocytes Absolute: 2.1 10*3/uL (ref 0.7–3.1)
Lymphs: 37 %
MCH: 28.4 pg (ref 26.6–33.0)
MCHC: 33.1 g/dL (ref 31.5–35.7)
MCV: 86 fL (ref 79–97)
Monocytes Absolute: 0.8 10*3/uL (ref 0.1–0.9)
Monocytes: 13 %
Neutrophils Absolute: 2.2 10*3/uL (ref 1.4–7.0)
Neutrophils: 38 %
Platelets: 210 10*3/uL (ref 150–450)
RBC: 5.43 x10E6/uL (ref 4.14–5.80)
RDW: 12.5 % (ref 11.6–15.4)
WBC: 5.7 10*3/uL (ref 3.4–10.8)

## 2020-10-14 LAB — VITAMIN B12: Vitamin B-12: >2000 pg/mL — ABNORMAL HIGH (ref 232–1245)

## 2020-10-14 LAB — VITAMIN D 25 HYDROXY (VIT D DEFICIENCY, FRACTURES): Vit D, 25-Hydroxy: 56.4 ng/mL (ref 30.0–100.0)

## 2020-11-03 NOTE — Progress Notes (Signed)
Name: Fernando Hart   MRN: [id number]    DOB: 19-Jan-1951   Date:11/04/2020       Progress Note  Subjective  Chief Complaint  Annual Exam  HPI  Patient presents for annual CPE and follow up  HTN: he is compliant with bp medication, he denies chest pain , palpitation or dizziness. BP is at goal today  AR: he has itchy eyes, rhinorrhea, scratch throat, always present this time of the year, taking otc loratadine, advised to double the dose, he also uses an eye drop otc to control symptoms but would consider a rx eye drop.   Joint stiffness: he is waking up feeling stiff, but improves throughout the day, he has been taking Aleve a few times a week, discussed changing to Tylenol due to drop on GFR  CKI stage III: discussed importance of cutting down on NSAID's and taking Tylenol instead of pain. He denies pruritis, good urine output    Hyperlipidemia:he is now taking statins, no significant changes on body aches. Reviewed last labs   Hyperglycemia: Heno longer has episodes of polyphagia alsodenies polydipsia or polyuria. Hewastaking Metformin but stopped for a while, but resumed it recently and would like a refill   Gout: he stopped Uloric on his own a couple of months ago and no symptoms, last uric was still normal, stay off medication and monitor for symptoms. We will recheck labs today   Obesity: he has multiple co-morbidities.He has gained about 5 lbs since last visi IPSS Questionnaire (AUA-7): Over the past month.   1)  How often have you had a sensation of not emptying your bladder completely after you finish urinating?  0- Not at all  2)  How often have you had to urinate again less than two hours after you finished urinating? 1- Less than 1 time  3)  How often have you found you stopped and started again several times when you urinated?  1- Less than 1 time  4) How difficult have you found it to postpone urination?  0- Not at all  5) How often have you had a weak  urinary stream?  0- Not at all  6) How often have you had to push or strain to begin urination?  0- Not at all  7) How many times did you most typically get up to urinate from the time you went to bed until the time you got up in the morning?  1- One time  Total score:  0-7 mildly symptomatic   8-19 moderately symptomatic   20-35 severely symptomatic     Diet: eats at home  Exercise: he has started walking in the am's with a neighbor   Depression: phq 9 is negative Depression screen [facility] 2/9 11/04/2020 05/04/2020 10/15/2019 07/15/2019 03/26/2019  Decreased Interest 0 0 0 0 0  Down, Depressed, Hopeless 0 0 0 0 0  PHQ - 2 Score 0 0 0 0 0  Altered sleeping 1 0 0 0 0  Tired, decreased energy 0 0 0 0 0  Change in appetite 0 0 0 0 0  Feeling bad or failure about yourself  0 0 0 0 0  Trouble concentrating 0 0 0 0 0  Moving slowly or fidgety/restless 0 0 0 0 0  Suicidal thoughts 0 0 0 0 0  PHQ-9 Score 1 0 0 0 0  Difficult doing work/chores Not difficult at all - Not difficult at all Not difficult at all -    Hypertension:  BP Readings  from Last 3 Encounters:  11/04/20 128/72  05/04/20 104/60  10/15/19 136/78    Obesity: Wt Readings from Last 3 Encounters:  11/04/20 204 lb 1.6 oz (92.6 kg)  05/04/20 207 lb 1.6 oz (93.9 kg)  10/15/19 202 lb 6.4 oz (91.8 kg)   BMI Readings from Last 3 Encounters:  11/04/20 31.97 kg/m  05/04/20 32.93 kg/m  10/15/19 32.67 kg/m     Lipids:  Lab Results  Component Value Date   CHOL 156 10/13/2020   CHOL 146 07/24/2019   CHOL 173 07/31/2018   Lab Results  Component Value Date   HDL 34 (L) 10/13/2020   HDL 33 (L) 07/24/2019   HDL 34 (A) 07/31/2018   Lab Results  Component Value Date   LDLCALC 98 10/13/2020   LDLCALC 95 07/24/2019   LDLCALC 114 07/31/2018   Lab Results  Component Value Date   TRIG 135 10/13/2020   TRIG 88 07/24/2019   TRIG 123 07/31/2018   Lab Results  Component Value Date   CHOLHDL 4.6 10/13/2020   CHOLHDL 4.4  07/24/2019   CHOLHDL 4.5 05/09/2018   No results found for: LDLDIRECT Glucose:  Glucose  Date Value Ref Range Status  10/13/2020 100 (H) 65 - 99 mg/dL Final  07/24/2019 98 65 - 99 mg/dL Final  05/09/2018 80 65 - 99 mg/dL Final      Office Visit from 11/04/2020 in [facility]  AUDIT-C Score 1      Divorced STD testing and prevention (HIV/chl/gon/syphilis): not interested  Hep C: 03/13/2017  Skin cancer: Discussed monitoring for atypical lesions Colorectal cancer: 04/16/2017, order placed this visit  Prostate cancer: N/A   Lung cancer:  Low Dose CT Chest recommended if Age 71-80 years, 30 pack-year currently smoking OR have quit w/in 15years. Patient does not qualify.   AAA: The USPSTF recommends one-time screening with ultrasonography in men ages 83 to 47 years who have ever smoked ECG:  05/04/2020  Vaccines:   Shingrix: 9-64 yo and ask insurance if covered when patient above 37 yo Pneumonia: Completed requirements, educated and discussed with patient. Flu: Given at today's visit, educated and discussed with patient.  Advanced Care Planning: A voluntary discussion about advance care planning including the explanation and discussion of advance directives.  Discussed health care proxy and Living will, and the patient was able to identify a health care proxy as oldest daugther  .  Patient does not have a living will at present time.   Patient Active Problem List   Diagnosis Date Noted  . Stage 3a chronic kidney disease ([place]) 11/04/2020  . Vitamin D deficiency 11/04/2020  . Intermittent low back pain 03/24/2018  . Hyperlipidemia 12/23/2017  . Tinnitus of right ear 07/12/2017  . Dyslipidemia 10/11/2016  . Obesity 10/11/2016  . Degenerative joint disease of right hip 02/28/2016  . Prediabetes 01/10/2016  . Controlled gout 01/10/2016  . Essential hypertension 01/10/2016    Past Surgical History:  Procedure Laterality Date  . NO PAST SURGERIES    .  ROOT CANAL      Family History  Problem Relation Age of Onset  . Hypertension Mother   . Diabetes Father   . Diabetes Sister   . Sudden death Daughter     Social History   Socioeconomic History  . Marital status: Divorced    Spouse name: Not on file  . Number of children: 3  . Years of education: Not on file  . Highest education level: Associate degree: occupational,  technical, or vocational program  Occupational History  . Occupation: IT   Tobacco Use  . Smoking status: Former Smoker    Packs/day: 1.00    Years: 35.00    Pack years: 35.00    Types: Cigarettes    Quit date: 07/2004    Years since quitting: 16.3  . Smokeless tobacco: Never Used  Vaping Use  . Vaping Use: Never used  Substance and Sexual Activity  . Alcohol use: Yes    Alcohol/week: 1.0 standard drink    Types: 1 Shots of liquor per week    Comment: not every week  . Drug use: Yes  . Sexual activity: Yes    Partners: Female    Comment: partner is post-menopausal  Other Topics Concern  . Not on file  Social History Narrative   Divorced many years ago, dating same person for 25 years she lives in [place] but they thinking about getting married   He had  3 grown children, youngest daughter died suddenly at age 8 from unknown at her gym .    8 grandchildren.    Two daughters living in [place]   Social Determinants of Health   Financial Resource Strain: Low Risk   . Difficulty of Paying Living Expenses: Not hard at all  Food Insecurity: No Food Insecurity  . Worried About Charity fundraiser in the Last Year: Never true  . Ran Out of Food in the Last Year: Never true  Transportation Needs: No Transportation Needs  . Lack of Transportation (Medical): No  . Lack of Transportation (Non-Medical): No  Physical Activity: Insufficiently Active  . Days of Exercise per Week: 5 days  . Minutes of Exercise per Session: 20 min  Stress: No Stress Concern Present  . Feeling of Stress : Only a little  Social  Connections: Socially Isolated  . Frequency of Communication with Friends and Family: More than three times a week  . Frequency of Social Gatherings with Friends and Family: More than three times a week  . Attends Religious Services: Never  . Active Member of Clubs or Organizations: No  . Attends Archivist Meetings: Never  . Marital Status: Divorced  Human resources officer Violence: Not At Risk  . Fear of Current or Ex-Partner: No  . Emotionally Abused: No  . Physically Abused: No  . Sexually Abused: No     Current Outpatient Medications:  .  aspirin EC 81 MG tablet, Take 1 tablet (81 mg total) by mouth daily., Disp: 30 tablet, Rfl: 0 .  Candesartan Cilexetil-HCTZ 32-25 MG TABS, Take 1 tablet by mouth daily., Disp: 90 tablet, Rfl: 1 .  Cholecalciferol (VITAMIN D PO), Take 1,000 Units by mouth daily., Disp: , Rfl:  .  metFORMIN (GLUCOPHAGE-XR) 500 MG 24 hr tablet, Take 1 tablet (500 mg total) by mouth every evening., Disp: 90 tablet, Rfl: 1 .  rosuvastatin (CRESTOR) 10 MG tablet, Take 1 tablet (10 mg total) by mouth daily., Disp: 90 tablet, Rfl: 1  No Known Allergies   ROS  Constitutional: Negative for fever or weight change.  Respiratory: Negative for cough and shortness of breath.   Cardiovascular: Negative for chest pain or palpitations.  Gastrointestinal: Negative for abdominal pain, no bowel changes.  Musculoskeletal: Negative for gait problem or joint swelling.  Skin: Negative for rash.  Neurological: Negative for dizziness or headache.  No other specific complaints in a complete review of systems (except as listed in HPI above).  Objective  Vitals:   11/04/20  0755  BP: 128/72  Pulse: 91  Resp: 17  Temp: 98.1 F (36.7 C)  TempSrc: Oral  SpO2: 98%  Weight: 204 lb 1.6 oz (92.6 kg)  Height: _0  (1.702 m)    Body mass index is 31.97 kg/m.  Physical Exam  Constitutional: Patient appears well-developed and obesity. No distress.  HENT: Head: Normocephalic  and atraumatic. Ears: B TMs ok, no erythema or effusion; Nose: Not done. Mouth/Throat: not done  Eyes: Conjunctivae and EOM are normal. Pupils are equal, round, and reactive to light. No scleral icterus.  Neck: Normal range of motion. Neck supple. No JVD present. No thyromegaly present.  Cardiovascular: Normal rate, regular rhythm and normal heart sounds.  No murmur heard. No BLE edema. Pulmonary/Chest: Effort normal and breath sounds normal. No respiratory distress. Abdominal: Soft. Bowel sounds are normal, no distension. There is no tenderness. no masses MALE GENITALIA: Normal descended testes bilaterally, no masses palpated, no hernias, no lesions, no discharge RECTAL: Prostate slightly enlarged but normal  consistency, no rectal masses or hemorrhoids  Musculoskeletal: Normal range of motion, no joint effusions. No gross deformities Neurological: he is alert and oriented to person, place, and time. No cranial nerve deficit. Coordination, balance, strength, speech and gait are normal.  Skin: Skin is warm and dry. No rash noted. No erythema.  Psychiatric: Patient has a normal mood and affect. behavior is normal. Judgment and thought content normal.  Recent Results (from the past 2160 hour(s))  Comprehensive metabolic panel     Status: Abnormal   Collection Time: 10/13/20  8:45 AM  Result Value Ref Range   Glucose 100 (H) 65 - 99 mg/dL   BUN 17 8 - 27 mg/dL   Creatinine, Ser 1.48 (H) 0.76 - 1.27 mg/dL   GFR calc non Af Amer 47 (L) >59 mL/min/1.73   GFR calc Af Amer 55 (L) >59 mL/min/1.73    Comment: **In accordance with recommendations from the NKF-ASN Task force,**   [facility] is in the process of updating its eGFR calculation to the   2021 CKD-EPI creatinine equation that estimates kidney function   without a race variable.    BUN/Creatinine Ratio 11 10 - 24   Sodium 139 134 - 144 mmol/L   Potassium 3.8 3.5 - 5.2 mmol/L   Chloride 100 96 - 106 mmol/L   CO2 23 20 - 29 mmol/L   Calcium  10.1 8.6 - 10.2 mg/dL   Total Protein 6.7 6.0 - 8.5 g/dL   Albumin 4.3 3.8 - 4.8 g/dL   Globulin, Total 2.4 1.5 - 4.5 g/dL   Albumin/Globulin Ratio 1.8 1.2 - 2.2   Bilirubin Total 0.4 0.0 - 1.2 mg/dL   Alkaline Phosphatase 70 44 - 121 IU/L    Comment:               **Please note reference interval change**   AST 28 0 - 40 IU/L   ALT 37 0 - 44 IU/L  CBC with Differential/Platelet     Status: Abnormal   Collection Time: 10/13/20  8:46 AM  Result Value Ref Range   WBC 5.7 3.4 - 10.8 x10E3/uL   RBC 5.43 4.14 - 5.80 x10E6/uL   Hemoglobin 15.4 13.0 - 17.7 g/dL   Hematocrit 46.5 37.5 - 51.0 %   MCV 86 79 - 97 fL   MCH 28.4 26.6 - 33.0 pg   MCHC 33.1 31 - 35 g/dL   RDW 12.5 11.6 - 15.4 %   Platelets 210 150 - 450  x10E3/uL   Neutrophils 38 Not Estab. %   Lymphs 37 Not Estab. %   Monocytes 13 Not Estab. %   Eos 10 Not Estab. %   Basos 1 Not Estab. %   Neutrophils Absolute 2.2 1.40 - 7.00 x10E3/uL   Lymphocytes Absolute 2.1 0 - 3 x10E3/uL   Monocytes Absolute 0.8 0 - 0 x10E3/uL   EOS (ABSOLUTE) 0.6 (H) 0.0 - 0.4 x10E3/uL   Basophils Absolute 0.1 0 - 0 x10E3/uL   Immature Granulocytes 1 Not Estab. %   Immature Grans (Abs) 0.0 0.0 - 0.1 x10E3/uL  Hemoglobin A1c     Status: Abnormal   Collection Time: 10/13/20  8:46 AM  Result Value Ref Range   Hgb A1c MFr Bld 6.2 (H) 4.8 - 5.6 %    Comment:          Prediabetes: 5.7 - 6.4          Diabetes: >6.4          Glycemic control for adults with diabetes: <7.0    Est. average glucose Bld gHb Est-mCnc 131 mg/dL  Lipid panel     Status: Abnormal   Collection Time: 10/13/20  8:46 AM  Result Value Ref Range   Cholesterol, Total 156 100 - 199 mg/dL   Triglycerides 135 0 - 149 mg/dL   HDL 34 (L) >39 mg/dL   VLDL Cholesterol Cal 24 5 - 40 mg/dL   LDL Chol Calc (NIH) 98 0 - 99 mg/dL   Chol/HDL Ratio 4.6 0.0 - 5.0 ratio    Comment:                                   T. Chol/HDL Ratio                                             Men  Women                                1/2 Avg.Risk  3.4    3.3                                   Avg.Risk  5.0    4.4                                2X Avg.Risk  9.6    7.1                                3X Avg.Risk 23.4   11.0   Vitamin B12     Status: Abnormal   Collection Time: 10/13/20  8:46 AM  Result Value Ref Range   Vitamin B-12 >2000 (H) 232 - 1245 pg/mL  VITAMIN D 25 Hydroxy (Vit-D Deficiency, Fractures)     Status: None   Collection Time: 10/13/20  8:46 AM  Result Value Ref Range   Vit D, 25-Hydroxy 56.4 30.0 - 100.0 ng/mL    Comment: Vitamin D deficiency has been defined by the Institute of Medicine and an Endocrine Society practice guideline  as a level of serum 25-OH vitamin D less than 20 ng/mL (1,2). The Endocrine Society went on to further define vitamin D insufficiency as a level between 21 and 29 ng/mL (2). 1. IOM (Institute of Medicine). 2010. Dietary reference    intakes for calcium and D. [place]: The    [employer]. 2. Holick MF, Binkley [place], Bischoff-Ferrari HA, et al.    Evaluation, treatment, and prevention of vitamin D    deficiency: an Endocrine Society clinical practice    guideline. JCEM. 2011 Jul; 96(7):1911-30.      Fall Risk: Fall Risk  11/04/2020 05/04/2020 10/15/2019 07/15/2019 03/26/2019  Falls in the past year? 0 0 0 0 0  Number falls in past yr: 0 0 0 0 0  Injury with Fall? 0 0 0 0 0  Risk for fall due to : - - - - -  Follow up - - Falls evaluation completed - -     Functional Status Survey: Is the patient deaf or have difficulty hearing?: No Does the patient have difficulty seeing, even when wearing glasses/contacts?: No Does the patient have difficulty concentrating, remembering, or making decisions?: No Does the patient have difficulty walking or climbing stairs?: No Does the patient have difficulty dressing or bathing?: No Does the patient have difficulty doing errands alone such as visiting a doctor's office or shopping?:  No    Assessment & Plan  1. Dyslipidemia   2. Essential hypertension  - Candesartan Cilexetil-HCTZ 32-25 MG TABS; Take 1 tablet by mouth daily.  Dispense: 90 tablet; Refill: 1  3. Prediabetes  - metFORMIN (GLUCOPHAGE-XR) 500 MG 24 hr tablet; Take 1 tablet (500 mg total) by mouth every evening.  Dispense: 90 tablet; Refill: 1  4. Need for immunization against influenza  - Flu Vaccine QUAD High Dose(Fluad)  5. Colon cancer screening  - Cologuard  6. Low vitamin B12 level   7. History of colon polyps   8. Controlled gout  - Uric acid  9. Vitamin D deficiency   10. Stage 3a chronic kidney disease ([facility])  - Comprehensive Metabolic Panel (CMET)  11. Need for shingles vaccine   12. History of tobacco use  - VAS [place] AAA DUPLEX; Future  13. Primary osteoarthritis of both knees  - diclofenac Sodium (VOLTAREN) 1 % GEL; Apply 2 g topically 4 (four) times daily.  Dispense: 100 g; Refill: 2  -Prostate cancer screening and PSA options (with potential risks and benefits of testing vs not testing) were discussed along with recent recs/guidelines. -USPSTF grade A and B recommendations reviewed with patient; age-appropriate recommendations, preventive care, screening tests, etc discussed and encouraged; healthy living encouraged; see AVS for patient education given to patient -Discussed importance of 150 minutes of physical activity weekly, eat two servings of fish weekly, eat one serving of tree nuts ( cashews, pistachios, pecans, almonds.Marland Kitchen) every other day, eat 6 servings of fruit/vegetables daily and drink plenty of water and avoid sweet beverages.

## 2020-11-03 NOTE — Patient Instructions (Signed)

## 2020-11-04 ENCOUNTER — Ambulatory Visit (INDEPENDENT_AMBULATORY_CARE_PROVIDER_SITE_OTHER): Payer: Managed Care, Other (non HMO) | Admitting: Family Medicine

## 2020-11-04 ENCOUNTER — Encounter: Payer: Self-pay | Admitting: Family Medicine

## 2020-11-04 ENCOUNTER — Other Ambulatory Visit: Payer: Self-pay

## 2020-11-04 VITALS — BP 128/72 | HR 91 | Temp 98.1°F | Resp 17 | Ht 67.0 in | Wt 204.1 lb

## 2020-11-04 DIAGNOSIS — Z0000 Encounter for general adult medical examination without abnormal findings: Secondary | ICD-10-CM | POA: Diagnosis not present

## 2020-11-04 DIAGNOSIS — Z1211 Encounter for screening for malignant neoplasm of colon: Secondary | ICD-10-CM

## 2020-11-04 DIAGNOSIS — Z86010 Personal history of colonic polyps: Secondary | ICD-10-CM

## 2020-11-04 DIAGNOSIS — E785 Hyperlipidemia, unspecified: Secondary | ICD-10-CM

## 2020-11-04 DIAGNOSIS — E538 Deficiency of other specified B group vitamins: Secondary | ICD-10-CM

## 2020-11-04 DIAGNOSIS — E559 Vitamin D deficiency, unspecified: Secondary | ICD-10-CM

## 2020-11-04 DIAGNOSIS — M109 Gout, unspecified: Secondary | ICD-10-CM

## 2020-11-04 DIAGNOSIS — M170 Bilateral primary osteoarthritis of knee: Secondary | ICD-10-CM

## 2020-11-04 DIAGNOSIS — N1831 Chronic kidney disease, stage 3a: Secondary | ICD-10-CM | POA: Diagnosis not present

## 2020-11-04 DIAGNOSIS — Z23 Encounter for immunization: Secondary | ICD-10-CM | POA: Diagnosis not present

## 2020-11-04 DIAGNOSIS — Z87891 Personal history of nicotine dependence: Secondary | ICD-10-CM

## 2020-11-04 DIAGNOSIS — R7303 Prediabetes: Secondary | ICD-10-CM | POA: Diagnosis not present

## 2020-11-04 DIAGNOSIS — I10 Essential (primary) hypertension: Secondary | ICD-10-CM

## 2020-11-04 MED ORDER — METFORMIN HCL ER 500 MG PO TB24: 500.0000 mg | ORAL_TABLET | Freq: Every evening | ORAL | 1 refills | Status: DC

## 2020-11-04 MED ORDER — CANDESARTAN CILEXETIL-HCTZ 32-25 MG PO TABS: 1.0000 | ORAL_TABLET | Freq: Every day | ORAL | 1 refills | Status: DC

## 2020-11-04 MED ORDER — DICLOFENAC SODIUM 1 % EX GEL: 2.0000 g | Freq: Four times a day (QID) | CUTANEOUS | 2 refills | Status: DC

## 2020-11-30 LAB — COMPREHENSIVE METABOLIC PANEL
ALT: 29 IU/L (ref 0–44)
AST: 22 IU/L (ref 0–40)
Albumin/Globulin Ratio: 1.5 (ref 1.2–2.2)
Albumin: 4.4 g/dL (ref 3.8–4.8)
Alkaline Phosphatase: 76 IU/L (ref 44–121)
BUN/Creatinine Ratio: 13 (ref 10–24)
BUN: 17 mg/dL (ref 8–27)
Bilirubin Total: 0.4 mg/dL (ref 0.0–1.2)
CO2: 24 mmol/L (ref 20–29)
Calcium: 10.1 mg/dL (ref 8.6–10.2)
Chloride: 100 mmol/L (ref 96–106)
Creatinine, Ser: 1.33 mg/dL — ABNORMAL HIGH (ref 0.76–1.27)
GFR calc Af Amer: 62 mL/min/{1.73_m2} (ref >59)
GFR calc non Af Amer: 54 mL/min/{1.73_m2} — ABNORMAL LOW (ref >59)
Globulin, Total: 2.9 g/dL (ref 1.5–4.5)
Glucose: 90 mg/dL (ref 65–99)
Potassium: 3.9 mmol/L (ref 3.5–5.2)
Sodium: 139 mmol/L (ref 134–144)
Total Protein: 7.3 g/dL (ref 6.0–8.5)

## 2020-11-30 LAB — URIC ACID: Uric Acid: 7.8 mg/dL (ref 3.8–8.4)

## 2020-12-02 ENCOUNTER — Encounter: Payer: Self-pay | Admitting: Family Medicine

## 2020-12-05 ENCOUNTER — Other Ambulatory Visit: Payer: Self-pay | Admitting: Family Medicine

## 2020-12-05 DIAGNOSIS — M109 Gout, unspecified: Secondary | ICD-10-CM

## 2020-12-05 DIAGNOSIS — I10 Essential (primary) hypertension: Secondary | ICD-10-CM

## 2020-12-05 MED ORDER — FEBUXOSTAT 40 MG PO TABS: 40.0000 mg | ORAL_TABLET | Freq: Every day | ORAL | 1 refills | Status: DC

## 2021-01-02 ENCOUNTER — Other Ambulatory Visit: Payer: Managed Care, Other (non HMO)

## 2021-01-06 ENCOUNTER — Ambulatory Visit: Payer: Managed Care, Other (non HMO)

## 2021-01-19 ENCOUNTER — Other Ambulatory Visit: Payer: Managed Care, Other (non HMO)

## 2021-01-20 ENCOUNTER — Other Ambulatory Visit: Payer: Self-pay | Admitting: Family Medicine

## 2021-01-20 DIAGNOSIS — M170 Bilateral primary osteoarthritis of knee: Secondary | ICD-10-CM

## 2021-02-01 ENCOUNTER — Other Ambulatory Visit: Payer: Self-pay

## 2021-02-01 ENCOUNTER — Ambulatory Visit (INDEPENDENT_AMBULATORY_CARE_PROVIDER_SITE_OTHER): Payer: Managed Care, Other (non HMO)

## 2021-02-01 DIAGNOSIS — Z87891 Personal history of nicotine dependence: Secondary | ICD-10-CM | POA: Diagnosis not present

## 2021-05-03 NOTE — Progress Notes (Signed)
Name: Fernando Hart   MRN: [id number]    DOB: 12-03-1951   Date:05/04/2021       Progress Note  Subjective  Chief Complaint  Follow Up  HPI  HTN: he is compliant with bp medication, he denies chest pain , palpitation or dizziness. BP is at goal today. Discussed changing medication since he has gout, however he is happy with current regiment   AR: he has itchy eyes, rhinorrhea, scratch throat, always present this time of the year, taking otc loratadine, he states symptoms longer than usual  CKI stage III: discussed importance of cutting down on NSAID's and taking Tylenol instead of pain. He denies pruritis, good urine output  We will recheck lab this fall   Hyperlipidemia:he used to take Crestor occasionally but once he started to take it daily a couple of months ago he developed severe muscle pain and stopped crestor about one month ago. Advised him to try taking it a few times a week   Hyperglycemia: Hedenies polyphagia, polydipsia or polyuria. He has been taking metformin daily lately and would like to have his A1C checked   Gout: he stopped Uloric on his own a couple of months ago and had a flare, he is back on Uloric and no problems.   Change in bowel movements: advised to have a colonoscopy instead of cologuard He states the change is that occasionally has to strain to have a bowel movements, every other month. No blood in stools. He is not sure of the cause. He states he wants to try monitoring his diet first and increase fiber and get cologuard only for now. [place] 3 when he has problems otherwise has [place] 4  Skin tags: he states the ones on his face are bothersome, gets cut when he shaves, we will refer him to dermatologist   Patient Active Problem List   Diagnosis Date Noted  . Stage 3a chronic kidney disease ([place]) 11/04/2020  . Vitamin D deficiency 11/04/2020  . Intermittent low back pain 03/24/2018  . Hyperlipidemia 12/23/2017  . Tinnitus of right ear 07/12/2017   . Dyslipidemia 10/11/2016  . Obesity 10/11/2016  . Degenerative joint disease of right hip 02/28/2016  . Prediabetes 01/10/2016  . Controlled gout 01/10/2016  . Essential hypertension 01/10/2016    Past Surgical History:  Procedure Laterality Date  . NO PAST SURGERIES    . ROOT CANAL      Family History  Problem Relation Age of Onset  . Hypertension Mother   . Diabetes Father   . Diabetes Sister   . Sudden death Daughter     Social History   Tobacco Use  . Smoking status: Former Smoker    Packs/day: 1.00    Years: 35.00    Pack years: 35.00    Types: Cigarettes    Quit date: 07/2004    Years since quitting: 16.8  . Smokeless tobacco: Never Used  Substance Use Topics  . Alcohol use: Yes    Alcohol/week: 1.0 standard drink    Types: 1 Shots of liquor per week    Comment: not every week     Current Outpatient Medications:  .  aspirin EC 81 MG tablet, Take 1 tablet (81 mg total) by mouth daily., Disp: 30 tablet, Rfl: 0 .  Candesartan Cilexetil-HCTZ 32-25 MG TABS, Take 1 tablet by mouth daily., Disp: 90 tablet, Rfl: 1 .  Cholecalciferol (VITAMIN D PO), Take 1,000 Units by mouth daily., Disp: , Rfl:  .  diclofenac Sodium (VOLTAREN) 1 %  GEL, APPLY 2 GRAMS TOPICALLY TO AFFECTED AREA FOUR TIMES DAILY, Disp: 100 g, Rfl: 2 .  febuxostat (ULORIC) 40 MG tablet, Take 1 tablet (40 mg total) by mouth daily., Disp: 90 tablet, Rfl: 1 .  metFORMIN (GLUCOPHAGE-XR) 500 MG 24 hr tablet, Take 1 tablet (500 mg total) by mouth every evening., Disp: 90 tablet, Rfl: 1 .  rosuvastatin (CRESTOR) 10 MG tablet, Take 1 tablet (10 mg total) by mouth daily. (Patient not taking: Reported on 05/04/2021), Disp: 90 tablet, Rfl: 1  No Known Allergies  I personally reviewed active problem list, medication list, allergies, family history, social history, health maintenance with the patient/caregiver today.   ROS  Constitutional: Negative for fever or weight change.  Respiratory: Negative for cough  and shortness of breath.   Cardiovascular: Negative for chest pain or palpitations.  Gastrointestinal: Negative for abdominal pain, no bowel changes.  Musculoskeletal: Negative for gait problem or joint swelling.  Skin: Negative for rash.  Neurological: Negative for dizziness or headache.  No other specific complaints in a complete review of systems (except as listed in HPI above).  Objective  Vitals:   05/04/21 0731  BP: 130/80  Pulse: 94  Resp: 16  Temp: 98.8 F (37.1 C)  TempSrc: Oral  SpO2: 98%  Weight: 205 lb (93 kg)  Height: 5\' 7"  (1.702 m)    Body mass index is 32.11 kg/m.  Physical Exam  Constitutional: Patient appears well-developed and well-nourished. Obese No distress.  HEENT: head atraumatic, normocephalic, pupils equal and reactive to light,  neck supple Cardiovascular: Normal rate, regular rhythm and normal heart sounds.  No murmur heard. No BLE edema. Pulmonary/Chest: Effort normal and breath sounds normal. No respiratory distress. Abdominal: Soft.  There is no tenderness. Skin: multiple on face and neck Muscular skeletal: slow getting up on the table, discussed physical activity  Psychiatric: Patient has a normal mood and affect. behavior is normal. Judgment and thought content normal.  PHQ2/9: Depression screen [facility] 2/9 05/04/2021 11/04/2020 05/04/2020 10/15/2019 07/15/2019  Decreased Interest 0 0 0 0 0  Down, Depressed, Hopeless 0 0 0 0 0  PHQ - 2 Score 0 0 0 0 0  Altered sleeping - 1 0 0 0  Tired, decreased energy - 0 0 0 0  Change in appetite - 0 0 0 0  Feeling bad or failure about yourself  - 0 0 0 0  Trouble concentrating - 0 0 0 0  Moving slowly or fidgety/restless - 0 0 0 0  Suicidal thoughts - 0 0 0 0  PHQ-9 Score - 1 0 0 0  Difficult doing work/chores - Not difficult at all - Not difficult at all Not difficult at all    phq 9 is negative   Fall Risk: Fall Risk  05/04/2021 11/04/2020 05/04/2020 10/15/2019 07/15/2019  Falls in the past year? 0  0 0 0 0  Number falls in past yr: 0 0 0 0 0  Injury with Fall? 0 0 0 0 0  Risk for fall due to : - - - - -  Follow up - - - Falls evaluation completed -     Functional Status Survey: Is the patient deaf or have difficulty hearing?: No Does the patient have difficulty seeing, even when wearing glasses/contacts?: No Does the patient have difficulty concentrating, remembering, or making decisions?: No Does the patient have difficulty walking or climbing stairs?: No Does the patient have difficulty dressing or bathing?: No Does the patient have difficulty doing errands alone  such as visiting a doctor's office or shopping?: No    Assessment & Plan  1. Stage 3a chronic kidney disease ([place])   2. Dyslipidemia  - rosuvastatin (CRESTOR) 10 MG tablet; Take 1 tablet (10 mg total) by mouth 3 (three) times a week.  Dispense: 48 tablet; Refill: 0  3. Colon cancer screening  - Cologuard  4. Need for shingles vaccine  - Varicella-zoster vaccine IM (Shingrix)  5. Prediabetes  - metFORMIN (GLUCOPHAGE-XR) 500 MG 24 hr tablet; Take 1 tablet (500 mg total) by mouth every evening.  Dispense: 90 tablet; Refill: 1  6. Low vitamin B12 level   7. Controlled gout  - febuxostat (ULORIC) 40 MG tablet; Take 1 tablet (40 mg total) by mouth daily.  Dispense: 90 tablet; Refill: 1  8. Vitamin D deficiency   9. Essential hypertension  - Candesartan Cilexetil-HCTZ 32-25 MG TABS; Take 1 tablet by mouth daily.  Dispense: 90 tablet; Refill: 1  10. Hyperglycemia  - POCT HgB A1C  11. Skin tags, multiple acquired  - Ambulatory referral to Dermatology

## 2021-05-04 ENCOUNTER — Other Ambulatory Visit: Payer: Self-pay

## 2021-05-04 ENCOUNTER — Ambulatory Visit: Payer: Managed Care, Other (non HMO) | Admitting: Family Medicine

## 2021-05-04 ENCOUNTER — Encounter: Payer: Self-pay | Admitting: Family Medicine

## 2021-05-04 VITALS — BP 130/80 | HR 94 | Temp 98.8°F | Resp 16 | Ht 67.0 in | Wt 205.0 lb

## 2021-05-04 DIAGNOSIS — I10 Essential (primary) hypertension: Secondary | ICD-10-CM

## 2021-05-04 DIAGNOSIS — Z23 Encounter for immunization: Secondary | ICD-10-CM

## 2021-05-04 DIAGNOSIS — N1831 Chronic kidney disease, stage 3a: Secondary | ICD-10-CM | POA: Diagnosis not present

## 2021-05-04 DIAGNOSIS — R7303 Prediabetes: Secondary | ICD-10-CM

## 2021-05-04 DIAGNOSIS — Z1211 Encounter for screening for malignant neoplasm of colon: Secondary | ICD-10-CM

## 2021-05-04 DIAGNOSIS — M109 Gout, unspecified: Secondary | ICD-10-CM

## 2021-05-04 DIAGNOSIS — R739 Hyperglycemia, unspecified: Secondary | ICD-10-CM | POA: Diagnosis not present

## 2021-05-04 DIAGNOSIS — E785 Hyperlipidemia, unspecified: Secondary | ICD-10-CM | POA: Diagnosis not present

## 2021-05-04 DIAGNOSIS — E538 Deficiency of other specified B group vitamins: Secondary | ICD-10-CM

## 2021-05-04 DIAGNOSIS — E559 Vitamin D deficiency, unspecified: Secondary | ICD-10-CM

## 2021-05-04 DIAGNOSIS — R7989 Other specified abnormal findings of blood chemistry: Secondary | ICD-10-CM

## 2021-05-04 DIAGNOSIS — L918 Other hypertrophic disorders of the skin: Secondary | ICD-10-CM

## 2021-05-04 LAB — POCT GLYCOSYLATED HEMOGLOBIN (HGB A1C): Hemoglobin A1C: 6.1 % — AB (ref 4.0–5.6)

## 2021-05-04 MED ORDER — CANDESARTAN CILEXETIL-HCTZ 32-25 MG PO TABS: 1.0000 | ORAL_TABLET | Freq: Every day | ORAL | 1 refills | Status: DC

## 2021-05-04 MED ORDER — ROSUVASTATIN CALCIUM 10 MG PO TABS: 10.0000 mg | ORAL_TABLET | ORAL | 0 refills | Status: DC

## 2021-05-04 MED ORDER — CANDESARTAN CILEXETIL-HCTZ 32-25 MG PO TABS
1.0000 | ORAL_TABLET | Freq: Every day | ORAL | 1 refills | Status: DC
Start: 2021-05-04 — End: 2021-11-07

## 2021-05-04 MED ORDER — METFORMIN HCL ER 500 MG PO TB24: 500.0000 mg | ORAL_TABLET | Freq: Every evening | ORAL | 1 refills | Status: DC

## 2021-05-04 MED ORDER — FEBUXOSTAT 40 MG PO TABS: 40.0000 mg | ORAL_TABLET | Freq: Every day | ORAL | 1 refills | Status: DC

## 2021-09-27 ENCOUNTER — Other Ambulatory Visit: Payer: Self-pay

## 2021-09-27 ENCOUNTER — Ambulatory Visit: Payer: Managed Care, Other (non HMO) | Admitting: Dermatology

## 2021-09-27 ENCOUNTER — Encounter: Payer: Self-pay | Admitting: Dermatology

## 2021-09-27 DIAGNOSIS — L821 Other seborrheic keratosis: Secondary | ICD-10-CM

## 2021-09-27 DIAGNOSIS — L820 Inflamed seborrheic keratosis: Secondary | ICD-10-CM | POA: Diagnosis not present

## 2021-09-27 NOTE — Patient Instructions (Signed)

## 2021-09-27 NOTE — Progress Notes (Signed)
   New Patient Visit  Subjective  Fernando Hart is a 70 y.o. male who presents for the following: irritated skin lesions (On the face and neck - makes it difficult to shave ).  The following portions of the chart were reviewed this encounter and updated as appropriate:   Tobacco  Allergies  Meds  Problems  Med Hx  Surg Hx  Fam Hx     Review of Systems:  No other skin or systemic complaints except as noted in HPI or Assessment and Plan.  Objective  Well appearing patient in no apparent distress; mood and affect are within normal limits.  A focused examination was performed including the face and neck. Relevant physical exam findings are noted in the Assessment and Plan.  Face and neck (15) Erythematous keratotic or waxy stuck-on papule or plaque.    Assessment & Plan  Inflamed seborrheic keratosis -traumatized.  Patient would like treatment. Face and neck  Discussed snip excision to pedunculated lesions at follow up appointment.   ED performed today of the R face x 15 Discussed risk of dyspigmentation for additional treatment.  Destruction of lesion - Face and neck Complexity: simple   Destruction method comment:  Electrodesiccation Informed consent: discussed and consent obtained   Hemostasis achieved with:  electrodesiccation Outcome: patient tolerated procedure well with no complications   Post-procedure details: wound care instructions given    Seborrheic Keratoses - Stuck-on, waxy, tan-brown papules and/or plaques  - Benign-appearing - Discussed benign etiology and prognosis. - Observe - Call for any changes  Return in about 3 months (around 12/28/2021) for ISK treatment - ED and snip excision.  Luther Redo, CMA, am acting as scribe for Sarina Ser, MD . Documentation: I have reviewed the above documentation for accuracy and completeness, and I agree with the above.  Sarina Ser, MD

## 2021-11-06 NOTE — Progress Notes (Signed)
Name: Fernando Hart   MRN: [id number]    DOB: September 18, 1951   Date:11/07/2021       Progress Note  Subjective  Chief Complaint  Annual Exam  HPI  Patient presents for annual CPE and follow up  HTN: he is compliant with bp medication, he denies chest pain , palpitation or dizziness. BP is at goal today, however he has noticed more frequent gout attacks and we will change bp medication to eliminate HCTZ. Going to start Exforge 5/160 daily    CKI stage III: discussed importance of cutting down on NSAID's and taking Tylenol instead of pain. He denies pruritis, good urine output  His last GFR was okay we will recheck labs today    Hyperlipidemia: he used to take Crestor occasionally but once he started to take it daily a couple of months ago he developed severe muscle pain and stopped crestor, but now he has been off medication and still has body aches so he does not think secondary to crestor and will try to resume it again    Hyperglycemia: He denies polyphagia, polydipsia or polyuria.He stopped Metformin because it made him feel fatigued, we will recheck labs today    Gout: he stopped Uloric on his own months ago and has been taking prn for gout, explained better to stop than take it prn. Try colchicine prn only   IPSS Questionnaire (AUA-7): Over the past month.   1)  How often have you had a sensation of not emptying your bladder completely after you finish urinating?  0 - Not at all  2)  How often have you had to urinate again less than two hours after you finished urinating? 0 - Not at all  3)  How often have you found you stopped and started again several times when you urinated?  0 - Not at all  4) How difficult have you found it to postpone urination?  0 - Not at all  5) How often have you had a weak urinary stream?  0 - Not at all  6) How often have you had to push or strain to begin urination?  0 - Not at all  7) How many times did you most typically get up to urinate from the time  you went to bed until the time you got up in the morning?  0 - None  Total score:  0-7 mildly symptomatic   8-19 moderately symptomatic   20-35 severely symptomatic     Diet: discussed a balanced diet  Exercise: discussed 150 minutes per week - he just bought a stationary bike   Depression: phq 9 is negative Depression screen [facility] 2/9 11/07/2021 05/04/2021 11/04/2020 05/04/2020 10/15/2019  Decreased Interest 0 0 0 0 0  Down, Depressed, Hopeless 0 0 0 0 0  PHQ - 2 Score 0 0 0 0 0  Altered sleeping 0 - 1 0 0  Tired, decreased energy 0 - 0 0 0  Change in appetite 0 - 0 0 0  Feeling bad or failure about yourself  0 - 0 0 0  Trouble concentrating 0 - 0 0 0  Moving slowly or fidgety/restless 0 - 0 0 0  Suicidal thoughts 0 - 0 0 0  PHQ-9 Score 0 - 1 0 0  Difficult doing work/chores - - Not difficult at all - Not difficult at all  Some recent data might be hidden    Hypertension:  BP Readings from Last 3 Encounters:  11/07/21 132/84  05/04/21  130/80  11/04/20 128/72    Obesity: Wt Readings from Last 3 Encounters:  11/07/21 202 lb (91.6 kg)  05/04/21 205 lb (93 kg)  11/04/20 204 lb 1.6 oz (92.6 kg)   BMI Readings from Last 3 Encounters:  11/07/21 31.64 kg/m  05/04/21 32.11 kg/m  11/04/20 31.97 kg/m     Lipids:  Lab Results  Component Value Date   CHOL 156 10/13/2020   CHOL 146 07/24/2019   CHOL 173 07/31/2018   Lab Results  Component Value Date   HDL 34 (L) 10/13/2020   HDL 33 (L) 07/24/2019   HDL 34 (A) 07/31/2018   Lab Results  Component Value Date   LDLCALC 98 10/13/2020   LDLCALC 95 07/24/2019   LDLCALC 114 07/31/2018   Lab Results  Component Value Date   TRIG 135 10/13/2020   TRIG 88 07/24/2019   TRIG 123 07/31/2018   Lab Results  Component Value Date   CHOLHDL 4.6 10/13/2020   CHOLHDL 4.4 07/24/2019   CHOLHDL 4.5 05/09/2018   No results found for: LDLDIRECT Glucose:  Glucose  Date Value Ref Range Status  11/29/2020 90 65 - 99 mg/dL Final   10/13/2020 100 (H) 65 - 99 mg/dL Final  07/24/2019 98 65 - 99 mg/dL Final    Flowsheet Row Office Visit from 11/07/2021 in [facility]  AUDIT-C Score 1      Divorced STD testing and prevention (HIV/chl/gon/syphilis): N/A Hep C: 03/13/17  Skin cancer: Discussed monitoring for atypical lesions Colorectal cancer: 04/16/17 Prostate cancer: not indicated    Lung cancer: Low Dose CT Chest recommended if Age 62-80 years, 30 pack-year currently smoking OR have quit w/in 15years. Patient does not qualify.   AAA: Tup to date  ECG:  05/04/20  Vaccines:   Shingrix: up to date  Pneumonia: up to date  Flu: today COVID: up to date   Advanced Care Planning: A voluntary discussion about advance care planning including the explanation and discussion of advance directives.  Discussed health care proxy and Living will, and the patient was able to identify a health care proxy as Lawana Chambers   Patient Active Problem List   Diagnosis Date Noted   Stage 3a chronic kidney disease ([place]) 11/04/2020   Vitamin D deficiency 11/04/2020   Intermittent low back pain 03/24/2018   Hyperlipidemia 12/23/2017   Tinnitus of right ear 07/12/2017   Dyslipidemia 10/11/2016   Obesity 10/11/2016   Degenerative joint disease of right hip 02/28/2016   Prediabetes 01/10/2016   Controlled gout 01/10/2016   Essential hypertension 01/10/2016    Past Surgical History:  Procedure Laterality Date   NO PAST SURGERIES     ROOT CANAL      Family History  Problem Relation Age of Onset   Hypertension Mother    Diabetes Father    Diabetes Sister    Sudden death Daughter     Social History   Socioeconomic History   Marital status: Divorced    Spouse name: Not on file   Number of children: 3   Years of education: Not on file   Highest education level: Associate degree: occupational, Hotel manager, or vocational program  Occupational History   Occupation: IT   Tobacco Use   Smoking status:  Former    Packs/day: 1.00    Years: 35.00    Pack years: 35.00    Types: Cigarettes    Quit date: 07/2004    Years since quitting: 17.3   Smokeless tobacco: Never  Vaping Use   Vaping Use: Never used  Substance and Sexual Activity   Alcohol use: Yes    Alcohol/week: 1.0 standard drink    Types: 1 Shots of liquor per week    Comment: not every week   Drug use: Yes   Sexual activity: Yes    Partners: Female    Comment: partner is post-menopausal  Other Topics Concern   Not on file  Social History Narrative   Divorced many years ago, dating same person for 25 years she lives in [place] but they thinking about getting married   He had  3 grown children, youngest daughter died suddenly at age 77 from unknown at her gym .    8 grandchildren.    Two daughters living in [place]   Social Determinants of Health   Financial Resource Strain: Low Risk    Difficulty of Paying Living Expenses: Not hard at all  Food Insecurity: No Food Insecurity   Worried About Charity fundraiser in the Last Year: Never true   Arboriculturist in the Last Year: Never true  Transportation Needs: No Transportation Needs   Lack of Transportation (Medical): No   Lack of Transportation (Non-Medical): No  Physical Activity: Insufficiently Active   Days of Exercise per Week: 5 days   Minutes of Exercise per Session: 10 min  Stress: No Stress Concern Present   Feeling of Stress : Only a little  Social Connections: Socially Isolated   Frequency of Communication with Friends and Family: More than three times a week   Frequency of Social Gatherings with Friends and Family: Once a week   Attends Religious Services: Never   Marine scientist or Organizations: No   Attends Music therapist: Never   Marital Status: Divorced  Human resources officer Violence: Not At Risk   Fear of Current or Ex-Partner: No   Emotionally Abused: No   Physically Abused: No   Sexually Abused: No     Current Outpatient  Medications:    amLODipine-valsartan (EXFORGE) 5-160 MG tablet, Take 1 tablet by mouth daily., Disp: 90 tablet, Rfl: 1   aspirin EC 81 MG tablet, Take 1 tablet (81 mg total) by mouth daily., Disp: 30 tablet, Rfl: 0   Cholecalciferol (VITAMIN D PO), Take 1,000 Units by mouth daily., Disp: , Rfl:    colchicine 0.6 MG tablet, Take 1 tablet (0.6 mg total) by mouth daily. May repeat in 1 hour if no improvement no more than 2 in 24 hours, Disp: 30 tablet, Rfl: 0   diclofenac Sodium (VOLTAREN) 1 % GEL, APPLY 2 GRAMS TOPICALLY TO AFFECTED AREA FOUR TIMES DAILY, Disp: 100 g, Rfl: 2   [START ON 11/08/2021] rosuvastatin (CRESTOR) 10 MG tablet, Take 1 tablet (10 mg total) by mouth 3 (three) times a week., Disp: 90 tablet, Rfl: 1  No Known Allergies   ROS  Constitutional: Negative for fever or weight change.  Respiratory: Negative for cough and shortness of breath.   Cardiovascular: Negative for chest pain or palpitations.  Gastrointestinal: Negative for abdominal pain, no bowel changes.  Musculoskeletal: Negative for gait problem but he has intermittent  joint swelling.  Skin: Negative for rash.  Neurological: Negative for dizziness or headache.  No other specific complaints in a complete review of systems (except as listed in HPI above).    Objective  Vitals:   11/07/21 0731  BP: 132/84  Pulse: 86  Resp: 16  Temp: 98.2 F (36.8 C)  SpO2:  94%  Weight: 202 lb (91.6 kg)  Height: 5\' 7"  (1.702 m)    Body mass index is 31.64 kg/m.  Physical Exam  Constitutional: Patient appears well-developed and well-nourished. No distress.  HENT: Head: Normocephalic and atraumatic. Ears: B TMs ok, no erythema or effusion; Nose: Not done . Mouth/Throat: not done  Eyes: Conjunctivae and EOM are normal. Pupils are equal, round, and reactive to light. No scleral icterus.  Neck: Normal range of motion. Neck supple. No JVD present. No thyromegaly present.  Cardiovascular: Normal rate, regular rhythm and  normal heart sounds.  No murmur heard. No BLE edema. Pulmonary/Chest: Effort normal and breath sounds normal. No respiratory distress. Abdominal: Soft. Bowel sounds are normal, no distension. There is no tenderness. no masses MALE GENITALIA: Normal descended testes bilaterally, no masses palpated, no hernias, no lesions, no discharge RECTAL: not done  Musculoskeletal: Normal range of motion, no joint effusions. No gross deformities Neurological: he is alert and oriented to person, place, and time. No cranial nerve deficit. Coordination, balance, strength, speech and gait are normal.  Skin: Skin is warm and dry. No rash noted. No erythema.  Psychiatric: Patient has a normal mood and affect. behavior is normal. Judgment and thought content normal.    Fall Risk: Fall Risk  11/07/2021 05/04/2021 11/04/2020 05/04/2020 10/15/2019  Falls in the past year? 0 0 0 0 0  Number falls in past yr: 0 0 0 0 0  Injury with Fall? 0 0 0 0 0  Risk for fall due to : No Fall Risks - - - -  Follow up Falls prevention discussed - - - Falls evaluation completed     Functional Status Survey: Is the patient deaf or have difficulty hearing?: No Does the patient have difficulty seeing, even when wearing glasses/contacts?: No Does the patient have difficulty concentrating, remembering, or making decisions?: No Does the patient have difficulty walking or climbing stairs?: Yes Does the patient have difficulty dressing or bathing?: No Does the patient have difficulty doing errands alone such as visiting a doctor's office or shopping?: No    Assessment & Plan   1. Well adult exam   2. Need for immunization against influenza  - Flu Vaccine QUAD High Dose(Fluad)  3. Colon cancer screening  - Ambulatory referral to Gastroenterology  4. Vitamin D deficiency  - VITAMIN D 25 Hydroxy (Vit-D Deficiency, Fractures)  5. Controlled gout  - Uric acid - colchicine 0.6 MG tablet; Take 1 tablet (0.6 mg total) by  mouth daily. May repeat in 1 hour if no improvement no more than 2 in 24 hours  Dispense: 30 tablet; Refill: 0  6. Low vitamin B12 level  - Vitamin B12  7. Dyslipidemia  - Lipid panel - rosuvastatin (CRESTOR) 10 MG tablet; Take 1 tablet (10 mg total) by mouth 3 (three) times a week.  Dispense: 90 tablet; Refill: 1  8. Stage 3a chronic kidney disease ([facility])  - COMPLETE METABOLIC PANEL WITH GFR - Microalbumin / creatinine urine ratio  9. Essential hypertension  - CBC with Differential/Platelet - COMPLETE METABOLIC PANEL WITH GFR - amLODipine-valsartan (EXFORGE) 5-160 MG tablet; Take 1 tablet by mouth daily.  Dispense: 90 tablet; Refill: 1  10. Hyperglycemia  - Hemoglobin A1c  11. Primary osteoarthritis of both knees  - diclofenac Sodium (VOLTAREN) 1 % GEL; APPLY 2 GRAMS TOPICALLY TO AFFECTED AREA FOUR TIMES DAILY  Dispense: 100 g; Refill: 2    -Prostate cancer screening and PSA options (with potential risks and benefits of  testing vs not testing) were discussed along with recent recs/guidelines. -USPSTF grade A and B recommendations reviewed with patient; age-appropriate recommendations, preventive care, screening tests, etc discussed and encouraged; healthy living encouraged; see AVS for patient education given to patient -Discussed importance of 150 minutes of physical activity weekly, eat two servings of fish weekly, eat one serving of tree nuts ( cashews, pistachios, pecans, almonds.Marland Kitchen) every other day, eat 6 servings of fruit/vegetables daily and drink plenty of water and avoid sweet beverages.

## 2021-11-07 ENCOUNTER — Other Ambulatory Visit: Payer: Self-pay | Admitting: Family Medicine

## 2021-11-07 ENCOUNTER — Encounter: Payer: Self-pay | Admitting: Family Medicine

## 2021-11-07 ENCOUNTER — Other Ambulatory Visit: Payer: Self-pay

## 2021-11-07 ENCOUNTER — Ambulatory Visit (INDEPENDENT_AMBULATORY_CARE_PROVIDER_SITE_OTHER): Payer: Managed Care, Other (non HMO) | Admitting: Family Medicine

## 2021-11-07 VITALS — BP 132/84 | HR 86 | Temp 98.2°F | Resp 16 | Ht 67.0 in | Wt 202.0 lb

## 2021-11-07 DIAGNOSIS — Z0000 Encounter for general adult medical examination without abnormal findings: Secondary | ICD-10-CM

## 2021-11-07 DIAGNOSIS — Z1211 Encounter for screening for malignant neoplasm of colon: Secondary | ICD-10-CM | POA: Diagnosis not present

## 2021-11-07 DIAGNOSIS — N1831 Chronic kidney disease, stage 3a: Secondary | ICD-10-CM

## 2021-11-07 DIAGNOSIS — Z23 Encounter for immunization: Secondary | ICD-10-CM

## 2021-11-07 DIAGNOSIS — M109 Gout, unspecified: Secondary | ICD-10-CM

## 2021-11-07 DIAGNOSIS — E538 Deficiency of other specified B group vitamins: Secondary | ICD-10-CM | POA: Diagnosis not present

## 2021-11-07 DIAGNOSIS — M170 Bilateral primary osteoarthritis of knee: Secondary | ICD-10-CM

## 2021-11-07 DIAGNOSIS — E559 Vitamin D deficiency, unspecified: Secondary | ICD-10-CM | POA: Diagnosis not present

## 2021-11-07 DIAGNOSIS — R739 Hyperglycemia, unspecified: Secondary | ICD-10-CM

## 2021-11-07 DIAGNOSIS — E785 Hyperlipidemia, unspecified: Secondary | ICD-10-CM

## 2021-11-07 DIAGNOSIS — I10 Essential (primary) hypertension: Secondary | ICD-10-CM

## 2021-11-07 MED ORDER — COLCHICINE 0.6 MG PO TABS: 0.6000 mg | ORAL_TABLET | Freq: Every day | ORAL | 0 refills | Status: DC

## 2021-11-07 MED ORDER — DICLOFENAC SODIUM 1 % EX GEL
CUTANEOUS | 2 refills | Status: DC
Start: 2021-11-07 — End: 2022-08-08

## 2021-11-07 MED ORDER — AMLODIPINE BESYLATE-VALSARTAN 5-160 MG PO TABS: 1.0000 | ORAL_TABLET | Freq: Every day | ORAL | 1 refills | Status: DC

## 2021-11-07 MED ORDER — ROSUVASTATIN CALCIUM 10 MG PO TABS
10.0000 mg | ORAL_TABLET | ORAL | 1 refills | Status: DC
Start: 2021-11-08 — End: 2022-08-08

## 2021-11-13 ENCOUNTER — Telehealth: Payer: Self-pay

## 2021-11-13 NOTE — Telephone Encounter (Signed)
CALLED PATIENT NO ANSWER LEFT VOICEMAIL FOR A CALL BACK ? ?

## 2022-01-01 ENCOUNTER — Other Ambulatory Visit: Payer: Self-pay

## 2022-01-01 ENCOUNTER — Ambulatory Visit: Payer: Managed Care, Other (non HMO) | Admitting: Dermatology

## 2022-01-01 DIAGNOSIS — L821 Other seborrheic keratosis: Secondary | ICD-10-CM

## 2022-01-01 DIAGNOSIS — L578 Other skin changes due to chronic exposure to nonionizing radiation: Secondary | ICD-10-CM

## 2022-01-01 DIAGNOSIS — D492 Neoplasm of unspecified behavior of bone, soft tissue, and skin: Secondary | ICD-10-CM

## 2022-01-01 DIAGNOSIS — D485 Neoplasm of uncertain behavior of skin: Secondary | ICD-10-CM

## 2022-01-01 DIAGNOSIS — L820 Inflamed seborrheic keratosis: Secondary | ICD-10-CM

## 2022-01-01 NOTE — Progress Notes (Signed)
° °  Follow-Up Visit   Subjective  Fernando Hart is a 71 y.o. male who presents for the following: Follow-up (3 months f/u skin tags on his face and neck ). The patient has spots, moles and lesions to be evaluated, some may be new or changing and the patient has concerns that these could be cancer.   The following portions of the chart were reviewed this encounter and updated as appropriate:   Tobacco   Allergies   Meds   Problems   Med Hx   Surg Hx   Fam Hx      Review of Systems:  No other skin or systemic complaints except as noted in HPI or Assessment and Plan.  Objective  Well appearing patient in no apparent distress; mood and affect are within normal limits.  A focused examination was performed including face. Relevant physical exam findings are noted in the Assessment and Plan.  right cheek x 5, left cheek x 2  (7) (7) Pedunculated Stuck-on, waxy, tan-brown papules  right anterior neck 0.6 cm brown papule    Assessment & Plan  Inflamed seborrheic keratosis (7) right cheek x 5, left cheek x 2  (7)  Reassured benign age-related growth.  Recommend observation.  Discussed cryotherapy if spot(s) become irritated or inflamed.   Destruction of lesion - right cheek x 5, left cheek x 2  (7) Complexity: simple   Destruction method: cryotherapy   Informed consent: discussed and consent obtained   Timeout:  patient name, date of birth, surgical site, and procedure verified Lesion destroyed using liquid nitrogen: Yes   Region frozen until ice ball extended beyond lesion: Yes   Outcome: patient tolerated procedure well with no complications   Post-procedure details: wound care instructions given    Neoplasm of skin right anterior neck  Epidermal / dermal shaving  Lesion diameter (cm):  0.6 Informed consent: discussed and consent obtained   Timeout: patient name, date of birth, surgical site, and procedure verified   Procedure prep:  Patient was prepped and draped in usual  sterile fashion Prep type:  Isopropyl alcohol Anesthesia: the lesion was anesthetized in a standard fashion   Anesthetic:  1% lidocaine w/ epinephrine 1-100,000 buffered w/ 8.4% NaHCO3 Hemostasis achieved with: pressure, aluminum chloride and electrodesiccation   Outcome: patient tolerated procedure well   Post-procedure details: sterile dressing applied and wound care instructions given   Dressing type: bandage and petrolatum    Related Procedures Anatomic Pathology Report  Seborrheic Keratoses - Stuck-on, waxy, tan-brown papules and/or plaques  - Benign-appearing - Discussed benign etiology and prognosis. - Observe - Call for any changes  Actinic Damage - chronic, secondary to cumulative UV radiation exposure/sun exposure over time - diffuse scaly erythematous macules with underlying dyspigmentation - Recommend daily broad spectrum sunscreen SPF 30+ to sun-exposed areas, reapply every 2 hours as needed.  - Recommend staying in the shade or wearing long sleeves, sun glasses (UVA+UVB protection) and wide brim hats (4-inch brim around the entire circumference of the hat). - Call for new or changing lesions.  Return in about 3 months (around 04/01/2022) for ISK .  I, Marye Round, CMA, am acting as scribe for Sarina Ser, MD .  Documentation: I have reviewed the above documentation for accuracy and completeness, and I agree with the above.  Sarina Ser, MD

## 2022-01-01 NOTE — Patient Instructions (Addendum)
Wound Care Instructions  Cleanse wound gently with soap and water once a day then pat dry with clean gauze. Apply a thing coat of Petrolatum (petroleum jelly, "Vaseline") over the wound (unless you have an allergy to this). We recommend that you use a new, sterile tube of Vaseline. Do not pick or remove scabs. Do not remove the yellow or white "healing tissue" from the base of the wound.  Cover the wound with fresh, clean, nonstick gauze and secure with paper tape. You may use Band-Aids in place of gauze and tape if the would is small enough, but would recommend trimming much of the tape off as there is often too much. Sometimes Band-Aids can irritate the skin.  You should call the office for your biopsy report after 1 week if you have not already been contacted.  If you experience any problems, such as abnormal amounts of bleeding, swelling, significant bruising, significant pain, or evidence of infection, please call the office immediately.  FOR ADULT SURGERY PATIENTS: If you need something for pain relief you may take 1 extra strength Tylenol (acetaminophen) AND 2 Ibuprofen (200mg  each) together every 4 hours as needed for pain. (do not take these if you are allergic to them or if you have a reason you should not take them.) Typically, you may only need pain medication for 1 to 3 days.       If You Need Anything After Your Visit  If you have any questions or concerns for your doctor, please call our main line at [phone] and press option 4 to reach your doctor's medical assistant. If no one answers, please leave a voicemail as directed and we will return your call as soon as possible. Messages left after 4 pm will be answered the following business day.   You may also send [place] a message via [place]. We typically respond to MyChart messages within 1-2 business days.  For prescription refills, please ask your pharmacy to contact our office. Our fax number is [phone].  If you have an  urgent issue when the clinic is closed that cannot wait until the next business day, you can page your doctor at the number below.    Please note that while we do our best to be available for urgent issues outside of office hours, we are not available 24/7.   If you have an urgent issue and are unable to reach [place], you may choose to seek medical care at your doctor's office, retail clinic, urgent care center, or emergency room.  If you have a medical emergency, please immediately call 911 or go to the emergency department.  Pager Numbers  - Dr. Nehemiah Massed: [phone]  - Dr. Laurence Ferrari: [phone]  - Dr. Nicole Kindred: [phone]  In the event of inclement weather, please call our main line at [phone] for an update on the status of any delays or closures.  Dermatology Medication Tips: Please keep the boxes that topical medications come in in order to help keep track of the instructions about where and how to use these. Pharmacies typically print the medication instructions only on the boxes and not directly on the medication tubes.   If your medication is too expensive, please contact our office at [phone] option 4 or send [place] a message through [place].   We are unable to tell what your co-pay for medications will be in advance as this is different depending on your insurance coverage. However, we may be able to find a substitute medication at lower  cost or fill out paperwork to get insurance to cover a needed medication.   If a prior authorization is required to get your medication covered by your insurance company, please allow [place] 1-2 business days to complete this process.  Drug prices often vary depending on where the prescription is filled and some pharmacies may offer cheaper prices.  The website www.goodrx.com contains coupons for medications through different pharmacies. The prices here do not account for what the cost may be with help from insurance (it may be cheaper with your  insurance), but the website can give you the price if you did not use any insurance.  - You can print the associated coupon and take it with your prescription to the pharmacy.  - You may also stop by our office during regular business hours and pick up a GoodRx coupon card.  - If you need your prescription sent electronically to a different pharmacy, notify our office through [facility] or by phone at [phone] option 4.     Si Usted Necesita Algo Despus de Su Visita  Tambin puede enviarnos un mensaje a travs de Pharmacist, community. Por lo general respondemos a los mensajes de MyChart en el transcurso de 1 a 2 das hbiles.  Para renovar recetas, por favor pida a su farmacia que se ponga en contacto con nuestra oficina. Harland Dingwall de fax es [place] [phone].  Si tiene un asunto urgente cuando la clnica est cerrada y que no puede esperar hasta el siguiente da hbil, puede llamar/localizar a su doctor(a) al nmero que aparece a continuacin.   Por favor, tenga en cuenta que aunque hacemos todo lo posible para estar disponibles para asuntos urgentes fuera del horario de [place], no estamos disponibles las 24 horas del da, los 7 das de la [place].   Si tiene un problema urgente y no puede comunicarse con nosotros, puede optar por buscar atencin mdica  en el consultorio de su doctor(a), en una clnica privada, en un centro de atencin urgente o en una sala de emergencias.  Si tiene Engineering geologist, por favor llame inmediatamente al 911 o vaya a la sala de emergencias.  Nmeros de bper  - Dr. Nehemiah Massed: [phone]  - Dra. Moye: [phone]  - Dra. Nicole Kindred: [phone]  En caso de inclemencias del [place], por favor llame a Johnsie Kindred principal al [phone] para una actualizacin sobre el [place] de cualquier retraso o cierre.  Consejos para la medicacin en dermatologa: Por favor, guarde las cajas en las que vienen los medicamentos de uso tpico para ayudarle a  seguir las instrucciones sobre dnde y cmo usarlos. Las farmacias generalmente imprimen las instrucciones del medicamento slo en las cajas y no directamente en los tubos del [place].   Si su medicamento es muy caro, por favor, pngase en contacto con Zigmund Daniel llamando al [phone] y presione la opcin 4 o envenos un mensaje a travs de Pharmacist, community.   No podemos decirle cul ser su copago por los medicamentos por adelantado ya que esto es diferente dependiendo de la cobertura de su seguro. Sin embargo, es posible que podamos encontrar un medicamento sustituto a Electrical engineer un formulario para que el seguro cubra el medicamento que se considera necesario.   Si se requiere una autorizacin previa para que su compaa de seguros [place] su medicamento, por favor permtanos de 1 a 2 das hbiles para completar este proceso.  Los precios de los medicamentos varan con frecuencia dependiendo del Environmental consultant de dnde se surte  la receta y alguna farmacias pueden ofrecer precios ms baratos.  El sitio web www.goodrx.com tiene cupones para medicamentos de Airline pilot. Los precios aqu no tienen en cuenta lo que podra costar con la ayuda del seguro (puede ser ms barato con su seguro), pero el sitio web puede darle el precio si no utiliz Research scientist (physical sciences).  - Puede imprimir el cupn correspondiente y llevarlo con su receta a la farmacia.  - Tambin puede pasar por nuestra oficina durante el horario de atencin regular y Charity fundraiser una tarjeta de cupones de GoodRx.  - Si necesita que su receta se enve electrnicamente a una farmacia diferente, informe a nuestra oficina a travs de MyChart de [place] o por telfono llamando al [phone] y presione la opcin 4.     If You Need Anything After Your Visit  If you have any questions or concerns for your doctor, please call our main line at [phone] and press option 4 to reach your doctor's medical assistant. If no one answers, please  leave a voicemail as directed and we will return your call as soon as possible. Messages left after 4 pm will be answered the following business day.   You may also send [place] a message via [employer]. We typically respond to MyChart messages within 1-2 business days.  For prescription refills, please ask your pharmacy to contact our office. Our fax number is [phone].  If you have an urgent issue when the clinic is closed that cannot wait until the next business day, you can page your doctor at the number below.    Please note that while we do our best to be available for urgent issues outside of office hours, we are not available 24/7.   If you have an urgent issue and are unable to reach [place], you may choose to seek medical care at your doctor's office, retail clinic, urgent care center, or emergency room.  If you have a medical emergency, please immediately call 911 or go to the emergency department.  Pager Numbers  - Dr. Nehemiah Massed: [phone]  - Dr. Laurence Ferrari: [phone]  - Dr. Nicole Kindred: [phone]  In the event of inclement weather, please call our main line at [phone] for an update on the status of any delays or closures.  Dermatology Medication Tips: Please keep the boxes that topical medications come in in order to help keep track of the instructions about where and how to use these. Pharmacies typically print the medication instructions only on the boxes and not directly on the medication tubes.   If your medication is too expensive, please contact our office at [phone] option 4 or send [place] a message through [place].   We are unable to tell what your co-pay for medications will be in advance as this is different depending on your insurance coverage. However, we may be able to find a substitute medication at lower cost or fill out paperwork to get insurance to cover a needed medication.   If a prior authorization is required to get your medication covered by your insurance  company, please allow [place] 1-2 business days to complete this process.  Drug prices often vary depending on where the prescription is filled and some pharmacies may offer cheaper prices.  The website www.goodrx.com contains coupons for medications through different pharmacies. The prices here do not account for what the cost may be with help from insurance (it may be cheaper with your insurance), but the website can give you the price if you did  not use any insurance.  - You can print the associated coupon and take it with your prescription to the pharmacy.  - You may also stop by our office during regular business hours and pick up a GoodRx coupon card.  - If you need your prescription sent electronically to a different pharmacy, notify our office through [facility] or by phone at [phone] option 4.     Si Usted Necesita Algo Despus de Su Visita  Tambin puede enviarnos un mensaje a travs de Pharmacist, community. Por lo general respondemos a los mensajes de MyChart en el transcurso de 1 a 2 das hbiles.  Para renovar recetas, por favor pida a su farmacia que se ponga en contacto con nuestra oficina. Harland Dingwall de fax es [place] [phone].  Si tiene un asunto urgente cuando la clnica est cerrada y que no puede esperar hasta el siguiente da hbil, puede llamar/localizar a su doctor(a) al nmero que aparece a continuacin.   Por favor, tenga en cuenta que aunque hacemos todo lo posible para estar disponibles para asuntos urgentes fuera del horario de [place], no estamos disponibles las 24 horas del da, los 7 das de la [place].   Si tiene un problema urgente y no puede comunicarse con nosotros, puede optar por buscar atencin mdica  en el consultorio de su doctor(a), en una clnica privada, en un centro de atencin urgente o en una sala de emergencias.  Si tiene Engineering geologist, por favor llame inmediatamente al 911 o vaya a la sala de emergencias.  Nmeros de bper  - Dr.  Nehemiah Massed: [phone]  - Dra. Moye: [phone]  - Dra. Nicole Kindred: [phone]  En caso de inclemencias del [place], por favor llame a Johnsie Kindred principal al [phone] para una actualizacin sobre el [place] de cualquier retraso o cierre.  Consejos para la medicacin en dermatologa: Por favor, guarde las cajas en las que vienen los medicamentos de uso tpico para ayudarle a seguir las instrucciones sobre dnde y cmo usarlos. Las farmacias generalmente imprimen las instrucciones del medicamento slo en las cajas y no directamente en los tubos del [place].   Si su medicamento es muy caro, por favor, pngase en contacto con Zigmund Daniel llamando al [phone] y presione la opcin 4 o envenos un mensaje a travs de Pharmacist, community.   No podemos decirle cul ser su copago por los medicamentos por adelantado ya que esto es diferente dependiendo de la cobertura de su seguro. Sin embargo, es posible que podamos encontrar un medicamento sustituto a Electrical engineer un formulario para que el seguro cubra el medicamento que se considera necesario.   Si se requiere una autorizacin previa para que su compaa de seguros [place] su medicamento, por favor permtanos de 1 a 2 das hbiles para completar este proceso.  Los precios de los medicamentos varan con frecuencia dependiendo del Environmental consultant de dnde se surte la receta y alguna farmacias pueden ofrecer precios ms baratos.  El sitio web www.goodrx.com tiene cupones para medicamentos de Airline pilot. Los precios aqu no tienen en cuenta lo que podra costar con la ayuda del seguro (puede ser ms barato con su seguro), pero el sitio web puede darle el precio si no utiliz Research scientist (physical sciences).  - Puede imprimir el cupn correspondiente y llevarlo con su receta a la farmacia.  - Tambin puede pasar por nuestra oficina durante el horario de atencin regular y Charity fundraiser una tarjeta de cupones de GoodRx.  - Si necesita que su receta se enve  electrnicamente a [place] diferente, informe a nuestra oficina a travs de MyChart de [place] o por telfono llamando al [phone] y presione la opcin 4.

## 2022-01-02 ENCOUNTER — Encounter: Payer: Self-pay | Admitting: Dermatology

## 2022-01-02 LAB — CBC WITH DIFFERENTIAL/PLATELET
Basophils Absolute: 0 10*3/uL (ref 0.0–0.2)
Basos: 1 %
EOS (ABSOLUTE): 0.4 10*3/uL (ref 0.0–0.4)
Eos: 7 %
Hematocrit: 46.8 % (ref 37.5–51.0)
Hemoglobin: 16 g/dL (ref 13.0–17.7)
Immature Grans (Abs): 0 10*3/uL (ref 0.0–0.1)
Immature Granulocytes: 0 %
Lymphocytes Absolute: 1.7 10*3/uL (ref 0.7–3.1)
Lymphs: 34 %
MCH: 29.1 pg (ref 26.6–33.0)
MCHC: 34.2 g/dL (ref 31.5–35.7)
MCV: 85 fL (ref 79–97)
Monocytes Absolute: 0.7 10*3/uL (ref 0.1–0.9)
Monocytes: 13 %
Neutrophils Absolute: 2.3 10*3/uL (ref 1.4–7.0)
Neutrophils: 45 %
Platelets: 208 10*3/uL (ref 150–450)
RBC: 5.5 x10E6/uL (ref 4.14–5.80)
RDW: 12.3 % (ref 11.6–15.4)
WBC: 5 10*3/uL (ref 3.4–10.8)

## 2022-01-02 LAB — HEMOGLOBIN A1C
Est. average glucose Bld gHb Est-mCnc: 126 mg/dL
Hgb A1c MFr Bld: 6 % — ABNORMAL HIGH (ref 4.8–5.6)

## 2022-01-02 LAB — LIPID PANEL
Chol/HDL Ratio: 4.1 ratio (ref 0.0–5.0)
Cholesterol, Total: 147 mg/dL (ref 100–199)
HDL: 36 mg/dL — ABNORMAL LOW (ref >39)
LDL Chol Calc (NIH): 92 mg/dL (ref 0–99)
Triglycerides: 101 mg/dL (ref 0–149)
VLDL Cholesterol Cal: 19 mg/dL (ref 5–40)

## 2022-01-02 LAB — MICROALBUMIN / CREATININE URINE RATIO
Creatinine, Urine: 133.2 mg/dL
Microalb/Creat Ratio: <2 mg/g creat (ref 0–29)
Microalbumin, Urine: <3 ug/mL

## 2022-01-02 LAB — COMPREHENSIVE METABOLIC PANEL
ALT: 25 IU/L (ref 0–44)
AST: 22 IU/L (ref 0–40)
Albumin/Globulin Ratio: 1.6 (ref 1.2–2.2)
Albumin: 4.5 g/dL (ref 3.7–4.7)
Alkaline Phosphatase: 79 IU/L (ref 44–121)
BUN/Creatinine Ratio: 11 (ref 10–24)
BUN: 13 mg/dL (ref 8–27)
Bilirubin Total: 0.3 mg/dL (ref 0.0–1.2)
CO2: 21 mmol/L (ref 20–29)
Calcium: 10.3 mg/dL — ABNORMAL HIGH (ref 8.6–10.2)
Chloride: 101 mmol/L (ref 96–106)
Creatinine, Ser: 1.18 mg/dL (ref 0.76–1.27)
Globulin, Total: 2.8 g/dL (ref 1.5–4.5)
Glucose: 126 mg/dL — ABNORMAL HIGH (ref 70–99)
Potassium: 3.8 mmol/L (ref 3.5–5.2)
Sodium: 139 mmol/L (ref 134–144)
Total Protein: 7.3 g/dL (ref 6.0–8.5)
eGFR: 66 mL/min/{1.73_m2} (ref >59)

## 2022-01-02 LAB — VITAMIN B12: Vitamin B-12: 512 pg/mL (ref 232–1245)

## 2022-01-02 LAB — URIC ACID: Uric Acid: 6.8 mg/dL (ref 3.8–8.4)

## 2022-01-05 LAB — ANATOMIC PATHOLOGY REPORT

## 2022-01-08 ENCOUNTER — Telehealth: Payer: Self-pay

## 2022-01-08 NOTE — Telephone Encounter (Signed)
-----   Message from Ralene Bathe, MD sent at 01/08/2022 12:46 PM EST ----- Diagnosis synopsis: Comment  Comment: Specimen 1-Skin Biopsy, Shave, Right Anterior Neck:  ACROCHORDON (FIBROEPITHELIAL POLYP), INFLAMED.   Benign inflamed skin tag No further treatment needed

## 2022-01-08 NOTE — Telephone Encounter (Signed)
Left pt message to call for bx results/sh °

## 2022-01-15 ENCOUNTER — Telehealth: Payer: Self-pay

## 2022-01-15 NOTE — Telephone Encounter (Signed)
Advised pt of bx result/sh ?

## 2022-01-15 NOTE — Telephone Encounter (Signed)
-----   Message from Ralene Bathe, MD sent at 01/08/2022 12:46 PM EST ----- Diagnosis synopsis: Comment  Comment: Specimen 1-Skin Biopsy, Shave, Right Anterior Neck:  ACROCHORDON (FIBROEPITHELIAL POLYP), INFLAMED.   Benign inflamed skin tag No further treatment needed

## 2022-01-20 LAB — FECAL OCCULT BLOOD, GUAIAC: Fecal Occult Blood: NEGATIVE

## 2022-03-07 ENCOUNTER — Other Ambulatory Visit: Payer: Self-pay | Admitting: Family Medicine

## 2022-03-07 DIAGNOSIS — M109 Gout, unspecified: Secondary | ICD-10-CM

## 2022-04-03 ENCOUNTER — Ambulatory Visit: Payer: Managed Care, Other (non HMO) | Admitting: Dermatology

## 2022-04-03 DIAGNOSIS — L720 Epidermal cyst: Secondary | ICD-10-CM | POA: Diagnosis not present

## 2022-04-03 DIAGNOSIS — L821 Other seborrheic keratosis: Secondary | ICD-10-CM | POA: Diagnosis not present

## 2022-04-03 DIAGNOSIS — L820 Inflamed seborrheic keratosis: Secondary | ICD-10-CM | POA: Diagnosis not present

## 2022-04-03 NOTE — Patient Instructions (Addendum)

## 2022-04-03 NOTE — Progress Notes (Signed)
? ?  Follow-Up Visit ?  ?Subjective  ?Fernando Hart is a 71 y.o. male who presents for the following: Skin Problem (3 months f/u irritated spots on his neck and face, growing and changing ). ?The patient has spots, moles and lesions to be evaluated, some may be new or changing and the patient has concerns that these could be cancer. ? ?The following portions of the chart were reviewed this encounter and updated as appropriate:  ? Tobacco  Allergies  Meds  Problems  Med Hx  Surg Hx  Fam Hx   ?  ?Review of Systems:  No other skin or systemic complaints except as noted in HPI or Assessment and Plan. ? ?Objective  ?Well appearing patient in no apparent distress; mood and affect are within normal limits. ? ?A focused examination was performed including face,neck. Relevant physical exam findings are noted in the Assessment and Plan. ? ?face x 18 (18) ?Stuck-on, waxy, tan-brown papules  ? ?right cheek ?0.8 cm Subcutaneous nodule.  ? ? ?Assessment & Plan  ?Inflamed seborrheic keratosis (18) ?face x 18 ? ?Reassured benign age-related growth.  Recommend observation.  Discussed cryotherapy if spot(s) become irritated or inflamed.  ? ?We will treat flat ISKs on the face today, return to the office to have snip removal of bigger area on the neck in a few weeks  ? ?Destruction of lesion - face x 18 ?Complexity: simple   ?Destruction method comment:  Electrodesiccation ?Outcome: patient tolerated procedure well with no complications   ?Post-procedure details: wound care instructions given   ? ?Epidermal inclusion cyst ?right cheek ? ?Benign-appearing. Exam most consistent with an epidermal inclusion cyst. Discussed that a cyst is a benign growth that can grow over time and sometimes get irritated or inflamed. Recommend observation if it is not bothersome. Discussed option of surgical excision to remove it if it is growing, symptomatic, or other changes noted. Please call for new or changing lesions so they can be evaluated. ?   ? ?Seborrheic Keratoses ?- Stuck-on, waxy, tan-brown papules and/or plaques  ?- Benign-appearing ?- Discussed benign etiology and prognosis. ?- Observe ?- Call for any changes ? ?Return for removal ISKs on the right neck . ? ?I, Marye Round, CMA, am acting as scribe for Sarina Ser, MD .  ?Documentation: I have reviewed the above documentation for accuracy and completeness, and I agree with the above. ? ?Sarina Ser, MD ? ?

## 2022-04-10 ENCOUNTER — Encounter: Payer: Self-pay | Admitting: Dermatology

## 2022-05-09 ENCOUNTER — Ambulatory Visit: Payer: Managed Care, Other (non HMO) | Admitting: Family Medicine

## 2022-06-18 NOTE — Progress Notes (Deleted)
Name: Fernando Hart   MRN: [id number]    DOB: 12/25/1950   Date:06/18/2022       Progress Note  Subjective  Chief Complaint  Follow Up  HPI  HTN: he is compliant with bp medication, he denies chest pain , palpitation or dizziness. BP is at goal today, however he has noticed more frequent gout attacks and we will change bp medication to eliminate HCTZ. Going to start Exforge 5/160 daily    CKI stage III: discussed importance of cutting down on NSAID's and taking Tylenol instead of pain. He denies pruritis, good urine output  His last GFR was okay we will recheck labs today    Hyperlipidemia: he used to take Crestor occasionally but once he started to take it daily a couple of months ago he developed severe muscle pain and stopped crestor, but now he has been off medication and still has body aches so he does not think secondary to crestor and will try to resume it again    Hyperglycemia: He denies polyphagia, polydipsia or polyuria.He stopped Metformin because it made him feel fatigued, we will recheck labs today    Gout: he stopped Uloric on his own months ago and has been taking prn for gout, explained better to stop than take it prn. Try colchicine prn only  Patient Active Problem List   Diagnosis Date Noted   Stage 3a chronic kidney disease ([place]) 11/04/2020   Vitamin D deficiency 11/04/2020   Intermittent low back pain 03/24/2018   Hyperlipidemia 12/23/2017   Tinnitus of right ear 07/12/2017   Dyslipidemia 10/11/2016   Obesity 10/11/2016   Degenerative joint disease of right hip 02/28/2016   Prediabetes 01/10/2016   Controlled gout 01/10/2016   Essential hypertension 01/10/2016    Past Surgical History:  Procedure Laterality Date   NO PAST SURGERIES     ROOT CANAL      Family History  Problem Relation Age of Onset   Hypertension Mother    Diabetes Father    Diabetes Sister    Sudden death Daughter     Social History   Tobacco Use   Smoking status: Former     Packs/day: 1.00    Years: 35.00    Total pack years: 35.00    Types: Cigarettes    Quit date: 07/2004    Years since quitting: 17.9   Smokeless tobacco: Never  Substance Use Topics   Alcohol use: Yes    Alcohol/week: 1.0 standard drink of alcohol    Types: 1 Shots of liquor per week    Comment: not every week     Current Outpatient Medications:    amLODipine-valsartan (EXFORGE) 5-160 MG tablet, Take 1 tablet by mouth daily., Disp: 90 tablet, Rfl: 1   aspirin EC 81 MG tablet, Take 1 tablet (81 mg total) by mouth daily., Disp: 30 tablet, Rfl: 0   Cholecalciferol (VITAMIN D PO), Take 1,000 Units by mouth daily., Disp: , Rfl:    colchicine 0.6 MG tablet, Take 1 tablet (0.6 mg total) by mouth daily. May repeat in 1 hour if no improvement no more than 2 in 24 hours, Disp: 30 tablet, Rfl: 0   diclofenac Sodium (VOLTAREN) 1 % GEL, APPLY 2 GRAMS TOPICALLY TO AFFECTED AREA FOUR TIMES DAILY, Disp: 100 g, Rfl: 2   rosuvastatin (CRESTOR) 10 MG tablet, Take 1 tablet (10 mg total) by mouth 3 (three) times a week., Disp: 90 tablet, Rfl: 1  No Known Allergies  I personally reviewed active problem  list, medication list, allergies, family history, social history, health maintenance with the patient/caregiver today.   ROS  ***  Objective  There were no vitals filed for this visit.  There is no height or weight on file to calculate BMI.  Physical Exam ***  No results found for this or any previous visit (from the past 2160 hour(s)).   PHQ2/9:    11/07/2021    7:31 AM 05/04/2021    7:31 AM 11/04/2020    7:58 AM 05/04/2020    8:33 AM 10/15/2019    7:43 AM  Depression screen PHQ 2/9  Decreased Interest 0 0 0 0 0  Down, Depressed, Hopeless 0 0 0 0 0  PHQ - 2 Score 0 0 0 0 0  Altered sleeping 0  1 0 0  Tired, decreased energy 0  0 0 0  Change in appetite 0  0 0 0  Feeling bad or failure about yourself  0  0 0 0  Trouble concentrating 0  0 0 0  Moving slowly or fidgety/restless 0  0 0  0  Suicidal thoughts 0  0 0 0  PHQ-9 Score 0  1 0 0  Difficult doing work/chores   Not difficult at all  Not difficult at all    phq 9 is {gen pos [id number]}   Fall Risk:    11/07/2021    7:31 AM 05/04/2021    7:30 AM 11/04/2020    7:58 AM 05/04/2020    8:33 AM 10/15/2019    7:42 AM  Fall Risk   Falls in the past year? 0 0 0 0 0  Number falls in past yr: 0 0 0 0 0  Injury with Fall? 0 0 0 0 0  Risk for fall due to : No Fall Risks      Follow up Falls prevention discussed    Falls evaluation completed      Functional Status Survey:      Assessment & Plan  *** There are no diagnoses linked to this encounter.

## 2022-06-20 ENCOUNTER — Ambulatory Visit: Payer: Managed Care, Other (non HMO) | Admitting: Family Medicine

## 2022-06-20 DIAGNOSIS — Z1211 Encounter for screening for malignant neoplasm of colon: Secondary | ICD-10-CM

## 2022-07-01 ENCOUNTER — Other Ambulatory Visit: Payer: Self-pay | Admitting: Family Medicine

## 2022-07-01 DIAGNOSIS — I10 Essential (primary) hypertension: Secondary | ICD-10-CM

## 2022-07-03 LAB — HEMOGLOBIN A1C: Hemoglobin A1C: 5.7

## 2022-07-03 LAB — LIPID PANEL
Cholesterol: 150 (ref 0–200)
HDL: 33 — AB (ref 35–70)
LDL Cholesterol: 103
Triglycerides: 68 (ref 40–160)

## 2022-07-03 LAB — COMPREHENSIVE METABOLIC PANEL: eGFR: 60

## 2022-07-03 LAB — BASIC METABOLIC PANEL
Creatinine: 1.3 (ref <=1.3)
Glucose: 97

## 2022-07-04 ENCOUNTER — Ambulatory Visit: Payer: Managed Care, Other (non HMO) | Admitting: Dermatology

## 2022-07-04 DIAGNOSIS — L820 Inflamed seborrheic keratosis: Secondary | ICD-10-CM | POA: Diagnosis not present

## 2022-07-04 DIAGNOSIS — L821 Other seborrheic keratosis: Secondary | ICD-10-CM

## 2022-07-04 NOTE — Progress Notes (Signed)
   Follow-Up Visit   Subjective  Fernando Hart is a 72 y.o. male who presents for the following: Irritated skin lesions (On the neck - patient would like removed today). The patient has spots, moles and lesions to be evaluated, some may be new or changing and the patient has concerns that these could be cancer.  The following portions of the chart were reviewed this encounter and updated as appropriate:   Tobacco  Allergies  Meds  Problems  Med Hx  Surg Hx  Fam Hx     Review of Systems:  No other skin or systemic complaints except as noted in HPI or Assessment and Plan.  Objective  Well appearing patient in no apparent distress; mood and affect are within normal limits.  A focused examination was performed including the face. Relevant physical exam findings are noted in the Assessment and Plan.  R neck x 16 (16) Erythematous stuck-on, waxy papule or plaque   Assessment & Plan  Inflamed seborrheic keratosis (16) R neck x 16 Symptomatic, irritating, patient would like treated. Destruction of lesion - R neck x 16 Complexity: simple   Destruction method comment:  Snip excision Informed consent: discussed and consent obtained   Anesthesia: the lesion was anesthetized in a standard fashion   Anesthetic:  1% lidocaine w/ epinephrine 1-100,000 buffered w/ 8.4% NaHCO3 Hemostasis achieved with:  aluminum chloride and electrodesiccation Outcome: patient tolerated procedure well with no complications   Post-procedure details: wound care instructions given    Seborrheic Keratoses - Stuck-on, waxy, tan-brown papules and/or plaques  - Benign-appearing - Discussed benign etiology and prognosis. - Observe - Call for any changes  Return in about 3 months (around 10/04/2022) for ISK/snip excision of neck.  Luther Redo, CMA, am acting as scribe for Sarina Ser, MD . Documentation: I have reviewed the above documentation for accuracy and completeness, and I agree with the  above.  Sarina Ser, MD

## 2022-07-04 NOTE — Patient Instructions (Signed)
Due to recent changes in healthcare laws, you may see results of your pathology and/or laboratory studies on MyChart before the doctors have had a chance to review them. We understand that in some cases there may be results that are confusing or concerning to you. Please understand that not all results are received at the same time and often the doctors may need to interpret multiple results in order to provide you with the best plan of care or course of treatment. Therefore, we ask that you please give us 2 business days to thoroughly review all your results before contacting the office for clarification. Should we see a critical lab result, you will be contacted sooner.   If You Need Anything After Your Visit  If you have any questions or concerns for your doctor, please call our main line at [phone] and press option 4 to reach your doctor's medical assistant. If no one answers, please leave a voicemail as directed and we will return your call as soon as possible. Messages left after 4 pm will be answered the following business day.   You may also send us a message via MyChart. We typically respond to MyChart messages within 1-2 business days.  For prescription refills, please ask your pharmacy to contact our office. Our fax number is [phone].  If you have an urgent issue when the clinic is closed that cannot wait until the next business day, you can page your doctor at the number below.    Please note that while we do our best to be available for urgent issues outside of office hours, we are not available 24/7.   If you have an urgent issue and are unable to reach us, you may choose to seek medical care at your doctor's office, retail clinic, urgent care center, or emergency room.  If you have a medical emergency, please immediately call 911 or go to the emergency department.  Pager Numbers  - Dr. Kowalski: [phone]  - Dr. Moye: [phone]  - Dr. Stewart:  [phone]  In the event of inclement weather, please call our main line at [phone] for an update on the status of any delays or closures.  Dermatology Medication Tips: Please keep the boxes that topical medications come in in order to help keep track of the instructions about where and how to use these. Pharmacies typically print the medication instructions only on the boxes and not directly on the medication tubes.   If your medication is too expensive, please contact our office at [phone] option 4 or send us a message through MyChart.   We are unable to tell what your co-pay for medications will be in advance as this is different depending on your insurance coverage. However, we may be able to find a substitute medication at lower cost or fill out paperwork to get insurance to cover a needed medication.   If a prior authorization is required to get your medication covered by your insurance company, please allow us 1-2 business days to complete this process.  Drug prices often vary depending on where the prescription is filled and some pharmacies may offer cheaper prices.  The website www.goodrx.com contains coupons for medications through different pharmacies. The prices here do not account for what the cost may be with help from insurance (it may be cheaper with your insurance), but the website can give you the price if you did not use any insurance.  - You can print the associated coupon and take it with   your prescription to the pharmacy.  - You may also stop by our office during regular business hours and pick up a GoodRx coupon card.  - If you need your prescription sent electronically to a different pharmacy, notify our office through [facility] MyChart or by phone at [phone] option 4.     Si Usted Necesita Algo Despus de Su Visita  Tambin puede enviarnos un mensaje a travs de MyChart. Por lo general respondemos a los mensajes de MyChart en el transcurso de 1 a 2  das hbiles.  Para renovar recetas, por favor pida a su farmacia que se ponga en contacto con nuestra oficina. Nuestro nmero de fax es el [phone].  Si tiene un asunto urgente cuando la clnica est cerrada y que no puede esperar hasta el siguiente da hbil, puede llamar/localizar a su doctor(a) al nmero que aparece a continuacin.   Por favor, tenga en cuenta que aunque hacemos todo lo posible para estar disponibles para asuntos urgentes fuera del horario de oficina, no estamos disponibles las 24 horas del da, los 7 das de la semana.   Si tiene un problema urgente y no puede comunicarse con nosotros, puede optar por buscar atencin mdica  en el consultorio de su doctor(a), en una clnica privada, en un centro de atencin urgente o en una sala de emergencias.  Si tiene una emergencia mdica, por favor llame inmediatamente al 911 o vaya a la sala de emergencias.  Nmeros de bper  - Dr. Kowalski: [phone]  - Dra. Moye: [phone]  - Dra. Stewart: [phone]  En caso de inclemencias del tiempo, por favor llame a nuestra lnea principal al [phone] para una actualizacin sobre el estado de cualquier retraso o cierre.  Consejos para la medicacin en dermatologa: Por favor, guarde las cajas en las que vienen los medicamentos de uso tpico para ayudarle a seguir las instrucciones sobre dnde y cmo usarlos. Las farmacias generalmente imprimen las instrucciones del medicamento slo en las cajas y no directamente en los tubos del medicamento.   Si su medicamento es muy caro, por favor, pngase en contacto con nuestra oficina llamando al [phone] y presione la opcin 4 o envenos un mensaje a travs de MyChart.   No podemos decirle cul ser su copago por los medicamentos por adelantado ya que esto es diferente dependiendo de la cobertura de su seguro. Sin embargo, es posible que podamos encontrar un medicamento sustituto a menor costo o llenar un formulario para que el  seguro cubra el medicamento que se considera necesario.   Si se requiere una autorizacin previa para que su compaa de seguros cubra su medicamento, por favor permtanos de 1 a 2 das hbiles para completar este proceso.  Los precios de los medicamentos varan con frecuencia dependiendo del lugar de dnde se surte la receta y alguna farmacias pueden ofrecer precios ms baratos.  El sitio web www.goodrx.com tiene cupones para medicamentos de diferentes farmacias. Los precios aqu no tienen en cuenta lo que podra costar con la ayuda del seguro (puede ser ms barato con su seguro), pero el sitio web puede darle el precio si no utiliz ningn seguro.  - Puede imprimir el cupn correspondiente y llevarlo con su receta a la farmacia.  - Tambin puede pasar por nuestra oficina durante el horario de atencin regular y recoger una tarjeta de cupones de GoodRx.  - Si necesita que su receta se enve electrnicamente a una farmacia diferente, informe a nuestra oficina a travs de MyChart de [facility]   o por telfono llamando al [phone] y presione la opcin 4.  

## 2022-07-14 ENCOUNTER — Encounter: Payer: Self-pay | Admitting: Dermatology

## 2022-08-07 NOTE — Progress Notes (Unsigned)
Name: Fernando Hart   MRN: [id number]    DOB: 17-Dec-1951   Date:08/08/2022       Progress Note  Subjective  Chief Complaint  Follow Up  HPI  HTN: he is compliant with bp medication, he denies chest pain , palpitation or dizziness. BP is at goal today, he is off hctz - currently to Exforge 5/160 mg but does not like the ankle edema. We will try to switch to Bystolic and valsartan    CKI stage III: discussed importance of cutting down on NSAID's and taking Tylenol instead of pain. He denies pruritis, good urine output  His GFR is back to normal now, he is on ARB    Hyperlipidemia: he used to take Crestor occasionally but once he started to take it daily a couple of months ago he developed severe muscle pain and stopped crestor, but now he has been off medication and still has the pain, but he does not want to go back on statin therapy, taking otc fish oil and LDL was 103 - discussed risk below . We can try zetia   The 10-year ASCVD risk score (Arnett DK, et al., 2019) is: 23.1%   Values used to calculate the score:     Age: 71 years     Sex: Male     Is Non-Hispanic African American: Yes     Diabetic: No     Tobacco smoker: No     Systolic Blood Pressure: 329 mmHg     Is BP treated: Yes     HDL Cholesterol: 36 mg/dL     Total Cholesterol: 147 mg/dL    Hyperglycemia: He denies polyphagia, polydipsia or polyuria.He stopped Metformin because it made him feel fatigued, changed diet and his A1C is 5.7 %    Gout: he stopped Uloric on his own months ago , no recent episodes of gout He states colchicine was not at the pharmacy , [facility]   Patient Active Problem List   Diagnosis Date Noted   Stage 3a chronic kidney disease ([place]) 11/04/2020   Vitamin D deficiency 11/04/2020   Intermittent low back pain 03/24/2018   Tinnitus of right ear 07/12/2017   Dyslipidemia 10/11/2016   Obesity 10/11/2016   Degenerative joint disease of right hip 02/28/2016   Prediabetes 01/10/2016    Controlled gout 01/10/2016   Essential hypertension 01/10/2016    Past Surgical History:  Procedure Laterality Date   ROOT CANAL      Family History  Problem Relation Age of Onset   Hypertension Mother    Diabetes Father    Diabetes Sister    GI Bleed Daughter     Social History   Tobacco Use   Smoking status: Former    Packs/day: 1.00    Years: 35.00    Total pack years: 35.00    Types: Cigarettes    Quit date: 07/2004    Years since quitting: 18.0   Smokeless tobacco: Never  Substance Use Topics   Alcohol use: Yes    Alcohol/week: 1.0 standard drink of alcohol    Types: 1 Shots of liquor per week    Comment: not every week     Current Outpatient Medications:    ezetimibe (ZETIA) 10 MG tablet, Take 1 tablet (10 mg total) by mouth daily., Disp: 90 tablet, Rfl: 3   nebivolol (BYSTOLIC) 5 MG tablet, Take 1 tablet (5 mg total) by mouth daily., Disp: 90 tablet, Rfl: 0   valsartan (DIOVAN)  160 MG tablet, Take 1 tablet (160 mg total) by mouth daily., Disp: 90 tablet, Rfl: 0   colchicine 0.6 MG tablet, Take 1 tablet (0.6 mg total) by mouth daily. May repeat in 1 hour if no improvement no more than 2 in 24 hours, Disp: 30 tablet, Rfl: 0   diclofenac Sodium (VOLTAREN) 1 % GEL, APPLY 2 GRAMS TOPICALLY TO AFFECTED AREA FOUR TIMES DAILY, Disp: 300 g, Rfl: 1  No Known Allergies  I personally reviewed active problem list, medication list, allergies, family history, social history, health maintenance with the patient/caregiver today.   ROS  Constitutional: Negative for fever or weight change.  Respiratory: Negative for cough and shortness of breath.   Cardiovascular: Negative for chest pain or palpitations.  Gastrointestinal: Negative for abdominal pain, no bowel changes.  Musculoskeletal: Negative for gait problem or joint swelling.  Skin: Negative for rash.  Neurological: Negative for dizziness or headache.  No other specific complaints in a complete review of systems  (except as listed in HPI above).   Objective  Vitals:   08/08/22 0828  BP: 138/70  Pulse: 92  Resp: 16  SpO2: 95%  Weight: 203 lb (92.1 kg)  Height: '5\' 6"'$  (1.676 m)    Body mass index is 32.77 kg/m.  Physical Exam  Constitutional: Patient appears well-developed and well-nourished. Obese  No distress.  HEENT: head atraumatic, normocephalic, pupils equal and reactive to light, neck supple Cardiovascular: Normal rate, regular rhythm and normal heart sounds.  No murmur heard. No BLE edema. Pulmonary/Chest: Effort normal and breath sounds normal. No respiratory distress. Abdominal: Soft.  There is no tenderness. Psychiatric: Patient has a normal mood and affect. behavior is normal. Judgment and thought content normal.   PHQ2/9:    08/08/2022    8:27 AM 11/07/2021    7:31 AM 05/04/2021    7:31 AM 11/04/2020    7:58 AM 05/04/2020    8:33 AM  Depression screen PHQ 2/9  Decreased Interest 0 0 0 0 0  Down, Depressed, Hopeless 0 0 0 0 0  PHQ - 2 Score 0 0 0 0 0  Altered sleeping 0 0  1 0  Tired, decreased energy 0 0  0 0  Change in appetite 0 0  0 0  Feeling bad or failure about yourself  0 0  0 0  Trouble concentrating 0 0  0 0  Moving slowly or fidgety/restless 0 0  0 0  Suicidal thoughts 0 0  0 0  PHQ-9 Score 0 0  1 0  Difficult doing work/chores    Not difficult at all     phq 9 is negative   Fall Risk:    08/08/2022    8:27 AM 11/07/2021    7:31 AM 05/04/2021    7:30 AM 11/04/2020    7:58 AM 05/04/2020    8:33 AM  Fall Risk   Falls in the past year? 0 0 0 0 0  Number falls in past yr: 0 0 0 0 0  Injury with Fall? 0 0 0 0 0  Risk for fall due to : No Fall Risks No Fall Risks     Follow up Falls prevention discussed Falls prevention discussed         Functional Status Survey: Is the patient deaf or have difficulty hearing?: No Does the patient have difficulty seeing, even when wearing glasses/contacts?: No Does the patient have difficulty concentrating,  remembering, or making decisions?: No Does the patient have difficulty walking  or climbing stairs?: No Does the patient have difficulty dressing or bathing?: No Does the patient have difficulty doing errands alone such as visiting a doctor's office or shopping?: No    Assessment & Plan  1. Essential hypertension  - valsartan (DIOVAN) 160 MG tablet; Take 1 tablet (160 mg total) by mouth daily.  Dispense: 90 tablet; Refill: 0 - nebivolol (BYSTOLIC) 5 MG tablet; Take 1 tablet (5 mg total) by mouth daily.  Dispense: 90 tablet; Refill: 0  2. Vitamin D deficiency   3. Dyslipidemia  - ezetimibe (ZETIA) 10 MG tablet; Take 1 tablet (10 mg total) by mouth daily.  Dispense: 90 tablet; Refill: 3  4. Controlled gout  - colchicine 0.6 MG tablet; Take 1 tablet (0.6 mg total) by mouth daily. May repeat in 1 hour if no improvement no more than 2 in 24 hours  Dispense: 30 tablet; Refill: 0  5. Primary osteoarthritis of both knees  - diclofenac Sodium (VOLTAREN) 1 % GEL; APPLY 2 GRAMS TOPICALLY TO AFFECTED AREA FOUR TIMES DAILY  Dispense: 300 g; Refill: 1  6. Need for Tdap vaccination  - Tdap vaccine greater than or equal to 7yo IM

## 2022-08-08 ENCOUNTER — Ambulatory Visit: Payer: Managed Care, Other (non HMO) | Admitting: Family Medicine

## 2022-08-08 ENCOUNTER — Encounter: Payer: Self-pay | Admitting: Family Medicine

## 2022-08-08 VITALS — BP 138/70 | HR 92 | Resp 16 | Ht 66.0 in | Wt 203.0 lb

## 2022-08-08 DIAGNOSIS — M109 Gout, unspecified: Secondary | ICD-10-CM

## 2022-08-08 DIAGNOSIS — I10 Essential (primary) hypertension: Secondary | ICD-10-CM

## 2022-08-08 DIAGNOSIS — E785 Hyperlipidemia, unspecified: Secondary | ICD-10-CM | POA: Diagnosis not present

## 2022-08-08 DIAGNOSIS — M170 Bilateral primary osteoarthritis of knee: Secondary | ICD-10-CM

## 2022-08-08 DIAGNOSIS — E559 Vitamin D deficiency, unspecified: Secondary | ICD-10-CM | POA: Diagnosis not present

## 2022-08-08 DIAGNOSIS — Z1211 Encounter for screening for malignant neoplasm of colon: Secondary | ICD-10-CM

## 2022-08-08 DIAGNOSIS — Z23 Encounter for immunization: Secondary | ICD-10-CM

## 2022-08-08 MED ORDER — NEBIVOLOL HCL 5 MG PO TABS: 5.0000 mg | ORAL_TABLET | Freq: Every day | ORAL | 0 refills | Status: DC

## 2022-08-08 MED ORDER — EZETIMIBE 10 MG PO TABS: 10.0000 mg | ORAL_TABLET | Freq: Every day | ORAL | 3 refills | Status: DC

## 2022-08-08 MED ORDER — VALSARTAN 160 MG PO TABS: 160.0000 mg | ORAL_TABLET | Freq: Every day | ORAL | 0 refills | Status: DC

## 2022-08-08 MED ORDER — COLCHICINE 0.6 MG PO TABS: 0.6000 mg | ORAL_TABLET | Freq: Every day | ORAL | 0 refills | Status: DC

## 2022-08-08 MED ORDER — DICLOFENAC SODIUM 1 % EX GEL: CUTANEOUS | 1 refills | Status: DC

## 2022-08-10 ENCOUNTER — Ambulatory Visit: Payer: Managed Care, Other (non HMO) | Admitting: Podiatry

## 2022-08-10 DIAGNOSIS — M79674 Pain in right toe(s): Secondary | ICD-10-CM | POA: Diagnosis not present

## 2022-08-10 DIAGNOSIS — B351 Tinea unguium: Secondary | ICD-10-CM

## 2022-08-10 DIAGNOSIS — M79675 Pain in left toe(s): Secondary | ICD-10-CM | POA: Diagnosis not present

## 2022-08-10 NOTE — Progress Notes (Signed)
   Chief Complaint  Patient presents with   Nail Problem    2nd toenail right foot nail turning / possible fungus on bil nails, great toe nails are painful.    SUBJECTIVE Patient presents to office today complaining of elongated, thickened nails that cause pain while ambulating in shoes.  Patient is unable to trim their own nails. Patient is here for further evaluation and treatment.  Past Medical History:  Diagnosis Date   Gout    Hypertension    Vitamin D deficiency     OBJECTIVE General Patient is awake, alert, and oriented x 3 and in no acute distress. Derm Skin is dry and supple bilateral. Negative open lesions or macerations. Remaining integument unremarkable. Nails are tender, long, thickened and dystrophic with subungual debris, consistent with onychomycosis, 1-5 bilateral. No signs of infection noted. Vasc  DP and PT pedal pulses palpable bilaterally. Temperature gradient within normal limits.  Neuro Epicritic and protective threshold sensation grossly intact bilaterally.  Musculoskeletal Exam No symptomatic pedal deformities noted bilateral. Muscular strength within normal limits.  ASSESSMENT 1.  Pain due to onychomycosis of toenails both  PLAN OF CARE 1. Patient evaluated today.  2. Instructed to maintain good pedal hygiene and foot care.  3. Mechanical debridement of nails 1-5 bilaterally performed using a nail nipper. Filed with dremel without incident.  4. Return to clinic in 3 mos.    Edrick Kins, DPM [facility]  Dr. Edrick Kins, DPM    [address], [place] [address]                Office [phone]  Fax [phone]

## 2022-08-13 ENCOUNTER — Encounter: Payer: Self-pay | Admitting: Family Medicine

## 2022-08-14 ENCOUNTER — Other Ambulatory Visit: Payer: Self-pay | Admitting: Family Medicine

## 2022-08-14 DIAGNOSIS — I10 Essential (primary) hypertension: Secondary | ICD-10-CM

## 2022-08-14 DIAGNOSIS — M170 Bilateral primary osteoarthritis of knee: Secondary | ICD-10-CM

## 2022-08-14 MED ORDER — DICLOFENAC SODIUM 1 % EX GEL: CUTANEOUS | 1 refills | Status: DC

## 2022-08-14 MED ORDER — NEBIVOLOL HCL 5 MG PO TABS: 5.0000 mg | ORAL_TABLET | Freq: Every day | ORAL | 0 refills | Status: DC

## 2022-09-06 ENCOUNTER — Other Ambulatory Visit: Payer: Self-pay | Admitting: Family Medicine

## 2022-09-06 ENCOUNTER — Other Ambulatory Visit: Payer: Self-pay

## 2022-09-06 ENCOUNTER — Ambulatory Visit (INDEPENDENT_AMBULATORY_CARE_PROVIDER_SITE_OTHER): Payer: Managed Care, Other (non HMO)

## 2022-09-06 VITALS — BP 150/74

## 2022-09-06 DIAGNOSIS — Z23 Encounter for immunization: Secondary | ICD-10-CM

## 2022-09-06 DIAGNOSIS — I10 Essential (primary) hypertension: Secondary | ICD-10-CM

## 2022-09-06 MED ORDER — VALSARTAN 320 MG PO TABS: 320.0000 mg | ORAL_TABLET | Freq: Every day | ORAL | 0 refills | Status: DC

## 2022-09-06 NOTE — Telephone Encounter (Signed)
Patient was her for BP check. You decided to double it up for him.

## 2022-10-09 ENCOUNTER — Other Ambulatory Visit: Payer: Self-pay | Admitting: Family Medicine

## 2022-10-09 DIAGNOSIS — I10 Essential (primary) hypertension: Secondary | ICD-10-CM

## 2022-10-10 ENCOUNTER — Ambulatory Visit: Payer: Managed Care, Other (non HMO) | Admitting: Dermatology

## 2022-10-15 ENCOUNTER — Ambulatory Visit
Admission: RE | Admit: 2022-10-15 | Discharge: 2022-10-15 | Disposition: A | Discharge status: Home / Self Care | Payer: Managed Care, Other (non HMO) | Source: Ambulatory Visit | Attending: Emergency Medicine | Admitting: Emergency Medicine

## 2022-10-15 VITALS — BP 159/82 | HR 66 | Temp 98.5°F | Ht 67.0 in | Wt 200.0 lb

## 2022-10-15 DIAGNOSIS — N451 Epididymitis: Secondary | ICD-10-CM

## 2022-10-15 MED ORDER — DOXYCYCLINE HYCLATE 100 MG PO CAPS: 100.0000 mg | ORAL_CAPSULE | Freq: Two times a day (BID) | ORAL | 0 refills | Status: AC

## 2022-10-15 MED ORDER — IBUPROFEN 600 MG PO TABS: 600.0000 mg | ORAL_TABLET | Freq: Four times a day (QID) | ORAL | 0 refills | Status: DC | PRN

## 2022-10-15 NOTE — ED Triage Notes (Signed)
Pt c/o LT groin pain, pt states pain started x3 weeks ago, pt does report some pain in LT testicle when he walks started x1 week later. Pt denies any urinary symptoms, denies any swelling.

## 2022-10-15 NOTE — Discharge Instructions (Addendum)
Take the doxycycline twice daily for 7 days for treatment of your epididymitis.  Use over-the-counter Tylenol and ibuprofen as needed for discomfort.  Wear supportive underwear or jockstrap to support your scrotum and prevent further irritation of your epididymis.  If your symptoms do not improve you need to follow-up with urology.  Narcotic information has been included in your discharge instructions.

## 2022-10-15 NOTE — ED Provider Notes (Signed)
[facility]    CSN: [id number] Arrival date & time: 10/15/22  6503      History   Chief Complaint Chief Complaint  Patient presents with   Groin Pain    LT groin     HPI Fernando Hart is a 71 y.o. male.   HPI  71 year old male here for evaluation of left groin pain.  Patient reports that he has been experiencing pain in his left inguinal crease for the last 3 weeks.  He also developed some pain in his left testicle 1 week ago.  This is not associated with any known precipitating event the patient does admit that he does lifting from time to time.  He has not noticed any swelling in his groin or his scrotum.  He denies any pain with urination or urinary urgency or frequency.  No changes in his stream.  The pain is worse with walking and with certain movements but it is better at rest.  Past Medical History:  Diagnosis Date   Gout    Hypertension    Vitamin D deficiency     Patient Active Problem List   Diagnosis Date Noted   Stage 3a chronic kidney disease ([place]) 11/04/2020   Vitamin D deficiency 11/04/2020   Intermittent low back pain 03/24/2018   Tinnitus of right ear 07/12/2017   Dyslipidemia 10/11/2016   Obesity 10/11/2016   Degenerative joint disease of right hip 02/28/2016   Prediabetes 01/10/2016   Controlled gout 01/10/2016   Essential hypertension 01/10/2016    Past Surgical History:  Procedure Laterality Date   ROOT CANAL         Home Medications    Prior to Admission medications   Medication Sig Start Date End Date Taking? Authorizing Provider  colchicine 0.6 MG tablet Take 1 tablet (0.6 mg total) by mouth daily. May repeat in 1 hour if no improvement no more than 2 in 24 hours 08/08/22  Yes Sowles, Drue Stager, MD  diclofenac Sodium (VOLTAREN) 1 % GEL APPLY 2 GRAMS TOPICALLY TO AFFECTED AREA FOUR TIMES DAILY 08/14/22  Yes Sowles, Drue Stager, MD  doxycycline (VIBRAMYCIN) 100 MG capsule Take 1 capsule (100 mg total) by mouth 2 (two) times  daily for 7 days. 10/15/22 10/22/22 Yes Margarette Canada, NP  ezetimibe (ZETIA) 10 MG tablet Take 1 tablet (10 mg total) by mouth daily. 08/08/22  Yes Sowles, Drue Stager, MD  ibuprofen (ADVIL) 600 MG tablet Take 1 tablet (600 mg total) by mouth every 6 (six) hours as needed. 10/15/22  Yes Margarette Canada, NP  nebivolol (BYSTOLIC) 5 MG tablet Take 1 tablet (5 mg total) by mouth daily. 08/14/22  Yes Sowles, Drue Stager, MD  valsartan (DIOVAN) 320 MG tablet Take 1 tablet (320 mg total) by mouth daily. 09/06/22  Yes Steele Sizer, MD    Family History Family History  Problem Relation Age of Onset   Hypertension Mother    Diabetes Father    Diabetes Sister    GI Bleed Daughter     Social History Social History   Tobacco Use   Smoking status: Former    Packs/day: 1.00    Years: 35.00    Total pack years: 35.00    Types: Cigarettes    Quit date: 07/2004    Years since quitting: 18.2   Smokeless tobacco: Never  Vaping Use   Vaping Use: Never used  Substance Use Topics   Alcohol use: Yes    Alcohol/week: 1.0 standard drink of alcohol    Types: 1 Shots  of liquor per week    Comment: not every week   Drug use: Yes     Allergies   Norvasc [amlodipine]   Review of Systems Review of Systems  Constitutional:  Negative for fever.  Gastrointestinal:  Positive for abdominal pain.       Pain in the left inguinal crease.  Genitourinary:  Positive for testicular pain. Negative for decreased urine volume, difficulty urinating, dysuria, frequency, scrotal swelling and urgency.     Physical Exam Triage Vital Signs ED Triage Vitals  Enc Vitals Group     BP      Pulse      Resp      Temp      Temp src      SpO2      Weight      Height      Head Circumference      Peak Flow      Pain Score      Pain Loc      Pain Edu?      Excl. in [place]?    No data found.  Updated Vital Signs BP (!) 159/82 (BP Location: Right Arm)   Pulse 66   Temp 98.5 F (36.9 C) (Oral)   Ht '5\' 7"'$  (1.702 m)   Wt  200 lb (90.7 kg)   SpO2 96%   BMI 31.32 kg/m   Visual Acuity Right Eye Distance:   Left Eye Distance:   Bilateral Distance:    Right Eye Near:   Left Eye Near:    Bilateral Near:     Physical Exam Vitals and nursing note reviewed.  Constitutional:      Appearance: Normal appearance. He is not ill-appearing.  HENT:     Head: Normocephalic and atraumatic.  Abdominal:     General: Abdomen is flat.     Palpations: Abdomen is soft.     Tenderness: There is no abdominal tenderness. There is no guarding or rebound.     Hernia: No hernia is present.     Comments: No direct or indirect inguinal hernia appreciated.  No inguinal lymphadenopathy appreciated.  Genitourinary:    Comments: Patient has tenderness and inflammation of bilateral epididymis complexes.  The scrotum is normal in appearance.  Both testes are smooth, equal size, and nontender. Musculoskeletal:        General: No swelling or tenderness. Normal range of motion.     Comments: Patient has tenderness with palpation of the inguinal crease but no tenderness with palpation of the quadriceps complex or the inguinal muscle complex.  No pain with passive range of motion of the left hip.  Skin:    General: Skin is warm and dry.     Capillary Refill: Capillary refill takes less than 2 seconds.     Findings: No erythema or rash.  Neurological:     General: No focal deficit present.     Mental Status: He is alert.  Psychiatric:        Mood and Affect: Mood normal.        Behavior: Behavior normal.        Thought Content: Thought content normal.        Judgment: Judgment normal.      UC Treatments / Results  Labs (all labs ordered are listed, but only abnormal results are displayed) Labs Reviewed - No data to display  EKG   Radiology No results found.  Procedures Procedures (including critical care time)  Medications Ordered in  UC Medications - No data to display  Initial Impression / Assessment and Plan / UC  Course  I have reviewed the triage vital signs and the nursing notes.  Pertinent labs & imaging results that were available during my care of the patient were reviewed by me and considered in my medical decision making (see chart for details).   Patient is a pleasant, nontoxic-appearing 71 year old male here for evaluation of 3 weeks worth of left groin pain with 1 week worth of left testicular pain as outlined in HPI above.  On exam patient's abdomen is benign.  Is protuberant but it is nontender.  No direct or indirect inguinal hernias appreciated.  No tenderness or spasm of any of the groin or thigh muscles.  There is no pain with range of motion of the left hip passively.  He does have inflammation of both epididymis complexes with tenderness.  No no beading of the spermatic cord appreciated.  Testicles are smooth, nontender, and equal in size.  I suspect that the patient's pain is coming from epididymitis.  I will cover him for potential bacterial sources with doxycycline twice daily for 7 days and have advised him to wear supportive underwear or a jockstrap to take weight off of his scrotum and off the spermatic cord and epididymis complex to decrease inflammation.  I have also prescribed him 600 mg ibuprofen that he can take as needed for pain every 6 hours with food.  I have advised him not to take this with the diclofenac gel or with his colchicine.  If his symptoms not proving he is to return for reevaluation, see his PCP, or see urology.   Final Clinical Impressions(s) / UC Diagnoses   Final diagnoses:  Epididymitis     Discharge Instructions      Take the doxycycline twice daily for 7 days for treatment of your epididymitis.  Use over-the-counter Tylenol and ibuprofen as needed for discomfort.  Wear supportive underwear or jockstrap to support your scrotum and prevent further irritation of your epididymis.  If your symptoms do not improve you need to follow-up with urology.   Narcotic information has been included in your discharge instructions.      ED Prescriptions     Medication Sig Dispense Auth. Provider   doxycycline (VIBRAMYCIN) 100 MG capsule Take 1 capsule (100 mg total) by mouth 2 (two) times daily for 7 days. 14 capsule Margarette Canada, NP   ibuprofen (ADVIL) 600 MG tablet Take 1 tablet (600 mg total) by mouth every 6 (six) hours as needed. 30 tablet Margarette Canada, NP      PDMP not reviewed this encounter.   Margarette Canada, NP 10/15/22 [phone]

## 2022-11-05 NOTE — Progress Notes (Unsigned)
Name: Fernando Hart   MRN: [id number]    DOB: Apr 28, 1951   Date:11/06/2022       Progress Note  Subjective  Chief Complaint  Follow Up  HPI  HTN: he is compliant with bp medication, he denies chest pain , palpitation or dizziness. BP was at goal on Lotrel but it caused lower extremity edema, we changed to valsartan 962 mg and bystolic 5 mg but bp at home has been between 135-150's and today is high in our office, we will adjust dose of bystolic to 10 mg and if remains above 135 he can double the dose to 20 mg.   CKI stage III: discussed importance of cutting down on NSAID's and taking Tylenol instead of pain. He denies pruritis, good urine output  His GFR is back to normal now, he is on ARB . We will give him celebrex to take for one week and after that prn for his hip We will recheck labs today including vitamin D level  B12 deficiency: he has not been taking supplementation lately, we will recheck level today    Hyperlipidemia: he used to take Crestor occasionally but once he started to take it daily a couple of months ago he developed severe muscle pain and stopped crestor, but now he has been off medication and still has the pain, but he does not want to go back on statin therapy, taking otc fish oil and LDL was 103 - discussed risk below .He is now taking zetia and we will recheck labs   The 10-year ASCVD risk score (Arnett DK, et al., 2019) is: 30.9%*   Values used to calculate the score:     Age: 71 years     Sex: Male     Is Non-Hispanic African American: Yes     Diabetic: No     Tobacco smoker: No     Systolic Blood Pressure: 952 mmHg     Is BP treated: Yes     HDL Cholesterol: 33 mg/dL*     Total Cholesterol: 150 mg/dL*     * - Cholesterol units were assumed for this score calculation    Hyperglycemia: He denies polyphagia, polydipsia or polyuria.He stopped Metformin because it made him feel fatigued. We will recheck A1C   Gout: he stopped Uloric on his own months ago , no  recent episodes of gout He states colchicine was not at the pharmacy , we will recheck labs   Left groin pain: he states the pain started gradually end of September, he states pain got worse and he went to urgent care on 10/30, diagnosed with epididymitis and was given ibuprofen plus doxy. Symptoms improved but still present, does not bother him at night, but starts when he gets out of bed and worse towards the end of the day. He has an antalgic gait but he states that has been present for a long time due to hip pain  Patient Active Problem List   Diagnosis Date Noted   Stage 3a chronic kidney disease ([place]) 11/04/2020   Vitamin D deficiency 11/04/2020   Intermittent low back pain 03/24/2018   Tinnitus of right ear 07/12/2017   Dyslipidemia 10/11/2016   Obesity 10/11/2016   Degenerative joint disease of right hip 02/28/2016   Prediabetes 01/10/2016   Controlled gout 01/10/2016   Essential hypertension 01/10/2016    Past Surgical History:  Procedure Laterality Date   ROOT CANAL      Family History  Problem Relation Age of Onset  Hypertension Mother    Diabetes Father    Diabetes Sister    GI Bleed Daughter     Social History   Tobacco Use   Smoking status: Former    Packs/day: 1.00    Years: 35.00    Total pack years: 35.00    Types: Cigarettes    Quit date: 07/2004    Years since quitting: 18.3   Smokeless tobacco: Never  Substance Use Topics   Alcohol use: Yes    Alcohol/week: 1.0 standard drink of alcohol    Types: 1 Shots of liquor per week    Comment: not every week     Current Outpatient Medications:    colchicine 0.6 MG tablet, Take 1 tablet (0.6 mg total) by mouth daily. May repeat in 1 hour if no improvement no more than 2 in 24 hours, Disp: 30 tablet, Rfl: 0   diclofenac Sodium (VOLTAREN) 1 % GEL, APPLY 2 GRAMS TOPICALLY TO AFFECTED AREA FOUR TIMES DAILY, Disp: 300 g, Rfl: 1   ezetimibe (ZETIA) 10 MG tablet, Take 1 tablet (10 mg total) by mouth daily.,  Disp: 90 tablet, Rfl: 3   ibuprofen (ADVIL) 600 MG tablet, Take 1 tablet (600 mg total) by mouth every 6 (six) hours as needed., Disp: 30 tablet, Rfl: 0   nebivolol (BYSTOLIC) 5 MG tablet, Take 1 tablet (5 mg total) by mouth daily., Disp: 90 tablet, Rfl: 0   valsartan (DIOVAN) 320 MG tablet, Take 1 tablet (320 mg total) by mouth daily., Disp: 90 tablet, Rfl: 0  Allergies  Allergen Reactions   Norvasc [Amlodipine] Swelling    I personally reviewed active problem list, medication list, allergies, family history, social history, health maintenance with the patient/caregiver today.   ROS  Constitutional: Negative for fever or weight change.  Respiratory: Negative for cough and shortness of breath.   Cardiovascular: Negative for chest pain or palpitations.  Gastrointestinal: Negative for abdominal pain, no bowel changes.  Musculoskeletal: positive e for gait problem but no  joint swelling.  Skin: Negative for rash.  Neurological: Negative for dizziness or headache.  No other specific complaints in a complete review of systems (except as listed in HPI above).   Objective  Vitals:   11/06/22 1020 11/06/22 1036  BP: (!) 172/84 (!) 162/78  Pulse: 82   Resp: 16   SpO2: 96%   Weight: 203 lb (92.1 kg)   Height: '5\' 7"'$  (1.702 m)     Body mass index is 31.79 kg/m.  Physical Exam  Constitutional: Patient appears well-developed and well-nourished. Obese  No distress.  HEENT: head atraumatic, normocephalic, pupils equal and reactive to light, neck supple Cardiovascular: Normal rate, regular rhythm and normal heart sounds.  No murmur heard. No BLE edema. Pulmonary/Chest: Effort normal and breath sounds normal. No respiratory distress. Abdominal: Soft.  There is no tenderness. Muscular skeletal: he has difficulty lifting left lower leg when standing on right, antalgic gait, decrease in external rotation of hips.  Psychiatric: Patient has a normal mood and affect. behavior is normal.  Judgment and thought content normal.    PHQ2/9:    11/06/2022   10:20 AM 08/08/2022    8:27 AM 11/07/2021    7:31 AM 05/04/2021    7:31 AM 11/04/2020    7:58 AM  Depression screen PHQ 2/9  Decreased Interest 0 0 0 0 0  Down, Depressed, Hopeless 0 0 0 0 0  PHQ - 2 Score 0 0 0 0 0  Altered sleeping 0 0 0  1  Tired, decreased energy 0 0 0  0  Change in appetite 0 0 0  0  Feeling bad or failure about yourself  0 0 0  0  Trouble concentrating 0 0 0  0  Moving slowly or fidgety/restless 0 0 0  0  Suicidal thoughts 0 0 0  0  PHQ-9 Score 0 0 0  1  Difficult doing work/chores     Not difficult at all    phq 9 is negative   Fall Risk:    11/06/2022   10:20 AM 08/08/2022    8:27 AM 11/07/2021    7:31 AM 05/04/2021    7:30 AM 11/04/2020    7:58 AM  Fall Risk   Falls in the past year? 0 0 0 0 0  Number falls in past yr: 0 0 0 0 0  Injury with Fall? 0 0 0 0 0  Risk for fall due to : No Fall Risks No Fall Risks No Fall Risks    Follow up Falls prevention discussed Falls prevention discussed Falls prevention discussed        Functional Status Survey: Is the patient deaf or have difficulty hearing?: No Does the patient have difficulty seeing, even when wearing glasses/contacts?: No Does the patient have difficulty concentrating, remembering, or making decisions?: No Does the patient have difficulty walking or climbing stairs?: No Does the patient have difficulty dressing or bathing?: No Does the patient have difficulty doing errands alone such as visiting a doctor's office or shopping?: No    Assessment & Plan  1. Essential hypertension  - nebivolol (BYSTOLIC) 10 MG tablet; Take 1 tablet (10 mg total) by mouth daily.  Dispense: 90 tablet; Refill: 0  2. Vitamin D deficiency  - VITAMIN D 25 Hydroxy (Vit-D Deficiency, Fractures)  3. Dyslipidemia  - Lipid panel  4. Controlled gout  - Uric acid  5. Primary osteoarthritis of both knees  Continue topical medication    6. Stage 3a chronic kidney disease (HCC)  - Comprehensive metabolic panel - CBC with Differential/Platelet  7. Low vitamin B12 level  - Vitamin B12  8. Hyperglycemia  - Hemoglobin A1c  9. Epididymitis, left  - Ambulatory referral to Urology   10. Antalgic gait  Likely due to left hip pain  11. Left hip pain  - celecoxib (CELEBREX) 200 MG capsule; Take 1 capsule (200 mg total) by mouth daily after breakfast.  Dispense: 30 capsule; Refill: 0 - Ambulatory referral to Orthopedic Surgery

## 2022-11-06 ENCOUNTER — Ambulatory Visit: Payer: Managed Care, Other (non HMO) | Admitting: Family Medicine

## 2022-11-06 ENCOUNTER — Encounter: Payer: Self-pay | Admitting: Family Medicine

## 2022-11-06 VITALS — BP 162/78 | HR 82 | Resp 16 | Ht 67.0 in | Wt 203.0 lb

## 2022-11-06 DIAGNOSIS — I10 Essential (primary) hypertension: Secondary | ICD-10-CM

## 2022-11-06 DIAGNOSIS — Z1211 Encounter for screening for malignant neoplasm of colon: Secondary | ICD-10-CM

## 2022-11-06 DIAGNOSIS — N1831 Chronic kidney disease, stage 3a: Secondary | ICD-10-CM

## 2022-11-06 DIAGNOSIS — R739 Hyperglycemia, unspecified: Secondary | ICD-10-CM

## 2022-11-06 DIAGNOSIS — M170 Bilateral primary osteoarthritis of knee: Secondary | ICD-10-CM

## 2022-11-06 DIAGNOSIS — M25552 Pain in left hip: Secondary | ICD-10-CM

## 2022-11-06 DIAGNOSIS — M109 Gout, unspecified: Secondary | ICD-10-CM | POA: Diagnosis not present

## 2022-11-06 DIAGNOSIS — E785 Hyperlipidemia, unspecified: Secondary | ICD-10-CM | POA: Diagnosis not present

## 2022-11-06 DIAGNOSIS — R7989 Other specified abnormal findings of blood chemistry: Secondary | ICD-10-CM

## 2022-11-06 DIAGNOSIS — N451 Epididymitis: Secondary | ICD-10-CM

## 2022-11-06 DIAGNOSIS — R2689 Other abnormalities of gait and mobility: Secondary | ICD-10-CM

## 2022-11-06 DIAGNOSIS — E559 Vitamin D deficiency, unspecified: Secondary | ICD-10-CM | POA: Diagnosis not present

## 2022-11-06 MED ORDER — NEBIVOLOL HCL 10 MG PO TABS: 10.0000 mg | ORAL_TABLET | Freq: Every day | ORAL | 0 refills | Status: DC

## 2022-11-06 MED ORDER — CELECOXIB 200 MG PO CAPS: 200.0000 mg | ORAL_CAPSULE | Freq: Every day | ORAL | 0 refills | Status: DC

## 2022-11-20 ENCOUNTER — Ambulatory Visit: Payer: Managed Care, Other (non HMO) | Admitting: Urology

## 2022-11-20 ENCOUNTER — Other Ambulatory Visit: Payer: Self-pay

## 2022-11-20 ENCOUNTER — Other Ambulatory Visit
Admission: RE | Admit: 2022-11-20 | Discharge: 2022-11-20 | Disposition: A | Discharge status: Home / Self Care | Payer: Managed Care, Other (non HMO) | Attending: Urology | Admitting: Urology

## 2022-11-20 ENCOUNTER — Encounter: Payer: Self-pay | Admitting: Urology

## 2022-11-20 VITALS — BP 183/88 | HR 64 | Ht 67.0 in | Wt 200.0 lb

## 2022-11-20 DIAGNOSIS — N50819 Testicular pain, unspecified: Secondary | ICD-10-CM

## 2022-11-20 DIAGNOSIS — N5082 Scrotal pain: Secondary | ICD-10-CM

## 2022-11-20 DIAGNOSIS — N453 Epididymo-orchitis: Secondary | ICD-10-CM

## 2022-11-20 LAB — URINALYSIS, COMPLETE (UACMP) WITH MICROSCOPIC
Bilirubin Urine: NEGATIVE
Glucose, UA: NEGATIVE mg/dL
Hgb urine dipstick: NEGATIVE
Ketones, ur: NEGATIVE mg/dL
Leukocytes,Ua: NEGATIVE
Nitrite: NEGATIVE
Protein, ur: NEGATIVE mg/dL
Specific Gravity, Urine: 1.02 (ref 1.005–1.030)
Squamous Epithelial / HPF: NONE SEEN (ref 0–5)
pH: 5.5 (ref 5.0–8.0)

## 2022-11-20 NOTE — Progress Notes (Signed)
   11/20/22 11:26 AM   Fernando Hart 1951/03/06 [id number]  CC: Left scrotal pain, epididymitis  HPI: 71 year old male who presented to urgent care on 10/15/2022 with left-sided scrotal pain, and was diagnosed with suspected epididymitis and treated with 7 days of doxycycline.  He did have some improvement at that time but had some nagging persistent mild left scrotal discomfort that he reported to his PCP a month later, and he was referred to urology.  He reports his left-sided scrotal pain has now completely resolved.  He anti-inflammatories helped previously in addition to the antibiotics.  He denies any urinary symptoms or gross hematuria.  Urinalysis today is benign.  No prior imaging to review.   PMH: Past Medical History:  Diagnosis Date   Gout    Hypertension    Vitamin D deficiency     Surgical History: Past Surgical History:  Procedure Laterality Date   ROOT CANAL     Family History: Family History  Problem Relation Age of Onset   Hypertension Mother    Diabetes Father    Diabetes Sister    GI Bleed Daughter     Social History:  reports that he quit smoking about 18 years ago. His smoking use included cigarettes. He has a 35.00 pack-year smoking history. He has never used smokeless tobacco. He reports current alcohol use of about 1.0 standard drink of alcohol per week. He reports current drug use.  Physical Exam: BP (!) 183/88 (BP Location: Left Arm, Patient Position: Sitting, Cuff Size: Normal)   Pulse 64   Ht '5\' 7"'$  (1.702 m)   Wt 200 lb (90.7 kg)   BMI 31.32 kg/m    Constitutional:  Alert and oriented, No acute distress. Cardiovascular: No clubbing, cyanosis, or edema. Respiratory: Normal respiratory effort, no increased work of breathing. GI: Abdomen is soft, nontender, nondistended, no abdominal masses GU: Uncircumcised phallus with patent meatus, no lesions, mild left epididymal tenderness, no scrotal masses or testicular masses  Assessment & Plan:    71 year old male with likely left epididymitis that has since resolved.  Reassurance and prevention strategies discussed.  Return precautions discussed.  Reassurance provided regarding benign urinalysis.  Follow-up with urology as needed   Nickolas Madrid, MD 11/20/2022  [facility] [address], [place], [place] [address] [phone]

## 2022-11-20 NOTE — Patient Instructions (Signed)
Epididymitis  Epididymitis is inflammation or swelling of the epididymis. This is caused by an infection. The epididymis is a cord-like structure that is located along the top and back part of the testicle. It collects and stores sperm from the testicle. This condition can also cause pain and swelling of the testicle and scrotum. Symptoms usually start suddenly (acute epididymitis). Sometimes epididymitis starts gradually and lasts for a while (chronic epididymitis). Chronic epididymitis may be harder to treat. What are the causes? In men ages 20-40, this condition is usually caused by a bacterial infection or a sexually transmitted infection (STI), such as gonorrhea or chlamydia. In men 40 and older, this condition is usually caused by bacteria from a urinary blockage or from abnormalities in the urinary system. These can result from: Having a tube placed into the bladder (urinary catheter). Having an enlarged or inflamed prostate gland. Having recently had urinary tract surgery. Having a problem with a backward flow of urine (retrograde). In men who have a condition that weakens the body's defense system (immune system), such as human immunodeficiency virus (HIV), this condition can be caused by: Other bacteria, including tuberculosis and syphilis. Viruses. Fungi. Sometimes this condition occurs without infection. This may happen because of trauma or repetitive activities such as sports. What increases the risk? You are more likely to develop this condition if you have: Unprotected sex with more than one partner. Anal sex. Had recent surgery. A urinary catheter. Urinary problems. A suppressed immune system. What are the signs or symptoms? This condition usually begins suddenly with chills, fever, and pain behind the scrotum and in the testicle. Other symptoms include: Swelling of the scrotum, testicle, or both. Pain when ejaculating or urinating. Pain in the back or  abdomen. Nausea. Itching and discharge from the penis. A frequent need to pass urine. Redness, increased warmth, and tenderness of the scrotum. How is this diagnosed? Your health care provider can diagnose this condition based on your symptoms and medical history. Your health care provider will also do a physical exam to check your scrotum and testicle for swelling, pain, and redness. You may also have other tests, including: Testing of discharge from the penis. Testing your urine for infections, such as STIs. Ultrasound to check for blood flow and inflammation. Your health care provider may test you for other STIs, including HIV. How is this treated? Treatment for this condition depends on the cause. If your condition is caused by a bacterial infection, oral antibiotic medicine may be prescribed. If the bacterial infection has spread to your blood, you may need to receive IV antibiotics. For both bacterial and nonbacterial epididymitis, you may be treated with: Rest. Elevation of the scrotum. Pain medicines. Anti-inflammatory medicines. Surgery may be needed if: You have pus buildup in the scrotum (abscess). You have epididymitis that has not responded to other treatments. Follow these instructions at home: Medicines Take over-the-counter and prescription medicines only as told by your health care provider. If you were prescribed an antibiotic medicine, take it as told by your health care provider. Do not stop taking the antibiotic even if your condition improves. Sexual activity If your epididymitis was caused by an STI, avoid sexual activity until your treatment is complete. Inform your sexual partner or partners if you test positive for an STI. They may need to be treated. Do not engage in sexual activity with your partner or partners until their treatment is completed. Managing pain and swelling  If directed, raise (elevate) your scrotum and apply ice.   To do this: Put ice in a  plastic bag. Place a small towel or pillow between your legs. Rest your scrotum on the pillow or towel. Place another towel between your skin and the plastic bag. Leave the ice on for 20 minutes, 2-3 times a day. Remove the ice if your skin turns bright red. This is very important. If you cannot feel pain, heat, or cold, you have a greater risk of damage to the area. Keep your scrotum elevated and supported while resting. Ask your health care provider if you should wear a scrotal support, such as a jockstrap. Wear it as told by your health care provider. Try taking a sitz bath to help with discomfort. This is a warm water bath that is taken while you are sitting down. The water should come up to your hips and should cover your buttocks. Do this 3-4 times per day or as told by your health care provider. General instructions Drink enough fluid to keep your urine pale yellow. Return to your normal activities as told by your health care provider. Ask your health care provider what activities are safe for you. Keep all follow-up visits. This is important. Contact a health care provider if: You have a fever. Your pain medicine is not helping. Your pain is getting worse. Your symptoms do not improve within 3 days. Summary Epididymitis is inflammation or swelling of the epididymis. This is caused by an infection. This condition can also cause pain and swelling of the testicle and scrotum. Treatment for this condition depends on the cause. If your condition is caused by a bacterial infection, oral antibiotic medicine may be prescribed. Inform your sexual partner or partners if you test positive for an STI. They may need to be treated. Do not engage in sexual activity with your partner or partners until their treatment is completed. Contact a health care provider if your symptoms do not improve within 3 days. This information is not intended to replace advice given to you by your health care provider.  Make sure you discuss any questions you have with your health care provider. Document Revised: 07/12/2021 Document Reviewed: 07/12/2021 Elsevier Patient Education  2023 [employer]  

## 2022-11-21 LAB — URINE CULTURE: Culture: NO GROWTH

## 2022-11-27 LAB — HM DIABETES EYE EXAM

## 2022-11-28 ENCOUNTER — Ambulatory Visit: Payer: Managed Care, Other (non HMO) | Admitting: Dermatology

## 2022-11-28 ENCOUNTER — Encounter: Payer: Self-pay | Admitting: Dermatology

## 2022-11-28 VITALS — BP 135/81 | HR 68

## 2022-11-28 DIAGNOSIS — L578 Other skin changes due to chronic exposure to nonionizing radiation: Secondary | ICD-10-CM

## 2022-11-28 DIAGNOSIS — L821 Other seborrheic keratosis: Secondary | ICD-10-CM

## 2022-11-28 DIAGNOSIS — L820 Inflamed seborrheic keratosis: Secondary | ICD-10-CM | POA: Diagnosis not present

## 2022-11-28 NOTE — Patient Instructions (Signed)
Wound Care Instructions  Cleanse wound gently with soap and water once a day then pat dry with clean gauze. Apply a thin coat of Petrolatum (petroleum jelly, "Vaseline") over the wound (unless you have an allergy to this). We recommend that you use a new, sterile tube of Vaseline. Do not pick or remove scabs. Do not remove the yellow or white "healing tissue" from the base of the wound.  Cover the wound with fresh, clean, nonstick gauze and secure with paper tape. You may use Band-Aids in place of gauze and tape if the wound is small enough, but would recommend trimming much of the tape off as there is often too much. Sometimes Band-Aids can irritate the skin.  You should call the office for your biopsy report after 1 week if you have not already been contacted.  If you experience any problems, such as abnormal amounts of bleeding, swelling, significant bruising, significant pain, or evidence of infection, please call the office immediately.  FOR ADULT SURGERY PATIENTS: If you need something for pain relief you may take 1 extra strength Tylenol (acetaminophen) AND 2 Ibuprofen (200mg each) together every 4 hours as needed for pain. (do not take these if you are allergic to them or if you have a reason you should not take them.) Typically, you may only need pain medication for 1 to 3 days.     Due to recent changes in healthcare laws, you may see results of your pathology and/or laboratory studies on MyChart before the doctors have had a chance to review them. We understand that in some cases there may be results that are confusing or concerning to you. Please understand that not all results are received at the same time and often the doctors may need to interpret multiple results in order to provide you with the best plan of care or course of treatment. Therefore, we ask that you please give us 2 business days to thoroughly review all your results before contacting the office for clarification. Should  we see a critical lab result, you will be contacted sooner.   If You Need Anything After Your Visit  If you have any questions or concerns for your doctor, please call our main line at [phone] and press option 4 to reach your doctor's medical assistant. If no one answers, please leave a voicemail as directed and we will return your call as soon as possible. Messages left after 4 pm will be answered the following business day.   You may also send us a message via MyChart. We typically respond to MyChart messages within 1-2 business days.  For prescription refills, please ask your pharmacy to contact our office. Our fax number is [phone].  If you have an urgent issue when the clinic is closed that cannot wait until the next business day, you can page your doctor at the number below.    Please note that while we do our best to be available for urgent issues outside of office hours, we are not available 24/7.   If you have an urgent issue and are unable to reach us, you may choose to seek medical care at your doctor's office, retail clinic, urgent care center, or emergency room.  If you have a medical emergency, please immediately call 911 or go to the emergency department.  Pager Numbers  - Dr. Kowalski: [phone]  - Dr. Moye: [phone]  - Dr. Stewart: [phone]  In the event of inclement weather, please call our main line at   [phone] for an update on the status of any delays or closures.  Dermatology Medication Tips: Please keep the boxes that topical medications come in in order to help keep track of the instructions about where and how to use these. Pharmacies typically print the medication instructions only on the boxes and not directly on the medication tubes.   If your medication is too expensive, please contact our office at [phone] option 4 or send us a message through MyChart.   We are unable to tell what your co-pay for medications will be in  advance as this is different depending on your insurance coverage. However, we may be able to find a substitute medication at lower cost or fill out paperwork to get insurance to cover a needed medication.   If a prior authorization is required to get your medication covered by your insurance company, please allow us 1-2 business days to complete this process.  Drug prices often vary depending on where the prescription is filled and some pharmacies may offer cheaper prices.  The website www.goodrx.com contains coupons for medications through different pharmacies. The prices here do not account for what the cost may be with help from insurance (it may be cheaper with your insurance), but the website can give you the price if you did not use any insurance.  - You can print the associated coupon and take it with your prescription to the pharmacy.  - You may also stop by our office during regular business hours and pick up a GoodRx coupon card.  - If you need your prescription sent electronically to a different pharmacy, notify our office through [facility] MyChart or by phone at [phone] option 4.     Si Usted Necesita Algo Despus de Su Visita  Tambin puede enviarnos un mensaje a travs de MyChart. Por lo general respondemos a los mensajes de MyChart en el transcurso de 1 a 2 das hbiles.  Para renovar recetas, por favor pida a su farmacia que se ponga en contacto con nuestra oficina. Nuestro nmero de fax es el [phone].  Si tiene un asunto urgente cuando la clnica est cerrada y que no puede esperar hasta el siguiente da hbil, puede llamar/localizar a su doctor(a) al nmero que aparece a continuacin.   Por favor, tenga en cuenta que aunque hacemos todo lo posible para estar disponibles para asuntos urgentes fuera del horario de oficina, no estamos disponibles las 24 horas del da, los 7 das de la semana.   Si tiene un problema urgente y no puede comunicarse con nosotros, puede  optar por buscar atencin mdica  en el consultorio de su doctor(a), en una clnica privada, en un centro de atencin urgente o en una sala de emergencias.  Si tiene una emergencia mdica, por favor llame inmediatamente al 911 o vaya a la sala de emergencias.  Nmeros de bper  - Dr. Kowalski: [phone]  - Dra. Moye: [phone]  - Dra. Stewart: [phone]  En caso de inclemencias del tiempo, por favor llame a nuestra lnea principal al [phone] para una actualizacin sobre el estado de cualquier retraso o cierre.  Consejos para la medicacin en dermatologa: Por favor, guarde las cajas en las que vienen los medicamentos de uso tpico para ayudarle a seguir las instrucciones sobre dnde y cmo usarlos. Las farmacias generalmente imprimen las instrucciones del medicamento slo en las cajas y no directamente en los tubos del medicamento.   Si su medicamento es muy caro, por favor, pngase en contacto con   nuestra oficina llamando al [phone] y presione la opcin 4 o envenos un mensaje a travs de MyChart.   No podemos decirle cul ser su copago por los medicamentos por adelantado ya que esto es diferente dependiendo de la cobertura de su seguro. Sin embargo, es posible que podamos encontrar un medicamento sustituto a menor costo o llenar un formulario para que el seguro cubra el medicamento que se considera necesario.   Si se requiere una autorizacin previa para que su compaa de seguros cubra su medicamento, por favor permtanos de 1 a 2 das hbiles para completar este proceso.  Los precios de los medicamentos varan con frecuencia dependiendo del lugar de dnde se surte la receta y alguna farmacias pueden ofrecer precios ms baratos.  El sitio web www.goodrx.com tiene cupones para medicamentos de diferentes farmacias. Los precios aqu no tienen en cuenta lo que podra costar con la ayuda del seguro (puede ser ms barato con su seguro), pero el sitio web puede darle el  precio si no utiliz ningn seguro.  - Puede imprimir el cupn correspondiente y llevarlo con su receta a la farmacia.  - Tambin puede pasar por nuestra oficina durante el horario de atencin regular y recoger una tarjeta de cupones de GoodRx.  - Si necesita que su receta se enve electrnicamente a una farmacia diferente, informe a nuestra oficina a travs de MyChart de [facility] o por telfono llamando al [phone] y presione la opcin 4.  

## 2022-11-28 NOTE — Progress Notes (Signed)
   Follow-Up Visit   Subjective  Fernando Hart is a 71 y.o. male who presents for the following: Seborrheic Keratosis (Here for treatment of more ISKs on neck). The patient has spots, moles and lesions to be evaluated, some may be new or changing and the patient has concerns that these could be cancer.  The following portions of the chart were reviewed this encounter and updated as appropriate:  Tobacco  Allergies  Meds  Problems  Med Hx  Surg Hx  Fam Hx     Review of Systems: No other skin or systemic complaints except as noted in HPI or Assessment and Plan.  Objective  Well appearing patient in no apparent distress; mood and affect are within normal limits.  A focused examination was performed including face, neck. Relevant physical exam findings are noted in the Assessment and Plan.  Left neck x17 (17) Erythematous keratotic or waxy stuck-on papule or plaque.   Assessment & Plan  Inflamed seborrheic keratosis (17) Left neck x17 Symptomatic, irritating, patient would like treated. Destruction of lesion - Left neck x17 Complexity: simple   Destruction method comment:  Snip excision Informed consent: discussed and consent obtained   Anesthesia: the lesion was anesthetized in a standard fashion   Anesthetic:  1% lidocaine w/ epinephrine 1-100,000 local infiltration Hemostasis achieved with:  aluminum chloride and electrodesiccation Outcome: patient tolerated procedure well with no complications   Post-procedure details: wound care instructions given    Seborrheic Keratoses - Stuck-on, waxy, tan-brown papules and/or plaques  - Benign-appearing - Discussed benign etiology and prognosis. - Observe - Call for any changes  Actinic Damage - chronic, secondary to cumulative UV radiation exposure/sun exposure over time - diffuse scaly erythematous macules with underlying dyspigmentation - Recommend daily broad spectrum sunscreen SPF 30+ to sun-exposed areas, reapply every 2  hours as needed.  - Recommend staying in the shade or wearing long sleeves, sun glasses (UVA+UVB protection) and wide brim hats (4-inch brim around the entire circumference of the hat). - Call for new or changing lesions.  Return for ISK Follow Up/snip excision neck 2-3 months.  I, Emelia Salisbury, CMA, am acting as scribe for Sarina Ser, MD. Documentation: I have reviewed the above documentation for accuracy and completeness, and I agree with the above.  Sarina Ser, MD

## 2022-12-13 ENCOUNTER — Encounter: Payer: Self-pay | Admitting: Dermatology

## 2022-12-24 NOTE — Progress Notes (Signed)
Name: Fernando Hart   MRN: [id number]    DOB: 03-Nov-1951   Date:12/25/2022       Progress Note  Subjective  Chief Complaint  Follow Up  HPI  HTN: he is compliant with bp medication, he denies chest pain , palpitation or dizziness. BP was at goal on Lotrel but it caused lower extremity edema, we changed to valsartan 269 mg and bystolic 5 mg but bp at home has been between 135-150's he is currently taking bystolic 20 mg daily and bp is still elevated in the 140/150's 72-85. No side effects of medications, we will adjust dose to 40 mg . Goal is bp below 130/80. 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 Reminded him of anaphylactic reaction   Obesity: he gained 8 lbs during the holidays , he states noticed some tingling on legs and hands when he ate a lot of sweets, explained it could be another form of allergy . Discussed life style modification   Patient Active Problem List   Diagnosis Date Noted   Stage 3a chronic kidney disease ([place]) 11/04/2020   Vitamin D deficiency 11/04/2020   Intermittent low back pain 03/24/2018   Tinnitus of right ear 07/12/2017   Dyslipidemia 10/11/2016   Obesity 10/11/2016   Degenerative joint disease of right hip 02/28/2016   Prediabetes 01/10/2016   Controlled gout 01/10/2016   Essential hypertension 01/10/2016    Past Surgical History:  Procedure Laterality Date   ROOT CANAL      Family History  Problem Relation Age of Onset   Hypertension Mother    Diabetes Father    Diabetes Sister    GI Bleed Daughter     Social History   Tobacco Use   Smoking status: Former    Packs/day: 1.00    Years: 35.00    Total pack years: 35.00    Types: Cigarettes    Quit date: 07/2004    Years since quitting: 18.4   Smokeless tobacco: Never  Substance Use Topics   Alcohol use: Yes    Alcohol/week: 1.0 standard drink of  alcohol    Types: 1 Shots of liquor per week    Comment: not every week     Current Outpatient Medications:    colchicine 0.6 MG tablet, Take 1 tablet (0.6 mg total) by mouth daily. May repeat in 1 hour if no improvement no more than 2 in 24 hours, Disp: 30 tablet, Rfl: 0   diclofenac Sodium (VOLTAREN) 1 % GEL, APPLY 2 GRAMS TOPICALLY TO AFFECTED AREA FOUR TIMES DAILY, Disp: 300 g, Rfl: 1   ezetimibe (ZETIA) 10 MG tablet, Take 1 tablet (10 mg total) by mouth daily., Disp: 90 tablet, Rfl: 3   nebivolol (BYSTOLIC) 10 MG tablet, Take 1 tablet (10 mg total) by mouth daily. (Patient taking differently: Take 20 mg by mouth daily.), Disp: 90 tablet, Rfl: 0   valsartan (DIOVAN) 320 MG tablet, Take 1 tablet (320 mg total) by mouth daily., Disp: 90 tablet, Rfl: 0   celecoxib (CELEBREX) 200 MG capsule, Take 1 capsule (200 mg total) by mouth daily after breakfast. (Patient not taking: Reported on 12/25/2022), Disp: 30 capsule, Rfl: 0  Allergies  Allergen Reactions   Norvasc [Amlodipine] Swelling    I personally reviewed active problem list, medication list, allergies, family history, social history, health maintenance with the patient/caregiver today.  ROS  Ten systems reviewed and is negative except as mentioned in HPI   Objective  Vitals:   12/25/22 0953 12/25/22 1005  BP: (!) 156/82 (!) 148/78  Pulse: 80   Resp: 16   SpO2: 95%   Weight: 208 lb (94.3 kg)   Height: '5\' 7"'$  (1.702 m)     Body mass index is 32.58 kg/m.  Physical Exam  Constitutional: Patient appears well-developed and well-nourished. Obese  No distress.  HEENT: head atraumatic, normocephalic, pupils equal and reactive to light, neck supple Cardiovascular: Normal rate, regular rhythm and normal heart sounds.  No murmur heard. No BLE edema. Pulmonary/Chest: Effort normal and breath sounds normal. No respiratory distress. Abdominal: Soft.  There is no tenderness. Psychiatric: Patient has a normal mood and affect.  behavior is normal. Judgment and thought content normal.   Recent Results (from the past 2160 hour(s))  Urine Culture     Status: None   Collection Time: 11/20/22 10:42 AM   Specimen: Urine, Clean Catch  Result Value Ref Range   Specimen Description      URINE, CLEAN CATCH Performed at [facility], [address], [place], [place] [address]    Special Requests      NONE Performed at [facility] [place], [address], [place], [place] [address]    Culture      NO GROWTH Performed at [facility], [place] [address] [place], [place] [address]    Report Status 11/21/2022 FINAL   Urinalysis, Complete w Microscopic     Status: Abnormal   Collection Time: 11/20/22 10:42 AM  Result Value Ref Range   Color, Urine YELLOW YELLOW   APPearance CLEAR CLEAR   Specific Gravity, Urine 1.020 1.005 - 1.030   pH 5.5 5.0 - 8.0   Glucose, UA NEGATIVE NEGATIVE mg/dL   Hgb urine dipstick NEGATIVE NEGATIVE   Bilirubin Urine NEGATIVE NEGATIVE   Ketones, ur NEGATIVE NEGATIVE mg/dL   Protein, ur NEGATIVE NEGATIVE mg/dL   Nitrite NEGATIVE NEGATIVE   Leukocytes,Ua NEGATIVE NEGATIVE   Squamous Epithelial / HPF NONE SEEN 0 - 5   WBC, UA 0-5 0 - 5 WBC/hpf   RBC / HPF 0-5 0 - 5 RBC/hpf   Bacteria, UA RARE (A) NONE SEEN   WBC Casts, UA PRESENT     Comment: Performed at [facility] [place], [address] [place],  [address]  HM DIABETES EYE EXAM     Status: None   Collection Time: 11/27/22 12:00 AM  Result Value Ref Range   HM Diabetic Eye Exam No Retinopathy No Retinopathy    PHQ2/9:    12/25/2022    9:52 AM 11/06/2022   10:20 AM 08/08/2022    8:27 AM 11/07/2021    7:31 AM 05/04/2021    7:31 AM  Depression screen PHQ 2/9  Decreased Interest 0 0 0 0 0  Down, Depressed, Hopeless 0 0 0 0 0  PHQ - 2 Score 0 0 0 0 0  Altered sleeping 0 0 0 0   Tired, decreased energy 0 0 0 0   Change in appetite 0 0 0 0   Feeling bad or failure about yourself  0 0 0 0    Trouble concentrating 0 0 0 0   Moving slowly or fidgety/restless 0 0 0 0   Suicidal thoughts 0 0 0 0   PHQ-9 Score 0 0 0 0     phq 9 is negative   Fall  Risk:    12/25/2022    9:52 AM 11/06/2022   10:20 AM 08/08/2022    8:27 AM 11/07/2021    7:31 AM 05/04/2021    7:30 AM  Fall Risk   Falls in the past year? 0 0 0 0 0  Number falls in past yr: 0 0 0 0 0  Injury with Fall? 0 0 0 0 0  Risk for fall due to : No Fall Risks No Fall Risks No Fall Risks No Fall Risks   Follow up Falls prevention discussed Falls prevention discussed Falls prevention discussed Falls prevention discussed       Functional Status Survey: Is the patient deaf or have difficulty hearing?: No Does the patient have difficulty seeing, even when wearing glasses/contacts?: No Does the patient have difficulty concentrating, remembering, or making decisions?: No Does the patient have difficulty walking or climbing stairs?: No Does the patient have difficulty dressing or bathing?: No Does the patient have difficulty doing errands alone such as visiting a doctor's office or shopping?: No    Assessment & Plan  1. Essential hypertension  - Nebivolol HCl (BYSTOLIC) 20 MG TABS; Take 1 tablet (20 mg total) by mouth 2 (two) times daily.  Dispense: 180 tablet; Refill: 0 Keep monitoring bp and recheck during CPE   2. Shrimp allergy  - EPINEPHrine 0.3 mg/0.3 mL IJ SOAJ injection; Inject 0.3 mg into the muscle as needed for anaphylaxis.  Dispense: 1 each; Refill: 0  3. Obesity (BMI 30.0-34.9)  Discussed with the patient the risk posed by an increased BMI. Discussed importance of portion control, calorie counting and at least 150 minutes of physical activity weekly. Avoid sweet beverages and drink more water. Eat at least 6 servings of fruit and vegetables daily

## 2022-12-25 ENCOUNTER — Encounter: Payer: Self-pay | Admitting: Family Medicine

## 2022-12-25 ENCOUNTER — Ambulatory Visit: Payer: Managed Care, Other (non HMO) | Admitting: Family Medicine

## 2022-12-25 VITALS — BP 148/78 | HR 80 | Resp 16 | Ht 67.0 in | Wt 208.0 lb

## 2022-12-25 DIAGNOSIS — E66811 Obesity, class 1: Secondary | ICD-10-CM

## 2022-12-25 DIAGNOSIS — E669 Obesity, unspecified: Secondary | ICD-10-CM

## 2022-12-25 DIAGNOSIS — Z91013 Allergy to seafood: Secondary | ICD-10-CM

## 2022-12-25 DIAGNOSIS — I10 Essential (primary) hypertension: Secondary | ICD-10-CM

## 2022-12-25 MED ORDER — EPINEPHRINE 0.3 MG/0.3ML IJ SOAJ: 0.3000 mg | INTRAMUSCULAR | 0 refills | Status: AC | PRN

## 2022-12-25 MED ORDER — VALSARTAN 320 MG PO TABS: 320.0000 mg | ORAL_TABLET | Freq: Every day | ORAL | 0 refills | Status: DC

## 2022-12-25 MED ORDER — NEBIVOLOL HCL 20 MG PO TABS: 20.0000 mg | ORAL_TABLET | Freq: Two times a day (BID) | ORAL | 0 refills | Status: DC

## 2022-12-29 LAB — CBC WITH DIFFERENTIAL/PLATELET
Basophils Absolute: 0.1 10*3/uL (ref 0.0–0.2)
Basos: 1 %
EOS (ABSOLUTE): 0.4 10*3/uL (ref 0.0–0.4)
Eos: 9 %
Hematocrit: 44.8 % (ref 37.5–51.0)
Hemoglobin: 15.2 g/dL (ref 13.0–17.7)
Immature Grans (Abs): 0 10*3/uL (ref 0.0–0.1)
Immature Granulocytes: 0 %
Lymphocytes Absolute: 1.9 10*3/uL (ref 0.7–3.1)
Lymphs: 39 %
MCH: 28.6 pg (ref 26.6–33.0)
MCHC: 33.9 g/dL (ref 31.5–35.7)
MCV: 84 fL (ref 79–97)
Monocytes Absolute: 0.6 10*3/uL (ref 0.1–0.9)
Monocytes: 12 %
Neutrophils Absolute: 1.9 10*3/uL (ref 1.4–7.0)
Neutrophils: 39 %
Platelets: 192 10*3/uL (ref 150–450)
RBC: 5.32 x10E6/uL (ref 4.14–5.80)
RDW: 12.3 % (ref 11.6–15.4)
WBC: 4.9 10*3/uL (ref 3.4–10.8)

## 2022-12-29 LAB — COMPREHENSIVE METABOLIC PANEL
ALT: 27 IU/L (ref 0–44)
AST: 27 IU/L (ref 0–40)
Albumin/Globulin Ratio: 1.6 (ref 1.2–2.2)
Albumin: 3.9 g/dL (ref 3.8–4.8)
Alkaline Phosphatase: 84 IU/L (ref 44–121)
BUN/Creatinine Ratio: 10 (ref 10–24)
BUN: 12 mg/dL (ref 8–27)
Bilirubin Total: 0.6 mg/dL (ref 0.0–1.2)
CO2: 23 mmol/L (ref 20–29)
Calcium: 9.7 mg/dL (ref 8.6–10.2)
Chloride: 104 mmol/L (ref 96–106)
Creatinine, Ser: 1.24 mg/dL (ref 0.76–1.27)
Globulin, Total: 2.5 g/dL (ref 1.5–4.5)
Glucose: 108 mg/dL — ABNORMAL HIGH (ref 70–99)
Potassium: 4.2 mmol/L (ref 3.5–5.2)
Sodium: 142 mmol/L (ref 134–144)
Total Protein: 6.4 g/dL (ref 6.0–8.5)
eGFR: 62 mL/min/{1.73_m2} (ref >59)

## 2022-12-29 LAB — LIPID PANEL
Chol/HDL Ratio: 4.3 ratio (ref 0.0–5.0)
Cholesterol, Total: 137 mg/dL (ref 100–199)
HDL: 32 mg/dL — ABNORMAL LOW (ref >39)
LDL Chol Calc (NIH): 84 mg/dL (ref 0–99)
Triglycerides: 116 mg/dL (ref 0–149)
VLDL Cholesterol Cal: 21 mg/dL (ref 5–40)

## 2022-12-29 LAB — HEMOGLOBIN A1C
Est. average glucose Bld gHb Est-mCnc: 131 mg/dL
Hgb A1c MFr Bld: 6.2 % — ABNORMAL HIGH (ref 4.8–5.6)

## 2022-12-29 LAB — VITAMIN B12: Vitamin B-12: 413 pg/mL (ref 232–1245)

## 2022-12-29 LAB — VITAMIN D 25 HYDROXY (VIT D DEFICIENCY, FRACTURES): Vit D, 25-Hydroxy: 14.6 ng/mL — ABNORMAL LOW (ref 30.0–100.0)

## 2022-12-29 LAB — URIC ACID: Uric Acid: 6.3 mg/dL (ref 3.8–8.4)

## 2022-12-31 ENCOUNTER — Other Ambulatory Visit: Payer: Self-pay | Admitting: Family Medicine

## 2022-12-31 DIAGNOSIS — E559 Vitamin D deficiency, unspecified: Secondary | ICD-10-CM

## 2022-12-31 MED ORDER — VITAMIN D (ERGOCALCIFEROL) 1.25 MG (50000 UNIT) PO CAPS: 50000.0000 [IU] | ORAL_CAPSULE | ORAL | 1 refills | Status: DC

## 2023-02-04 ENCOUNTER — Ambulatory Visit: Payer: Managed Care, Other (non HMO) | Admitting: Dermatology

## 2023-02-05 NOTE — Patient Instructions (Incomplete)
[place] 89 Years and Older, Male Preventive care refers to lifestyle choices and visits with your health care provider that can promote health and wellness. Preventive care visits are also called wellness exams. What can I expect for my preventive care visit? Counseling During your preventive care visit, your health care provider may ask about your: Medical history, including: Past medical problems. Family medical history. History of falls. Current health, including: Emotional well-being. Home life and relationship well-being. Sexual activity. Memory and ability to understand (cognition). Lifestyle, including: Alcohol, nicotine or tobacco, and drug use. Access to firearms. Diet, exercise, and sleep habits. Work and work Statistician. Sunscreen use. Safety issues such as seatbelt and bike helmet use. Physical exam Your health care provider will check your: Height and weight. These may be used to calculate your BMI (body mass index). BMI is a measurement that tells if you are at a healthy weight. Waist circumference. This measures the distance around your waistline. This measurement also tells if you are at a healthy weight and may help predict your risk of certain diseases, such as type 2 diabetes and high blood pressure. Heart rate and blood pressure. Body temperature. Skin for abnormal spots. What immunizations do I need?  Vaccines are usually given at various ages, according to a schedule. Your health care provider will recommend vaccines for you based on your age, medical history, and lifestyle or other factors, such as travel or where you work. What tests do I need? Screening Your health care provider may recommend screening tests for certain conditions. This may include: Lipid and cholesterol levels. Diabetes screening. This is done by checking your blood sugar (glucose) after you have not eaten for a while (fasting). Hepatitis C test. Hepatitis B  test. HIV (human immunodeficiency virus) test. STI (sexually transmitted infection) testing, if you are at risk. Lung cancer screening. Colorectal cancer screening. Prostate cancer screening. Abdominal aortic aneurysm (AAA) screening. You may need this if you are a current or former smoker. Talk with your health care provider about your test results, treatment options, and if necessary, the need for more tests. Follow these instructions at home: Eating and drinking  Eat a diet that includes fresh fruits and vegetables, whole grains, lean protein, and low-fat dairy products. Limit your intake of foods with high amounts of sugar, saturated fats, and salt. Take vitamin and mineral supplements as recommended by your health care provider. Do not drink alcohol if your health care provider tells you not to drink. If you drink alcohol: Limit how much you have to 0-2 drinks a day. Know how much alcohol is in your drink. In the [place] one drink equals one 12 oz bottle of beer (355 mL), one 5 oz glass of wine (148 mL), or one 1 oz glass of hard liquor (44 mL). Lifestyle Brush your teeth every morning and night with fluoride toothpaste. Floss one time each day. Exercise for at least 30 minutes 5 or more days each week. Do not use any products that contain nicotine or tobacco. These products include cigarettes, chewing tobacco, and vaping devices, such as e-cigarettes. If you need help quitting, ask your health care provider. Do not use drugs. If you are sexually active, practice safe sex. Use a condom or other form of protection to prevent STIs. Take aspirin only as told by your health care provider. Make sure that you understand how much to take and what form to take. Work with your health care provider to find out whether  it is safe and beneficial for you to take aspirin daily. Ask your health care provider if you need to take a cholesterol-lowering medicine (statin). Find healthy ways to manage  stress, such as: Meditation, yoga, or listening to music. Journaling. Talking to a trusted person. Spending time with friends and family. Safety Always wear your seat belt while driving or riding in a vehicle. Do not drive: If you have been drinking alcohol. Do not ride with someone who has been drinking. When you are tired or distracted. While texting. If you have been using any mind-altering substances or drugs. Wear a helmet and other protective equipment during sports activities. If you have firearms in your house, make sure you follow all gun safety procedures. Minimize exposure to UV radiation to reduce your risk of skin cancer. What's next? Visit your health care provider once a year for an annual wellness visit. Ask your health care provider how often you should have your eyes and teeth checked. Stay up to date on all vaccines. This information is not intended to replace advice given to you by your health care provider. Make sure you discuss any questions you have with your health care provider. Document Revised: 05/31/2021 Document Reviewed: 05/31/2021 Elsevier Patient Education  [employer].

## 2023-02-05 NOTE — Progress Notes (Unsigned)
Name: Fernando Hart   MRN: [id number]    DOB: 08-Dec-1951   Date:02/06/2023       Progress Note  Subjective  Chief Complaint  Annual Exam  HPI  Patient presents for annual CPE.  IPSS Questionnaire (AUA-7): Over the past month.   1)  How often have you had a sensation of not emptying your bladder completely after you finish urinating?  1 - Less than 1 time in 5  2)  How often have you had to urinate again less than two hours after you finished urinating? 0 - Not at all  3)  How often have you found you stopped and started again several times when you urinated?  1 - Less than 1 time in 5  4) How difficult have you found it to postpone urination?  0 - Not at all  5) How often have you had a weak urinary stream?  1 - Less than 1 time in 5  6) How often have you had to push or strain to begin urination?  0 - Not at all  7) How many times did you most typically get up to urinate from the time you went to bed until the time you got up in the morning?  2 - 2 times  Total score:  0-7 mildly symptomatic   8-19 moderately symptomatic   20-35 severely symptomatic     Diet: he started ordering just meats online and eats it with frozen vegetables, he also likes rice  Exercise: he has a stationary bike and rides it for 10 minutes  Last Dental Exam: scheduled for tomorrow, he goes every 6 months  Last Eye Exam: Dec 2023   Depression: phq 9 is negative    02/06/2023   10:18 AM 12/25/2022    9:52 AM 11/06/2022   10:20 AM 08/08/2022    8:27 AM 11/07/2021    7:31 AM  Depression screen PHQ 2/9  Decreased Interest 0 0 0 0 0  Down, Depressed, Hopeless 0 0 0 0 0  PHQ - 2 Score 0 0 0 0 0  Altered sleeping 0 0 0 0 0  Tired, decreased energy 0 0 0 0 0  Change in appetite 0 0 0 0 0  Feeling bad or failure about yourself  0 0 0 0 0  Trouble concentrating 0 0 0 0 0  Moving slowly or fidgety/restless 0 0 0 0 0  Suicidal thoughts 0 0 0 0 0  PHQ-9 Score 0 0 0 0 0    Hypertension:  BP Readings from  Last 3 Encounters:  02/06/23 (!) 146/88  12/25/22 (!) 148/78  11/28/22 135/81    Obesity: Wt Readings from Last 3 Encounters:  02/06/23 207 lb 1.6 oz (93.9 kg)  12/25/22 208 lb (94.3 kg)  11/20/22 200 lb (90.7 kg)   BMI Readings from Last 3 Encounters:  02/06/23 32.44 kg/m  12/25/22 32.58 kg/m  11/20/22 31.32 kg/m     Lipids:  Lab Results  Component Value Date   CHOL 137 12/28/2022   CHOL 150 07/03/2022   CHOL 147 01/01/2022   Lab Results  Component Value Date   HDL 32 (L) 12/28/2022   HDL 33 (A) 07/03/2022   HDL 36 (L) 01/01/2022   Lab Results  Component Value Date   LDLCALC 84 12/28/2022   LDLCALC 103 07/03/2022   LDLCALC 92 01/01/2022   Lab Results  Component Value Date   TRIG 116 12/28/2022   TRIG 68 07/03/2022  TRIG 101 01/01/2022   Lab Results  Component Value Date   CHOLHDL 4.3 12/28/2022   CHOLHDL 4.1 01/01/2022   CHOLHDL 4.6 10/13/2020   No results found for: "LDLDIRECT" Glucose:  Glucose  Date Value Ref Range Status  12/28/2022 108 (H) 70 - 99 mg/dL Final  01/01/2022 126 (H) 70 - 99 mg/dL Final  11/29/2020 90 65 - 99 mg/dL Final    Flowsheet Row Office Visit from [facility]/07/2021 in [facility]  AUDIT-C Score 1       Divorced STD testing and prevention (HIV/chl/gon/syphilis): N/A Sexual history: one partner, they have been together for the past 30 years  Hep C Screening: 03/13/17 Skin cancer: Discussed monitoring for atypical lesions Colorectal cancer: FOBT 01/20/22 due for repeat now  Prostate cancer:  discussed USPTF   Lung cancer:  Low Dose CT Chest recommended if Age 11-80 years, 30 pack-year currently smoking OR have quit w/in 15years. Patient  no a candidate for screening   AAA: The USPSTF recommends one-time screening with ultrasonography in men ages 1 to 64 years who have ever smoked. Patient   no, a candidate for screening  ECG:  05/04/20  Vaccines:   HPV: N/A Tdap: up to date Shingrix: up to  date Pneumonia: up to date Flu: up to date COVID-26: up to date  Advanced Care Planning: A voluntary discussion about advance care planning including the explanation and discussion of advance directives.  Discussed health care proxy and Living will, and the patient was able to identify a health care proxy as girlfriend - she lives in [place].  Patient does not have a living will and power of attorney of health care   Patient Active Problem List   Diagnosis Date Noted   Stage 3a chronic kidney disease ([place]) 11/04/2020   Vitamin D deficiency 11/04/2020   Intermittent low back pain 03/24/2018   Tinnitus of right ear 07/12/2017   Dyslipidemia 10/11/2016   Obesity 10/11/2016   Degenerative joint disease of right hip 02/28/2016   Prediabetes 01/10/2016   Controlled gout 01/10/2016   Essential hypertension 01/10/2016    Past Surgical History:  Procedure Laterality Date   ROOT CANAL      Family History  Problem Relation Age of Onset   Hypertension Mother    Diabetes Father    Diabetes Sister    GI Bleed Daughter     Social History   Socioeconomic History   Marital status: Divorced    Spouse name: Not on file   Number of children: 3   Years of education: Not on file   Highest education level: Associate degree: occupational, Hotel manager, or vocational program  Occupational History   Occupation: IT   Tobacco Use   Smoking status: Former    Packs/day: 1.00    Years: 35.00    Total pack years: 35.00    Types: Cigarettes    Quit date: 07/2004    Years since quitting: 18.5   Smokeless tobacco: Never  Vaping Use   Vaping Use: Never used  Substance and Sexual Activity   Alcohol use: Yes    Alcohol/week: 1.0 standard drink of alcohol    Types: 1 Shots of liquor per week    Comment: not every week   Drug use: Yes   Sexual activity: Yes    Partners: Female    Comment: partner is post-menopausal  Other Topics Concern   Not on file  Social History Narrative   Divorced many years  ago, dating  same person for 25 years she lives in [place] but they thinking about getting married   He had  3 grown children, youngest daughter died suddenly at age 78 from unknown at her gym .    8 grandchildren.    Two daughters living in [place]   Social Determinants of Health   Financial Resource Strain: Low Risk  (02/06/2023)   Overall Financial Resource Strain (CARDIA)    Difficulty of Paying Living Expenses: Not hard at all  Food Insecurity: No Food Insecurity (02/06/2023)   Hunger Vital Sign    Worried About Running Out of Food in the Last Year: Never true    Ran Out of Food in the Last Year: Never true  Transportation Needs: No Transportation Needs (02/06/2023)   PRAPARE - Hydrologist (Medical): No    Lack of Transportation (Non-Medical): No  Physical Activity: Insufficiently Active (02/06/2023)   Exercise Vital Sign    Days of Exercise per Week: 3 days    Minutes of Exercise per Session: 20 min  Stress: No Stress Concern Present (02/06/2023)   [place]    Feeling of Stress : Not at all  Social Connections: Socially Isolated (02/06/2023)   Social Connection and Isolation Panel [NHANES]    Frequency of Communication with Friends and Family: More than three times a week    Frequency of Social Gatherings with Friends and Family: Once a week    Attends Religious Services: Never    Marine scientist or Organizations: No    Attends Archivist Meetings: Never    Marital Status: Divorced  Human resources officer Violence: Not At Risk (02/06/2023)   Humiliation, Afraid, Rape, and Kick questionnaire    Fear of Current or Ex-Partner: No    Emotionally Abused: No    Physically Abused: No    Sexually Abused: No     Current Outpatient Medications:    colchicine 0.6 MG tablet, Take 1 tablet (0.6 mg total) by mouth daily. May repeat in 1 hour if no improvement no more than 2 in 24 hours, Disp:  30 tablet, Rfl: 0   diclofenac Sodium (VOLTAREN) 1 % GEL, APPLY 2 GRAMS TOPICALLY TO AFFECTED AREA FOUR TIMES DAILY, Disp: 300 g, Rfl: 1   EPINEPHrine 0.3 mg/0.3 mL IJ SOAJ injection, Inject 0.3 mg into the muscle as needed for anaphylaxis., Disp: 1 each, Rfl: 0   ezetimibe (ZETIA) 10 MG tablet, Take 1 tablet (10 mg total) by mouth daily., Disp: 90 tablet, Rfl: 3   Nebivolol HCl (BYSTOLIC) 20 MG TABS, Take 1 tablet (20 mg total) by mouth 2 (two) times daily., Disp: 180 tablet, Rfl: 0   valsartan (DIOVAN) 320 MG tablet, Take 1 tablet (320 mg total) by mouth daily., Disp: 90 tablet, Rfl: 0   Vitamin D, Ergocalciferol, (DRISDOL) 1.25 MG (50000 UNIT) CAPS capsule, Take 1 capsule (50,000 Units total) by mouth every 7 (seven) days., Disp: 12 capsule, Rfl: 1  Allergies  Allergen Reactions   Norvasc [Amlodipine] Swelling   Shrimp (Diagnostic)      ROS  Constitutional: Negative for fever or weight change.  Respiratory: Negative for cough and shortness of breath.   Cardiovascular: Negative for chest pain or palpitations.  Gastrointestinal: Negative for abdominal pain, no bowel changes.  Musculoskeletal: positive for gait problem - slow  Skin: Negative for rash.  Neurological: Negative for dizziness or headache.  No other specific complaints in a complete review  of systems (except as listed in HPI above).    Objective  Vitals:   02/06/23 1014  BP: (!) 146/88  Pulse: 73  Resp: 16  Temp: 97.8 F (36.6 C)  TempSrc: Oral  SpO2: 94%  Weight: 207 lb 1.6 oz (93.9 kg)  Height: 5' 7"$  (1.702 m)    Body mass index is 32.44 kg/m.  Physical Exam  Constitutional: Patient appears well-developed and well-nourished. Obesity No distress.  HENT: Head: Normocephalic and atraumatic. Ears: B TMs ok, no erythema or effusion; Nose: Nose normal. Mouth/Throat: Oropharynx is clear and moist. No oropharyngeal exudate.  Eyes: Conjunctivae and EOM are normal. Pupils are equal, round, and reactive to light.  No scleral icterus.  Neck: Normal range of motion. Neck supple. No JVD present. No thyromegaly present.  Cardiovascular: Normal rate, regular rhythm and normal heart sounds.  No murmur heard. No BLE edema. Pulmonary/Chest: Effort normal and breath sounds normal. No respiratory distress. Abdominal: Soft. Bowel sounds are normal, no distension. There is no tenderness. no masses MALE GENITALIA: Normal descended testes bilaterally, no masses palpated, no hernias, no lesions, no discharge RECTAL: Prostate normal size and consistency, no rectal masses or hemorrhoids  Musculoskeletal: decrease rom of spine and hip bilateral, he seems to favor his right leg but denies any pain Neurological: he is alert and oriented to person, place, and time. No cranial nerve deficit. Coordination, balance, strength, speech and gait are normal.  Skin: Skin is warm and dry. dry skin on back  Psychiatric: Patient has a normal mood and affect. behavior is normal. Judgment and thought content normal.   Recent Results (from the past 2160 hour(s))  Urine Culture     Status: None   Collection Time: 11/20/22 10:42 AM   Specimen: Urine, Clean Catch  Result Value Ref Range   Specimen Description      URINE, CLEAN CATCH Performed at [facility], [address], [place], [place] [address]    Special Requests      NONE Performed at [facility] [place], [address], [place], [place] [address]    Culture      NO GROWTH Performed at [facility], [place] [address] [place], [place] [address]    Report Status 11/21/2022 FINAL   Urinalysis, Complete w Microscopic     Status: Abnormal   Collection Time: 11/20/22 10:42 AM  Result Value Ref Range   Color, Urine YELLOW YELLOW   APPearance CLEAR CLEAR   Specific Gravity, Urine 1.020 1.005 - 1.030   pH 5.5 5.0 - 8.0   Glucose, UA NEGATIVE NEGATIVE mg/dL   Hgb urine dipstick NEGATIVE NEGATIVE   Bilirubin Urine NEGATIVE NEGATIVE   Ketones, ur NEGATIVE  NEGATIVE mg/dL   Protein, ur NEGATIVE NEGATIVE mg/dL   Nitrite NEGATIVE NEGATIVE   Leukocytes,Ua NEGATIVE NEGATIVE   Squamous Epithelial / HPF NONE SEEN 0 - 5   WBC, UA 0-5 0 - 5 WBC/hpf   RBC / HPF 0-5 0 - 5 RBC/hpf   Bacteria, UA RARE (A) NONE SEEN   WBC Casts, UA PRESENT     Comment: Performed at [facility], [address], [place], [place] [address]  HM DIABETES EYE EXAM     Status: None   Collection Time: 11/27/22 12:00 AM  Result Value Ref Range   HM Diabetic Eye Exam No Retinopathy No Retinopathy  Lipid panel     Status: Abnormal   Collection Time: 12/28/22 11:59 AM  Result Value Ref  Range   Cholesterol, Total 137 100 - 199 mg/dL   Triglycerides 116 0 - 149 mg/dL   HDL 32 (L) >39 mg/dL   VLDL Cholesterol Cal 21 5 - 40 mg/dL   LDL Chol Calc (NIH) 84 0 - 99 mg/dL   Chol/HDL Ratio 4.3 0.0 - 5.0 ratio    Comment:                                   T. Chol/HDL Ratio                                             Men  Women                               1/2 Avg.Risk  3.4    3.3                                   Avg.Risk  5.0    4.4                                2X Avg.Risk  9.6    7.1                                3X Avg.Risk 23.4   11.0   Vitamin B12     Status: None   Collection Time: 12/28/22 11:59 AM  Result Value Ref Range   Vitamin B-12 413 232 - 1,245 pg/mL  VITAMIN D 25 Hydroxy (Vit-D Deficiency, Fractures)     Status: Abnormal   Collection Time: 12/28/22 11:59 AM  Result Value Ref Range   Vit D, 25-Hydroxy 14.6 (L) 30.0 - 100.0 ng/mL    Comment: Vitamin D deficiency has been defined by the [place] practice guideline as a level of serum 25-OH vitamin D less than 20 ng/mL (1,2). The Endocrine Society went on to further define vitamin D insufficiency as a level between 21 and 29 ng/mL (2). 1. IOM (Institute of Medicine). 2010. Dietary reference    intakes for calcium and D. [place]: The    [employer]. 2. Holick MF, Binkley [place], Bischoff-Ferrari HA, et al.    Evaluation, treatment, and prevention of vitamin D    deficiency: an Endocrine Society clinical practice    guideline. JCEM. 2011 Jul; 96(7):1911-30.   Hemoglobin A1c     Status: Abnormal   Collection Time: 12/28/22 11:59 AM  Result Value Ref Range   Hgb A1c MFr Bld 6.2 (H) 4.8 - 5.6 %    Comment:          Prediabetes: 5.7 - 6.4          Diabetes: >6.4          Glycemic control for adults with diabetes: <7.0    Est. average glucose Bld gHb Est-mCnc 131 mg/dL  Comprehensive metabolic panel     Status: Abnormal   Collection Time: 12/28/22 11:59 AM  Result Value Ref Range   Glucose 108 (H) 70 -  99 mg/dL   BUN 12 8 - 27 mg/dL   Creatinine, Ser 1.24 0.76 - 1.27 mg/dL   eGFR 62 >59 mL/min/1.73   BUN/Creatinine Ratio 10 10 - 24   Sodium 142 134 - 144 mmol/L   Potassium 4.2 3.5 - 5.2 mmol/L   Chloride 104 96 - 106 mmol/L   CO2 23 20 - 29 mmol/L   Calcium 9.7 8.6 - 10.2 mg/dL   Total Protein 6.4 6.0 - 8.5 g/dL   Albumin 3.9 3.8 - 4.8 g/dL   Globulin, Total 2.5 1.5 - 4.5 g/dL   Albumin/Globulin Ratio 1.6 1.2 - 2.2   Bilirubin Total 0.6 0.0 - 1.2 mg/dL   Alkaline Phosphatase 84 44 - 121 IU/L   AST 27 0 - 40 IU/L   ALT 27 0 - 44 IU/L  CBC with Differential/Platelet     Status: None   Collection Time: 12/28/22 11:59 AM  Result Value Ref Range   WBC 4.9 3.4 - 10.8 x10E3/uL   RBC 5.32 4.14 - 5.80 x10E6/uL   Hemoglobin 15.2 13.0 - 17.7 g/dL   Hematocrit 44.8 37.5 - 51.0 %   MCV 84 79 - 97 fL   MCH 28.6 26.6 - 33.0 pg   MCHC 33.9 31.5 - 35.7 g/dL   RDW 12.3 11.6 - 15.4 %   Platelets 192 150 - 450 x10E3/uL   Neutrophils 39 Not Estab. %   Lymphs 39 Not Estab. %   Monocytes 12 Not Estab. %   Eos 9 Not Estab. %   Basos 1 Not Estab. %   Neutrophils Absolute 1.9 1.4 - 7.0 x10E3/uL   Lymphocytes Absolute 1.9 0.7 - 3.1 x10E3/uL   Monocytes Absolute 0.6 0.1 - 0.9 x10E3/uL   EOS (ABSOLUTE) 0.4 0.0 - 0.4 x10E3/uL   Basophils  Absolute 0.1 0.0 - 0.2 x10E3/uL   Immature Granulocytes 0 Not Estab. %   Immature Grans (Abs) 0.0 0.0 - 0.1 x10E3/uL  Uric acid     Status: None   Collection Time: 12/28/22 11:59 AM  Result Value Ref Range   Uric Acid 6.3 3.8 - 8.4 mg/dL    Comment:            Therapeutic target for gout patients: <6.0     Fall Risk:    02/06/2023   10:18 AM 12/25/2022    9:52 AM 11/06/2022   10:20 AM 08/08/2022    8:27 AM 11/07/2021    7:31 AM  Fall Risk   Falls in the past year? 0 0 0 0 0  Number falls in past yr:  0 0 0 0  Injury with Fall?  0 0 0 0  Risk for fall due to : No Fall Risks No Fall Risks No Fall Risks No Fall Risks No Fall Risks  Follow up Falls prevention discussed;Education provided;Falls evaluation completed Falls prevention discussed Falls prevention discussed Falls prevention discussed Falls prevention discussed     Functional Status Survey: Is the patient deaf or have difficulty hearing?: No Does the patient have difficulty seeing, even when wearing glasses/contacts?: No Does the patient have difficulty concentrating, remembering, or making decisions?: No Does the patient have difficulty walking or climbing stairs?: No Does the patient have difficulty dressing or bathing?: No Does the patient have difficulty doing errands alone such as visiting a doctor's office or shopping?: No    Assessment & Plan  1. Well adult exam  Reviewed labs   2. Colon cancer screening  - Fecal Globin By Immunochemistry    -  Prostate cancer screening and PSA options (with potential risks and benefits of testing vs not testing) were discussed along with recent recs/guidelines. -USPSTF grade A and B recommendations reviewed with patient; age-appropriate recommendations, preventive care, screening tests, etc discussed and encouraged; healthy living encouraged; see AVS for patient education given to patient -Discussed importance of 150 minutes of physical activity weekly, eat two servings of  fish weekly, eat one serving of tree nuts ( cashews, pistachios, pecans, almonds.Marland Kitchen) every other day, eat 6 servings of fruit/vegetables daily and drink plenty of water and avoid sweet beverages.  -Reviewed Health Maintenance: yes

## 2023-02-06 ENCOUNTER — Encounter: Payer: Self-pay | Admitting: Family Medicine

## 2023-02-06 ENCOUNTER — Ambulatory Visit (INDEPENDENT_AMBULATORY_CARE_PROVIDER_SITE_OTHER): Payer: Managed Care, Other (non HMO) | Admitting: Family Medicine

## 2023-02-06 VITALS — BP 144/86 | HR 73 | Temp 97.8°F | Resp 16 | Ht 67.0 in | Wt 207.1 lb

## 2023-02-06 DIAGNOSIS — Z0000 Encounter for general adult medical examination without abnormal findings: Secondary | ICD-10-CM

## 2023-02-06 DIAGNOSIS — Z1211 Encounter for screening for malignant neoplasm of colon: Secondary | ICD-10-CM | POA: Diagnosis not present

## 2023-03-06 ENCOUNTER — Ambulatory Visit: Payer: Managed Care, Other (non HMO) | Admitting: Dermatology

## 2023-03-07 ENCOUNTER — Ambulatory Visit: Payer: Managed Care, Other (non HMO) | Admitting: Dermatology

## 2023-03-27 NOTE — Progress Notes (Unsigned)
Name: Fernando Hart   MRN: [id number]    DOB: 11/01/1951   Date:03/28/2023       Progress Note  Subjective  Chief Complaint  Follow Up  HPI  HTN: he is compliant with bp medication, he denies chest pain , palpitation or dizziness. BP was at goal on Lotrel but it caused lower extremity edema, we changed to valsartan 320 mg and bystolic 5 mg but bp at home has been between 135-150's he is currently taking bystolic 20 mg daily and bp is still elevated in the 140/150's 72-85. He tried going up on Bystolic to BID but he felt sleepy and is back to taking it once daily He states he is not very active at home - he is using his exercise bike 15 minutes per day. He states cannot go up a flight of stairs. Discussed cardiac evaluation but he wants to hold off. He asked to go back on old medication. We will resume HCTZ and explained risk of gout attacks so we will also resume allopurinol to take bid   CKI stage III: discussed importance of cutting down on NSAID's and taking Tylenol instead of pain. He denies pruritis, good urine output  His GFR is back to normal now, he is on ARB . We will continue to monitor BP needs to improve   B12 deficiency/Vitamin D deficiency : he has not been taking supplementation lately, he states just got some vitamin D    Hyperlipidemia: he used to take Crestor occasionally but once he started to take it daily a couple of months ago he developed severe muscle pain and stopped crestor, but now he has been off medication and still has the pain, advised him to resume medication due to his high risk of heart attacks and strokes    The 10-year ASCVD risk score (Arnett DK, et al., 2019) is: 33.4%   Values used to calculate the score:     Age: [age] years     Sex: Male     Is Non-Hispanic African American: Yes     Diabetic: No     Tobacco smoker: No     Systolic Blood Pressure: 172 mmHg     Is BP treated: Yes     HDL Cholesterol: 32 mg/dL     Total Cholesterol: 137 mg/dL     Hyperglycemia: He denies polyphagia, polydipsia or polyuria.He stopped Metformin because it made him feel fatigued. Last A1C was up to 6.2 % we will recheck it next visit    Gout: he stopped Uloric on his own months ago , no recent episodes of gout He is willing to resume medication, we will try resuming allopurinol since he took it many years ago     Patient Active Problem List   Diagnosis Date Noted   Stage 3a chronic kidney disease 11/04/2020   Vitamin D deficiency 11/04/2020   Intermittent low back pain 03/24/2018   Tinnitus of right ear 07/12/2017   Dyslipidemia 10/11/2016   Obesity 10/11/2016   Degenerative joint disease of right hip 02/28/2016   Prediabetes 01/10/2016   Controlled gout 01/10/2016   Essential hypertension 01/10/2016    Past Surgical History:  Procedure Laterality Date   ROOT CANAL      Family History  Problem Relation Age of Onset   Hypertension Mother    Diabetes Father    Diabetes Sister    GI Bleed Daughter     Social History   Tobacco Use   Smoking status: Former  Packs/day: 1.00    Years: 35.00    Additional pack years: 0.00    Total pack years: 35.00    Types: Cigarettes    Quit date: 07/2004    Years since quitting: 18.7   Smokeless tobacco: Never  Substance Use Topics   Alcohol use: Yes    Alcohol/week: 1.0 standard drink of alcohol    Types: 1 Shots of liquor per week    Comment: not every week     Current Outpatient Medications:    colchicine 0.6 MG tablet, Take 1 tablet (0.6 mg total) by mouth daily. May repeat in 1 hour if no improvement no more than 2 in 24 hours, Disp: 30 tablet, Rfl: 0   diclofenac Sodium (VOLTAREN) 1 % GEL, APPLY 2 GRAMS TOPICALLY TO AFFECTED AREA FOUR TIMES DAILY, Disp: 300 g, Rfl: 1   EPINEPHrine 0.3 mg/0.3 mL IJ SOAJ injection, Inject 0.3 mg into the muscle as needed for anaphylaxis., Disp: 1 each, Rfl: 0   ezetimibe (ZETIA) 10 MG tablet, Take 1 tablet (10 mg total) by mouth daily., Disp: 90 tablet,  Rfl: 3   Nebivolol HCl (BYSTOLIC) 20 MG TABS, Take 1 tablet (20 mg total) by mouth 2 (two) times daily., Disp: 180 tablet, Rfl: 0   valsartan (DIOVAN) 320 MG tablet, Take 1 tablet (320 mg total) by mouth daily., Disp: 90 tablet, Rfl: 0   Vitamin D, Ergocalciferol, (DRISDOL) 1.25 MG (50000 UNIT) CAPS capsule, Take 1 capsule (50,000 Units total) by mouth every 7 (seven) days., Disp: 12 capsule, Rfl: 1  Allergies  Allergen Reactions   Norvasc [Amlodipine] Swelling   Shrimp (Diagnostic)     I personally reviewed active problem list, medication list, allergies, family history, social history, health maintenance with the patient/caregiver today.   ROS  Constitutional: Negative for fever or weight change.  Respiratory: Negative for cough and shortness of breath.   Cardiovascular: Negative for chest pain or palpitations.  Gastrointestinal: Negative for abdominal pain, no bowel changes.  Musculoskeletal: Negative for gait problem or joint swelling.  Skin: Negative for rash.  Neurological: Negative for dizziness or headache.  No other specific complaints in a complete review of systems (except as listed in HPI above).   Objective  Vitals:   03/28/23 0837 03/28/23 0909  BP: (!) 192/92 (!) 172/84  Pulse: 74   Resp: 16   Temp: 97.9 F (36.6 C)   TempSrc: Oral   SpO2: 94%   Weight: 206 lb 14.4 oz (93.8 kg)   Height: 5\' 7"  (1.702 m)     Body mass index is 32.41 kg/m.  Physical Exam  Constitutional: Patient appears well-developed and well-nourished. Obese  No distress.  HEENT: head atraumatic, normocephalic, pupils equal and reactive to light, neck supple Cardiovascular: Normal rate, regular rhythm and normal heart sounds.  No murmur heard. No BLE edema. Pulmonary/Chest: Effort normal and breath sounds normal. No respiratory distress. Abdominal: Soft.  There is no tenderness. Psychiatric: Patient has a normal mood and affect. behavior is normal. Judgment and thought content normal.    Recent Results (from the past 2160 hour(s))  Lipid panel     Status: Abnormal   Collection Time: 12/28/22 11:59 AM  Result Value Ref Range   Cholesterol, Total 137 100 - 199 mg/dL   Triglycerides 161 0 - 149 mg/dL   HDL 32 (L) >09 mg/dL   VLDL Cholesterol Cal 21 5 - 40 mg/dL   LDL Chol Calc (NIH) 84 0 - 99 mg/dL   Chol/HDL Ratio  4.3 0.0 - 5.0 ratio    Comment:                                   T. Chol/HDL Ratio                                             Men  Women                               1/2 Avg.Risk  3.4    3.3                                   Avg.Risk  5.0    4.4                                2X Avg.Risk  9.6    7.1                                3X Avg.Risk 23.4   11.0   Vitamin B12     Status: None   Collection Time: 12/28/22 11:59 AM  Result Value Ref Range   Vitamin B-12 413 232 - 1,245 pg/mL  VITAMIN D 25 Hydroxy (Vit-D Deficiency, Fractures)     Status: Abnormal   Collection Time: 12/28/22 11:59 AM  Result Value Ref Range   Vit D, 25-Hydroxy 14.6 (L) 30.0 - 100.0 ng/mL    Comment: Vitamin D deficiency has been defined by the Institute of Medicine and an Endocrine Society practice guideline as a level of serum 25-OH vitamin D less than 20 ng/mL (1,2). The Endocrine Society went on to further define vitamin D insufficiency as a level between 21 and 29 ng/mL (2). 1. IOM (Institute of Medicine). 2010. Dietary reference    intakes for calcium and D. [place]: The    [employer]. 2. Holick MF, Binkley , Bischoff-Ferrari HA, et al.    Evaluation, treatment, and prevention of vitamin D    deficiency: an Endocrine Society clinical practice    guideline. JCEM. 2011 Jul; 96(7):1911-30.   Hemoglobin A1c     Status: Abnormal   Collection Time: 12/28/22 11:59 AM  Result Value Ref Range   Hgb A1c MFr Bld 6.2 (H) 4.8 - 5.6 %    Comment:          Prediabetes: 5.7 - 6.4          Diabetes: >6.4          Glycemic control for adults with diabetes:  <7.0    Est. average glucose Bld gHb Est-mCnc 131 mg/dL  Comprehensive metabolic panel     Status: Abnormal   Collection Time: 12/28/22 11:59 AM  Result Value Ref Range   Glucose 108 (H) 70 - 99 mg/dL   BUN 12 8 - 27 mg/dL   Creatinine, Ser 2.95 0.76 - 1.27 mg/dL   eGFR 62 >62 ZH/YQM/5.78   BUN/Creatinine Ratio 10 10 - 24   Sodium 142 134 - 144 mmol/L   Potassium 4.2 3.5 - 5.2 mmol/L  Chloride 104 96 - 106 mmol/L   CO2 23 20 - 29 mmol/L   Calcium 9.7 8.6 - 10.2 mg/dL   Total Protein 6.4 6.0 - 8.5 g/dL   Albumin 3.9 3.8 - 4.8 g/dL   Globulin, Total 2.5 1.5 - 4.5 g/dL   Albumin/Globulin Ratio 1.6 1.2 - 2.2   Bilirubin Total 0.6 0.0 - 1.2 mg/dL   Alkaline Phosphatase 84 44 - 121 IU/L   AST 27 0 - 40 IU/L   ALT 27 0 - 44 IU/L  CBC with Differential/Platelet     Status: None   Collection Time: 12/28/22 11:59 AM  Result Value Ref Range   WBC 4.9 3.4 - 10.8 x10E3/uL   RBC 5.32 4.14 - 5.80 x10E6/uL   Hemoglobin 15.2 13.0 - 17.7 g/dL   Hematocrit 95.6 38.7 - 51.0 %   MCV 84 79 - 97 fL   MCH 28.6 26.6 - 33.0 pg   MCHC 33.9 31.5 - 35.7 g/dL   RDW 56.4 33.2 - 95.1 %   Platelets 192 150 - 450 x10E3/uL   Neutrophils 39 Not Estab. %   Lymphs 39 Not Estab. %   Monocytes 12 Not Estab. %   Eos 9 Not Estab. %   Basos 1 Not Estab. %   Neutrophils Absolute 1.9 1.4 - 7.0 x10E3/uL   Lymphocytes Absolute 1.9 0.7 - 3.1 x10E3/uL   Monocytes Absolute 0.6 0.1 - 0.9 x10E3/uL   EOS (ABSOLUTE) 0.4 0.0 - 0.4 x10E3/uL   Basophils Absolute 0.1 0.0 - 0.2 x10E3/uL   Immature Granulocytes 0 Not Estab. %   Immature Grans (Abs) 0.0 0.0 - 0.1 x10E3/uL  Uric acid     Status: None   Collection Time: 12/28/22 11:59 AM  Result Value Ref Range   Uric Acid 6.3 3.8 - 8.4 mg/dL    Comment:            Therapeutic target for gout patients: <6.0    PHQ2/9:    03/28/2023    8:37 AM 02/06/2023   10:18 AM 12/25/2022    9:52 AM 11/06/2022   10:20 AM 08/08/2022    8:27 AM  Depression screen PHQ 2/9  Decreased  Interest 0 0 0 0 0  Down, Depressed, Hopeless 0 0 0 0 0  PHQ - 2 Score 0 0 0 0 0  Altered sleeping 0 0 0 0 0  Tired, decreased energy 0 0 0 0 0  Change in appetite 0 0 0 0 0  Feeling bad or failure about yourself  0 0 0 0 0  Trouble concentrating 0 0 0 0 0  Moving slowly or fidgety/restless 0 0 0 0 0  Suicidal thoughts 0 0 0 0 0  PHQ-9 Score 0 0 0 0 0    phq 9 is negative   Fall Risk:    03/28/2023    8:36 AM 02/06/2023   10:18 AM 12/25/2022    9:52 AM 11/06/2022   10:20 AM 08/08/2022    8:27 AM  Fall Risk   Falls in the past year? 0 0 0 0 0  Number falls in past yr:   0 0 0  Injury with Fall?   0 0 0  Risk for fall due to : No Fall Risks No Fall Risks No Fall Risks No Fall Risks No Fall Risks  Follow up Falls prevention discussed Falls prevention discussed;Education provided;Falls evaluation completed Falls prevention discussed Falls prevention discussed Falls prevention discussed      Functional  Status Survey: Is the patient deaf or have difficulty hearing?: No Does the patient have difficulty seeing, even when wearing glasses/contacts?: No Does the patient have difficulty concentrating, remembering, or making decisions?: No Does the patient have difficulty walking or climbing stairs?: No Does the patient have difficulty dressing or bathing?: No Does the patient have difficulty doing errands alone such as visiting a doctor's office or shopping?: No    Assessment & Plan  1. Controlled gout  - allopurinol (ZYLOPRIM) 100 MG tablet; Take 1 tablet (100 mg total) by mouth 2 (two) times daily.  Dispense: 180 tablet; Refill: 0  2. Stage 3a chronic kidney disease  - olmesartan-hydrochlorothiazide (BENICAR HCT) 40-25 MG tablet; Take 1 tablet by mouth daily.  Dispense: 90 tablet; Refill: 0  3. Essential hypertension  - olmesartan-hydrochlorothiazide (BENICAR HCT) 40-25 MG tablet; Take 1 tablet by mouth daily.  Dispense: 90 tablet; Refill: 0 - Nebivolol HCl (BYSTOLIC) 20 MG  TABS; Take 1 tablet (20 mg total) by mouth once daily Dispense:90tablet; Refill: 0  4. Low vitamin B12 level  Resume supplementation  5. Acquired deformity of toenail  - Ambulatory referral to Podiatry  6. Dyslipidemia  - pravastatin (PRAVACHOL) 40 MG tablet; Take 1 tablet (40 mg total) by mouth daily.  Dispense: 90 tablet; Refill: 0

## 2023-03-28 ENCOUNTER — Ambulatory Visit: Payer: Managed Care, Other (non HMO) | Admitting: Family Medicine

## 2023-03-28 ENCOUNTER — Encounter: Payer: Self-pay | Admitting: Family Medicine

## 2023-03-28 VITALS — BP 172/82 | HR 74 | Temp 97.9°F | Resp 16 | Ht 67.0 in | Wt 206.9 lb

## 2023-03-28 DIAGNOSIS — E785 Hyperlipidemia, unspecified: Secondary | ICD-10-CM

## 2023-03-28 DIAGNOSIS — M109 Gout, unspecified: Secondary | ICD-10-CM

## 2023-03-28 DIAGNOSIS — I10 Essential (primary) hypertension: Secondary | ICD-10-CM

## 2023-03-28 DIAGNOSIS — R7989 Other specified abnormal findings of blood chemistry: Secondary | ICD-10-CM

## 2023-03-28 DIAGNOSIS — N1831 Chronic kidney disease, stage 3a: Secondary | ICD-10-CM

## 2023-03-28 DIAGNOSIS — L608 Other nail disorders: Secondary | ICD-10-CM

## 2023-03-28 MED ORDER — OLMESARTAN MEDOXOMIL-HCTZ 40-25 MG PO TABS: 1.0000 | ORAL_TABLET | Freq: Every day | ORAL | 0 refills | Status: DC

## 2023-03-28 MED ORDER — PRAVASTATIN SODIUM 40 MG PO TABS
40.0000 mg | ORAL_TABLET | Freq: Every day | ORAL | 0 refills | Status: DC
Start: 2023-03-28 — End: 2023-06-27

## 2023-03-28 MED ORDER — NEBIVOLOL HCL 20 MG PO TABS
20.0000 mg | ORAL_TABLET | Freq: Every morning | ORAL | 0 refills | Status: AC
Start: 2023-03-28 — End: ?

## 2023-03-28 MED ORDER — ALLOPURINOL 100 MG PO TABS: 100.0000 mg | ORAL_TABLET | Freq: Two times a day (BID) | ORAL | 0 refills | Status: DC

## 2023-03-28 MED ORDER — NEBIVOLOL HCL 20 MG PO TABS: 20.0000 mg | ORAL_TABLET | Freq: Two times a day (BID) | ORAL | 0 refills | Status: DC

## 2023-03-28 NOTE — Addendum Note (Signed)
Addended by: Alba Cory F on: 03/28/2023 09:40 AM   Modules accepted: Orders

## 2023-04-24 ENCOUNTER — Ambulatory Visit: Payer: Managed Care, Other (non HMO) | Admitting: Podiatry

## 2023-04-24 DIAGNOSIS — B351 Tinea unguium: Secondary | ICD-10-CM | POA: Diagnosis not present

## 2023-04-24 DIAGNOSIS — L600 Ingrowing nail: Secondary | ICD-10-CM

## 2023-04-24 DIAGNOSIS — M79676 Pain in unspecified toe(s): Secondary | ICD-10-CM | POA: Diagnosis not present

## 2023-04-24 MED ORDER — NEOMYCIN-POLYMYXIN-HC 3.5-10000-1 OT SOLN: OTIC | 0 refills | Status: DC

## 2023-04-24 NOTE — Progress Notes (Signed)
He presents today chief complaint of ingrown toenail to the tibial border of the hallux left.  Painful elongated thickened nails bilaterally.  Objective: Vital signs stable alert oriented x 3 pulses remain palpable bilateral.  Sharp incurvated nail margin with erythema no purulence no malodor along the tibial border of the hallux left.  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  Assessment: Ingrown nail with nail dystrophy lesser nails.  Plan: Chemical matricectomy was performed to the hallux left after local anesthetic was administered.  He tolerated the procedure well without complications was given both oral and home-going instruction for the care and soaking of the toe as well as a prescription for Cortisporin Otic to be applied twice daily after soaking.  The remainder of the nails were debrided 1 through 5 bilaterally.  Follow-up with him in 2 weeks

## 2023-04-24 NOTE — Patient Instructions (Signed)

## 2023-04-25 ENCOUNTER — Ambulatory Visit: Payer: Managed Care, Other (non HMO) | Admitting: Dermatology

## 2023-05-05 ENCOUNTER — Other Ambulatory Visit: Payer: Self-pay | Admitting: Family Medicine

## 2023-05-05 DIAGNOSIS — E559 Vitamin D deficiency, unspecified: Secondary | ICD-10-CM

## 2023-05-09 ENCOUNTER — Ambulatory Visit: Payer: Managed Care, Other (non HMO) | Admitting: Dermatology

## 2023-05-09 DIAGNOSIS — L918 Other hypertrophic disorders of the skin: Secondary | ICD-10-CM | POA: Diagnosis not present

## 2023-05-09 DIAGNOSIS — L821 Other seborrheic keratosis: Secondary | ICD-10-CM | POA: Diagnosis not present

## 2023-05-09 NOTE — Progress Notes (Signed)
   Follow-Up Visit   Subjective  Fernando Hart is a 72 y.o. male who presents for the following: 5 months f/u on irritated seborrheic keratosis on his neck, patient would like to have ISK's removed from the right side of his neck today.  The patient has spots, moles and lesions to be evaluated, some may be new or changing and the patient may have concern these could be cancer.  The following portions of the chart were reviewed this encounter and updated as appropriate: medications, allergies, medical history  Review of Systems:  No other skin or systemic complaints except as noted in HPI or Assessment and Plan.  Objective  Well appearing patient in no apparent distress; mood and affect are within normal limits. A focused examination was performed of the following areas: Relevant exam findings are noted in the Assessment and Plan.   Assessment & Plan   SKIN TAGS (Acrochordons) - Erythematous fleshy, skin-colored pedunculated papules - Benign appearing.  - Symptomatic, irritating, patient would like treated. He is aware that skin tags are benign lesions, and their removal is often not considered medically necessary.  Their insurance may not cover the procedure. We recommend all patients call their insurance company to confirm whether the procedure is covered before having it done. Informed consent was obtained. Procedure Note: - Prior to the procedure, reviewed the expected small wound. Also reviewed the risk of leaving a small scar and the small risk of infection.  PROCEDURE - The areas were prepped with isopropyl alcohol. A small amount of lidocaine 1% with epinephrine was injected at the base of each lesion to achieve good local anesthesia. The skin tags were removed using a snip technique. Aluminum chloride was used for hemostasis and some smaller areas treated with Electrodesiccation   Petrolatum and a bandage were applied. The procedure was tolerated well. - Wound care was reviewed  with the patient. They were advised to call with any concerns.  - Locations: right neck  - Total number of treated acrochordons 15    Skin tags, multiple acquired  Seborrheic keratosis  SEBORRHEIC KERATOSIS - Stuck-on, waxy, tan-brown papules and/or plaques  - Benign-appearing - Discussed benign etiology and prognosis. - Observe - Call for any changes   Return in about 6 months (around 11/09/2023) for ISKs.  IAngelique Holm, CMA, am acting as scribe for Armida Sans, MD .   Documentation: I have reviewed the above documentation for accuracy and completeness, and I agree with the above.  Armida Sans, MD

## 2023-05-09 NOTE — Patient Instructions (Addendum)
Wound Care Instructions  Cleanse wound gently with soap and water once a day then pat dry with clean gauze. Apply a thin coat of Petrolatum (petroleum jelly, "Vaseline") over the wound (unless you have an allergy to this). We recommend that you use a new, sterile tube of Vaseline. Do not pick or remove scabs. Do not remove the yellow or white "healing tissue" from the base of the wound.  Cover the wound with fresh, clean, nonstick gauze and secure with paper tape. You may use Band-Aids in place of gauze and tape if the wound is small enough, but would recommend trimming much of the tape off as there is often too much. Sometimes Band-Aids can irritate the skin.  You should call the office for your biopsy report after 1 week if you have not already been contacted.  If you experience any problems, such as abnormal amounts of bleeding, swelling, significant bruising, significant pain, or evidence of infection, please call the office immediately.  FOR ADULT SURGERY PATIENTS: If you need something for pain relief you may take 1 extra strength Tylenol (acetaminophen) AND 2 Ibuprofen (200mg each) together every 4 hours as needed for pain. (do not take these if you are allergic to them or if you have a reason you should not take them.) Typically, you may only need pain medication for 1 to 3 days.        Due to recent changes in healthcare laws, you may see results of your pathology and/or laboratory studies on MyChart before the doctors have had a chance to review them. We understand that in some cases there may be results that are confusing or concerning to you. Please understand that not all results are received at the same time and often the doctors may need to interpret multiple results in order to provide you with the best plan of care or course of treatment. Therefore, we ask that you please give us 2 business days to thoroughly review all your results before contacting the office for clarification.  Should we see a critical lab result, you will be contacted sooner.   If You Need Anything After Your Visit  If you have any questions or concerns for your doctor, please call our main line at [phone] and press option 4 to reach your doctor's medical assistant. If no one answers, please leave a voicemail as directed and we will return your call as soon as possible. Messages left after 4 pm will be answered the following business day.   You may also send us a message via MyChart. We typically respond to MyChart messages within 1-2 business days.  For prescription refills, please ask your pharmacy to contact our office. Our fax number is [phone].  If you have an urgent issue when the clinic is closed that cannot wait until the next business day, you can page your doctor at the number below.    Please note that while we do our best to be available for urgent issues outside of office hours, we are not available 24/7.   If you have an urgent issue and are unable to reach us, you may choose to seek medical care at your doctor's office, retail clinic, urgent care center, or emergency room.  If you have a medical emergency, please immediately call 911 or go to the emergency department.  Pager Numbers  - Dr. Kowalski: [phone]  - Dr. Moye: [phone]  - Dr. Stewart: [phone]  In the event of inclement weather, please call our   main line at [phone] for an update on the status of any delays or closures.  Dermatology Medication Tips: Please keep the boxes that topical medications come in in order to help keep track of the instructions about where and how to use these. Pharmacies typically print the medication instructions only on the boxes and not directly on the medication tubes.   If your medication is too expensive, please contact our office at [phone] option 4 or send us a message through MyChart.   We are unable to tell what your co-pay for medications will be  in advance as this is different depending on your insurance coverage. However, we may be able to find a substitute medication at lower cost or fill out paperwork to get insurance to cover a needed medication.   If a prior authorization is required to get your medication covered by your insurance company, please allow us 1-2 business days to complete this process.  Drug prices often vary depending on where the prescription is filled and some pharmacies may offer cheaper prices.  The website www.goodrx.com contains coupons for medications through different pharmacies. The prices here do not account for what the cost may be with help from insurance (it may be cheaper with your insurance), but the website can give you the price if you did not use any insurance.  - You can print the associated coupon and take it with your prescription to the pharmacy.  - You may also stop by our office during regular business hours and pick up a GoodRx coupon card.  - If you need your prescription sent electronically to a different pharmacy, notify our office through  MyChart or by phone at [phone] option 4.     Si Usted Necesita Algo Despus de Su Visita  Tambin puede enviarnos un mensaje a travs de MyChart. Por lo general respondemos a los mensajes de MyChart en el transcurso de 1 a 2 das hbiles.  Para renovar recetas, por favor pida a su farmacia que se ponga en contacto con nuestra oficina. Nuestro nmero de fax es el [phone].  Si tiene un asunto urgente cuando la clnica est cerrada y que no puede esperar hasta el siguiente da hbil, puede llamar/localizar a su doctor(a) al nmero que aparece a continuacin.   Por favor, tenga en cuenta que aunque hacemos todo lo posible para estar disponibles para asuntos urgentes fuera del horario de oficina, no estamos disponibles las 24 horas del da, los 7 das de la semana.   Si tiene un problema urgente y no puede comunicarse con nosotros,  puede optar por buscar atencin mdica  en el consultorio de su doctor(a), en una clnica privada, en un centro de atencin urgente o en una sala de emergencias.  Si tiene una emergencia mdica, por favor llame inmediatamente al 911 o vaya a la sala de emergencias.  Nmeros de bper  - Dr. Kowalski: [phone]  - Dra. Moye: [phone]  - Dra. Stewart: [phone]  En caso de inclemencias del tiempo, por favor llame a nuestra lnea principal al [phone] para una actualizacin sobre el estado de cualquier retraso o cierre.  Consejos para la medicacin en dermatologa: Por favor, guarde las cajas en las que vienen los medicamentos de uso tpico para ayudarle a seguir las instrucciones sobre dnde y cmo usarlos. Las farmacias generalmente imprimen las instrucciones del medicamento slo en las cajas y no directamente en los tubos del medicamento.   Si su medicamento es muy caro, por favor, pngase   en contacto con nuestra oficina llamando al [phone] y presione la opcin 4 o envenos un mensaje a travs de MyChart.   No podemos decirle cul ser su copago por los medicamentos por adelantado ya que esto es diferente dependiendo de la cobertura de su seguro. Sin embargo, es posible que podamos encontrar un medicamento sustituto a menor costo o llenar un formulario para que el seguro cubra el medicamento que se considera necesario.   Si se requiere una autorizacin previa para que su compaa de seguros cubra su medicamento, por favor permtanos de 1 a 2 das hbiles para completar este proceso.  Los precios de los medicamentos varan con frecuencia dependiendo del lugar de dnde se surte la receta y alguna farmacias pueden ofrecer precios ms baratos.  El sitio web www.goodrx.com tiene cupones para medicamentos de diferentes farmacias. Los precios aqu no tienen en cuenta lo que podra costar con la ayuda del seguro (puede ser ms barato con su seguro), pero el sitio web puede darle el  precio si no utiliz ningn seguro.  - Puede imprimir el cupn correspondiente y llevarlo con su receta a la farmacia.  - Tambin puede pasar por nuestra oficina durante el horario de atencin regular y recoger una tarjeta de cupones de GoodRx.  - Si necesita que su receta se enve electrnicamente a una farmacia diferente, informe a nuestra oficina a travs de MyChart de [place] o por telfono llamando al [phone] y presione la opcin 4.     Due to recent changes in healthcare laws, you may see results of your pathology and/or laboratory studies on MyChart before the doctors have had a chance to review them. We understand that in some cases there may be results that are confusing or concerning to you. Please understand that not all results are received at the same time and often the doctors may need to interpret multiple results in order to provide you with the best plan of care or course of treatment. Therefore, we ask that you please give us 2 business days to thoroughly review all your results before contacting the office for clarification. Should we see a critical lab result, you will be contacted sooner.   If You Need Anything After Your Visit  If you have any questions or concerns for your doctor, please call our main line at [phone] and press option 4 to reach your doctor's medical assistant. If no one answers, please leave a voicemail as directed and we will return your call as soon as possible. Messages left after 4 pm will be answered the following business day.   You may also send us a message via MyChart. We typically respond to MyChart messages within 1-2 business days.  For prescription refills, please ask your pharmacy to contact our office. Our fax number is [phone].  If you have an urgent issue when the clinic is closed that cannot wait until the next business day, you can page your doctor at the number below.    Please note that while we do our best to be  available for urgent issues outside of office hours, we are not available 24/7.   If you have an urgent issue and are unable to reach us, you may choose to seek medical care at your doctor's office, retail clinic, urgent care center, or emergency room.  If you have a medical emergency, please immediately call 911 or go to the emergency department.  Pager Numbers  - Dr. Kowalski: [phone]  - Dr. Moye:   [phone]  - Dr. Stewart: [phone]  In the event of inclement weather, please call our main line at [phone] for an update on the status of any delays or closures.  Dermatology Medication Tips: Please keep the boxes that topical medications come in in order to help keep track of the instructions about where and how to use these. Pharmacies typically print the medication instructions only on the boxes and not directly on the medication tubes.   If your medication is too expensive, please contact our office at [phone] option 4 or send us a message through MyChart.   We are unable to tell what your co-pay for medications will be in advance as this is different depending on your insurance coverage. However, we may be able to find a substitute medication at lower cost or fill out paperwork to get insurance to cover a needed medication.   If a prior authorization is required to get your medication covered by your insurance company, please allow us 1-2 business days to complete this process.  Drug prices often vary depending on where the prescription is filled and some pharmacies may offer cheaper prices.  The website www.goodrx.com contains coupons for medications through different pharmacies. The prices here do not account for what the cost may be with help from insurance (it may be cheaper with your insurance), but the website can give you the price if you did not use any insurance.  - You can print the associated coupon and take it with your prescription to the pharmacy.  -  You may also stop by our office during regular business hours and pick up a GoodRx coupon card.  - If you need your prescription sent electronically to a different pharmacy, notify our office through [place] MyChart or by phone at [phone] option 4.     Si Usted Necesita Algo Despus de Su Visita  Tambin puede enviarnos un mensaje a travs de MyChart. Por lo general respondemos a los mensajes de MyChart en el transcurso de 1 a 2 das hbiles.  Para renovar recetas, por favor pida a su farmacia que se ponga en contacto con nuestra oficina. Nuestro nmero de fax es el [phone].  Si tiene un asunto urgente cuando la clnica est cerrada y que no puede esperar hasta el siguiente da hbil, puede llamar/localizar a su doctor(a) al nmero que aparece a continuacin.   Por favor, tenga en cuenta que aunque hacemos todo lo posible para estar disponibles para asuntos urgentes fuera del horario de oficina, no estamos disponibles las 24 horas del da, los 7 das de la semana.   Si tiene un problema urgente y no puede comunicarse con nosotros, puede optar por buscar atencin mdica  en el consultorio de su doctor(a), en una clnica privada, en un centro de atencin urgente o en una sala de emergencias.  Si tiene una emergencia mdica, por favor llame inmediatamente al 911 o vaya a la sala de emergencias.  Nmeros de bper  - Dr. Kowalski: [phone]  - Dra. Moye: [phone]  - Dra. Stewart: [phone]  En caso de inclemencias del tiempo, por favor llame a nuestra lnea principal al [phone] para una actualizacin sobre el estado de cualquier retraso o cierre.  Consejos para la medicacin en dermatologa: Por favor, guarde las cajas en las que vienen los medicamentos de uso tpico para ayudarle a seguir las instrucciones sobre dnde y cmo usarlos. Las farmacias generalmente imprimen las instrucciones del medicamento slo en las cajas y no directamente en   los tubos del  medicamento.   Si su medicamento es muy caro, por favor, pngase en contacto con nuestra oficina llamando al [phone] y presione la opcin 4 o envenos un mensaje a travs de MyChart.   No podemos decirle cul ser su copago por los medicamentos por adelantado ya que esto es diferente dependiendo de la cobertura de su seguro. Sin embargo, es posible que podamos encontrar un medicamento sustituto a menor costo o llenar un formulario para que el seguro cubra el medicamento que se considera necesario.   Si se requiere una autorizacin previa para que su compaa de seguros cubra su medicamento, por favor permtanos de 1 a 2 das hbiles para completar este proceso.  Los precios de los medicamentos varan con frecuencia dependiendo del lugar de dnde se surte la receta y alguna farmacias pueden ofrecer precios ms baratos.  El sitio web www.goodrx.com tiene cupones para medicamentos de diferentes farmacias. Los precios aqu no tienen en cuenta lo que podra costar con la ayuda del seguro (puede ser ms barato con su seguro), pero el sitio web puede darle el precio si no utiliz ningn seguro.  - Puede imprimir el cupn correspondiente y llevarlo con su receta a la farmacia.  - Tambin puede pasar por nuestra oficina durante el horario de atencin regular y recoger una tarjeta de cupones de GoodRx.  - Si necesita que su receta se enve electrnicamente a una farmacia diferente, informe a nuestra oficina a travs de MyChart de [place] o por telfono llamando al [phone] y presione la opcin 4.  

## 2023-05-15 ENCOUNTER — Ambulatory Visit: Payer: Managed Care, Other (non HMO) | Admitting: Podiatry

## 2023-05-15 ENCOUNTER — Encounter: Payer: Self-pay | Admitting: Podiatry

## 2023-05-15 DIAGNOSIS — Z9889 Other specified postprocedural states: Secondary | ICD-10-CM

## 2023-05-15 DIAGNOSIS — L600 Ingrowing nail: Secondary | ICD-10-CM

## 2023-05-15 NOTE — Progress Notes (Signed)
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  Objective: Vital signs are stable he is alert oriented x 3 there is no erythema edema cellulitis drainage or odor has a very small opening measuring only about 1.5 mm in diameter but there is no purulence from it on palpation.  No tenderness on palpation.  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  Plan: Continue to soak every other day until completely healed should purulence or erythema or pain worsen he is notify us immediately.

## 2023-05-22 ENCOUNTER — Encounter: Payer: Self-pay | Admitting: Dermatology

## 2023-06-26 NOTE — Progress Notes (Unsigned)
Name: Fernando Hart   MRN: [id number]    DOB: October 24, 1951   Date:06/27/2023       Progress Note  Subjective  Chief Complaint  Follow Up  HPI  HTN: he is compliant with bp medication, he denies chest pain , palpitation or dizziness. BP was at goal on Lotrel but it caused lower extremity edema, we changed to valsartan 320 mg and bystolic 5 mg but bp at home has been between 135-150's he was up on Bystolic to 20 mg but bp was still not at goal, and during his last visit it had spiked to 190's   He asked to go back on old medication. We went back on Benicar hydrochlorothiazide 40/25 mg and his bp at home has been well controlled since, sometimes it goes down to 90's/60's - it happened a couple of times in the past few months. Most of the time it is below 120, but he states no dizziness, occasionally feels sleepy. We will continue current dose and if needed we can go down on hydrochlorothiazide dose. He will continue to log his bp daily   CKI stage III: discussed importance of cutting down on NSAID's and taking Tylenol instead of pain. He denies pruritis, good urine output  His GFR is back to normal now, he is on ARB . BP is at goal    B12 deficiency/Vitamin D deficiency : he has not been taking supplementation lately, he states just got some vitamin D    Hyperlipidemia: he used to take Crestor occasionally but once he started to take it daily a couple of months ago he developed severe muscle pain and stopped crestor, but now he has been off medication and still has the pain, advised him to resume medication due to his high risk of heart attacks and strokes    The 10-year ASCVD risk score (Arnett DK, et al., 2019) is: 33.4%   Values used to calculate the score:     Age: 72 years     Sex: Male     Is Non-Hispanic African American: Yes     Diabetic: No     Tobacco smoker: No     Systolic Blood Pressure: 172 mmHg     Is BP treated: Yes     HDL Cholesterol: 32 mg/dL     Total Cholesterol: 137 mg/dL     Hyperglycemia: He denies polyphagia, polydipsia or polyuria.He stopped Metformin because it made him feel fatigued. Last A1C was up to 6.2 % we will recheck it next visit   ED: she has difficulty starting an erection, able to maintain , discussed medication, explained if chest pain needs to tell EMS  OA right knee: he needs to resume using his bike and progress to walking on a track to increase quad strength    Gout: he stopped Uloric on his own months ago , no recent episodes of gout He is willing to resume medication, we will try resuming allopurinol since he took it many years ago    Patient Active Problem List   Diagnosis Date Noted   Stage 3a chronic kidney disease (HCC) 11/04/2020   Vitamin D deficiency 11/04/2020   Intermittent low back pain 03/24/2018   Tinnitus of right ear 07/12/2017   Dyslipidemia 10/11/2016   Obesity 10/11/2016   Degenerative joint disease of right hip 02/28/2016   Prediabetes 01/10/2016   Controlled gout 01/10/2016   Essential hypertension 01/10/2016    Past Surgical History:  Procedure Laterality Date   ROOT CANAL  Family History  Problem Relation Age of Onset   Hypertension Mother    Diabetes Father    Diabetes Sister    GI Bleed Daughter     Social History   Tobacco Use   Smoking status: Former    Current packs/day: 0.00    Average packs/day: 1 pack/day for 35.0 years (35.0 ttl pk-yrs)    Types: Cigarettes    Start date: 07/1969    Quit date: 07/2004    Years since quitting: 18.9   Smokeless tobacco: Never  Substance Use Topics   Alcohol use: Yes    Alcohol/week: 1.0 standard drink of alcohol    Types: 1 Shots of liquor per week    Comment: not every week     Current Outpatient Medications:    Cholecalciferol (VITAMIN D3) 50 MCG (2000 UT) capsule, Take 1 capsule (2,000 Units total) by mouth daily., Disp: 100 capsule, Rfl: 1   diclofenac Sodium (VOLTAREN) 1 % GEL, APPLY 2 GRAMS TOPICALLY TO AFFECTED AREA FOUR TIMES  DAILY, Disp: 300 g, Rfl: 1   EPINEPHrine 0.3 mg/0.3 mL IJ SOAJ injection, Inject 0.3 mg into the muscle as needed for anaphylaxis., Disp: 1 each, Rfl: 0   tadalafil (CIALIS) 5 MG tablet, Take 1 tablet (5 mg total) by mouth daily., Disp: 90 tablet, Rfl: 1   allopurinol (ZYLOPRIM) 100 MG tablet, Take 1 tablet (100 mg total) by mouth 2 (two) times daily., Disp: 180 tablet, Rfl: 1   colchicine 0.6 MG tablet, Take 1 tablet (0.6 mg total) by mouth daily. May repeat in 1 hour if no improvement no more than 2 in 24 hours - gout attack, Disp: 30 tablet, Rfl: 0   olmesartan-hydrochlorothiazide (BENICAR HCT) 40-25 MG tablet, Take 1 tablet by mouth daily., Disp: 90 tablet, Rfl: 0   pravastatin (PRAVACHOL) 40 MG tablet, Take 1 tablet (40 mg total) by mouth daily., Disp: 90 tablet, Rfl: 1  Allergies  Allergen Reactions   Norvasc [Amlodipine] Swelling   Shrimp (Diagnostic)     I personally reviewed active problem list, medication list, allergies, family history, social history, health maintenance with the patient/caregiver today.   ROS  Constitutional: Negative for fever or weight change.  Respiratory: Negative for cough and shortness of breath.   Cardiovascular: Negative for chest pain or palpitations.  Gastrointestinal: Negative for abdominal pain, no bowel changes.  Musculoskeletal: Negative for gait problem, right  joint swelling.  Skin: Negative for rash.  Neurological: Negative for dizziness or headache.  No other specific complaints in a complete review of systems (except as listed in HPI above).   Objective  Vitals:   06/27/23 0934  BP: 130/72  Pulse: 84  Resp: 14  Temp: 98 F (36.7 C)  TempSrc: Oral  SpO2: 95%  Weight: 204 lb 8 oz (92.8 kg)  Height: 5\' 7"  (1.702 m)    Body mass index is 32.03 kg/m.  Physical Exam  Constitutional: Patient appears well-developed and well-nourished. Obese  No distress.  HEENT: head atraumatic, normocephalic, pupils equal and reactive to light,  neck supple Cardiovascular: Normal rate, regular rhythm and normal heart sounds.  No murmur heard. No BLE edema. Pulmonary/Chest: Effort normal and breath sounds normal. No respiratory distress. Abdominal: Soft.  There is no tenderness. Muscular Skeletal: some effusion right knee ,no redness  Psychiatric: Patient has a normal mood and affect. behavior is normal. Judgment and thought content normal.    PHQ2/9:    06/27/2023    9:36 AM 03/28/2023    8:37  AM 02/06/2023   10:18 AM 12/25/2022    9:52 AM 11/06/2022   10:20 AM  Depression screen PHQ 2/9  Decreased Interest 0 0 0 0 0  Down, Depressed, Hopeless 0 0 0 0 0  PHQ - 2 Score 0 0 0 0 0  Altered sleeping 0 0 0 0 0  Tired, decreased energy 0 0 0 0 0  Change in appetite 0 0 0 0 0  Feeling bad or failure about yourself  0 0 0 0 0  Trouble concentrating 0 0 0 0 0  Moving slowly or fidgety/restless 0 0 0 0 0  Suicidal thoughts 0 0 0 0 0  PHQ-9 Score 0 0 0 0 0    phq 9 is negative   Fall Risk:    06/27/2023    9:35 AM 03/28/2023    8:36 AM 02/06/2023   10:18 AM 12/25/2022    9:52 AM 11/06/2022   10:20 AM  Fall Risk   Falls in the past year? 0 0 0 0 0  Number falls in past yr:    0 0  Injury with Fall?    0 0  Risk for fall due to : No Fall Risks No Fall Risks No Fall Risks No Fall Risks No Fall Risks  Follow up Falls prevention discussed Falls prevention discussed Falls prevention discussed;Education provided;Falls evaluation completed Falls prevention discussed Falls prevention discussed      Functional Status Survey: Is the patient deaf or have difficulty hearing?: No Does the patient have difficulty seeing, even when wearing glasses/contacts?: No Does the patient have difficulty concentrating, remembering, or making decisions?: No Does the patient have difficulty walking or climbing stairs?: Yes Does the patient have difficulty dressing or bathing?: No Does the patient have difficulty doing errands alone such as visiting a  doctor's office or shopping?: No    Assessment & Plan  1. Stage 3a chronic kidney disease (HCC)  - olmesartan-hydrochlorothiazide (BENICAR HCT) 40-25 MG tablet; Take 1 tablet by mouth daily.  Dispense: 90 tablet; Refill: 0  2. Controlled gout  - colchicine 0.6 MG tablet; Take 1 tablet (0.6 mg total) by mouth daily. May repeat in 1 hour if no improvement no more than 2 in 24 hours - gout attack  Dispense: 30 tablet; Refill: 0 - allopurinol (ZYLOPRIM) 100 MG tablet; Take 1 tablet (100 mg total) by mouth 2 (two) times daily.  Dispense: 180 tablet; Refill: 1  3. Vitamin D deficiency  - Cholecalciferol (VITAMIN D3) 50 MCG (2000 UT) capsule; Take 1 capsule (2,000 Units total) by mouth daily.  Dispense: 100 capsule; Refill: 1  4. Low vitamin B12 level  Continue supplementation  5. Essential hypertension  - olmesartan-hydrochlorothiazide (BENICAR HCT) 40-25 MG tablet; Take 1 tablet by mouth daily.  Dispense: 90 tablet; Refill: 0  He will monitor and if bp stays below 120 we will decrease dose of hydrochlorothiazide to 12.5 mg   6. Primary osteoarthritis of both knees  Discussed activity   7. Dyslipidemia  - pravastatin (PRAVACHOL) 40 MG tablet; Take 1 tablet (40 mg total) by mouth daily.  Dispense: 90 tablet; Refill: 1  8. Acquired deformity of toenail  Seen by podiatrist   9. Other male erectile dysfunction  - tadalafil (CIALIS) 5 MG tablet; Take 1 tablet (5 mg total) by mouth daily.  Dispense: 90 tablet; Refill: 1

## 2023-06-27 ENCOUNTER — Encounter: Payer: Self-pay | Admitting: Family Medicine

## 2023-06-27 ENCOUNTER — Ambulatory Visit: Payer: Managed Care, Other (non HMO) | Admitting: Family Medicine

## 2023-06-27 VITALS — BP 130/72 | HR 84 | Temp 98.0°F | Resp 14 | Ht 67.0 in | Wt 204.5 lb

## 2023-06-27 DIAGNOSIS — E785 Hyperlipidemia, unspecified: Secondary | ICD-10-CM

## 2023-06-27 DIAGNOSIS — I10 Essential (primary) hypertension: Secondary | ICD-10-CM

## 2023-06-27 DIAGNOSIS — R7989 Other specified abnormal findings of blood chemistry: Secondary | ICD-10-CM

## 2023-06-27 DIAGNOSIS — N1831 Chronic kidney disease, stage 3a: Secondary | ICD-10-CM

## 2023-06-27 DIAGNOSIS — E559 Vitamin D deficiency, unspecified: Secondary | ICD-10-CM

## 2023-06-27 DIAGNOSIS — M109 Gout, unspecified: Secondary | ICD-10-CM | POA: Diagnosis not present

## 2023-06-27 DIAGNOSIS — N528 Other male erectile dysfunction: Secondary | ICD-10-CM

## 2023-06-27 DIAGNOSIS — M170 Bilateral primary osteoarthritis of knee: Secondary | ICD-10-CM

## 2023-06-27 DIAGNOSIS — L608 Other nail disorders: Secondary | ICD-10-CM

## 2023-06-27 MED ORDER — VITAMIN D3 50 MCG (2000 UT) PO CAPS
2000.0000 [IU] | ORAL_CAPSULE | Freq: Every day | ORAL | 1 refills | Status: AC
Start: 2023-06-27 — End: ?

## 2023-06-27 MED ORDER — PRAVASTATIN SODIUM 40 MG PO TABS: 40.0000 mg | ORAL_TABLET | Freq: Every day | ORAL | 1 refills | Status: DC

## 2023-06-27 MED ORDER — TADALAFIL 5 MG PO TABS
5.0000 mg | ORAL_TABLET | Freq: Every day | ORAL | 1 refills | Status: DC
Start: 2023-06-27 — End: 2023-10-03

## 2023-06-27 MED ORDER — DICLOFENAC SODIUM 1 % EX GEL
CUTANEOUS | 1 refills | Status: DC
Start: 2023-06-27 — End: 2023-10-03

## 2023-06-27 MED ORDER — ALLOPURINOL 100 MG PO TABS
100.0000 mg | ORAL_TABLET | Freq: Two times a day (BID) | ORAL | 1 refills | Status: DC
Start: 2023-06-27 — End: 2023-10-03

## 2023-06-27 MED ORDER — OLMESARTAN MEDOXOMIL-HCTZ 40-25 MG PO TABS
1.0000 | ORAL_TABLET | Freq: Every day | ORAL | 0 refills | Status: DC
Start: 2023-06-27 — End: 2023-10-03

## 2023-06-27 MED ORDER — COLCHICINE 0.6 MG PO TABS
0.6000 mg | ORAL_TABLET | Freq: Every day | ORAL | 0 refills | Status: DC
Start: 2023-06-27 — End: 2023-11-08

## 2023-07-10 LAB — BASIC METABOLIC PANEL: Glucose: 103

## 2023-07-10 LAB — LIPID PANEL
Cholesterol: 117 (ref 0–200)
HDL: 35 (ref 35–70)
LDL Cholesterol: 65
Triglycerides: 86 (ref 40–160)

## 2023-07-10 LAB — HEMOGLOBIN A1C: Hemoglobin A1C: 6.2

## 2023-10-02 NOTE — Progress Notes (Unsigned)
Name: Fernando Hart   MRN: [id number]    DOB: Sep 09, 1951   Date:10/03/2023       Progress Note  Subjective  Chief Complaint  Follow Up  HPI  HTN: he is doing well on current regiment, taking Benicar 40/25 mg daily , he had chest pain when he took allopurinol but it resolved, discussed he is high risk for heart disease and discussed Cardiac Calcium score and he agreed on having it done  . BP at home is usually lower but denies dizziness or orthostatic changes. Advised to bring machine to calibrate it .   CKI stage III: discussed importance of cutting down on NSAID's and taking Tylenol instead of pain. He denies pruritis, good urine output  His GFR is back to normal now, he is on ARB . We will recheck labs next visit    B12 deficiency/Vitamin D deficiency : he has not been taking supplementation lately, and we will recheck next visit . He has tingling on hands and feet    Hyperlipidemia: he used to take Crestor occasionally but once he started to take it daily a couple of months ago he developed severe muscle pain and stopped crestor,  we tried Pravastatin and caused same symptoms , his cardiac risk is elevated and he is willing to try taking Nexlizet   Hyperglycemia: He denies polyphagia, polydipsia or polyuria.He stopped Metformin because it made him feel fatigued. Last A1C was up to 6.2 % was the same in July 2024 at work   ED: she has difficulty starting an erection, able to maintain , discussed medication, stable   OA right knee: he has been trying to be more active    Gout: he stopped Uloric on his own months ago , no recent episodes of gout He went on Allopurinol and it caused chest pain so he stopped, he does not want to take any medications for prevention at this time  Patient Active Problem List   Diagnosis Date Noted   Stage 3a chronic kidney disease (HCC) 11/04/2020   Vitamin D deficiency 11/04/2020   Intermittent low back pain 03/24/2018   Tinnitus of right ear 07/12/2017    Dyslipidemia 10/11/2016   Obesity 10/11/2016   Degenerative joint disease of right hip 02/28/2016   Prediabetes 01/10/2016   Controlled gout 01/10/2016   Essential hypertension 01/10/2016    Past Surgical History:  Procedure Laterality Date   ROOT CANAL      Family History  Problem Relation Age of Onset   Hypertension Mother    Diabetes Father    Diabetes Sister    GI Bleed Daughter     Social History   Tobacco Use   Smoking status: Former    Current packs/day: 0.00    Average packs/day: 1 pack/day for 35.0 years (35.0 ttl pk-yrs)    Types: Cigarettes    Start date: 07/1969    Quit date: 07/2004    Years since quitting: 19.2   Smokeless tobacco: Never  Substance Use Topics   Alcohol use: Yes    Alcohol/week: 1.0 standard drink of alcohol    Types: 1 Shots of liquor per week    Comment: not every week     Current Outpatient Medications:    Berberine Chloride 500 MG CAPS, Take by mouth., Disp: , Rfl:    Cholecalciferol (VITAMIN D3) 50 MCG (2000 UT) capsule, Take 1 capsule (2,000 Units total) by mouth daily., Disp: 100 capsule, Rfl: 1   cyanocobalamin (VITAMIN B12) 500 MCG tablet,  Take 500 mcg by mouth daily., Disp: , Rfl:    diclofenac Sodium (VOLTAREN) 1 % GEL, APPLY 2 GRAMS TOPICALLY TO AFFECTED AREA FOUR TIMES DAILY, Disp: 300 g, Rfl: 1   EPINEPHrine 0.3 mg/0.3 mL IJ SOAJ injection, Inject 0.3 mg into the muscle as needed for anaphylaxis., Disp: 1 each, Rfl: 0   olmesartan-hydrochlorothiazide (BENICAR HCT) 40-25 MG tablet, Take 1 tablet by mouth daily., Disp: 90 tablet, Rfl: 0   tadalafil (CIALIS) 5 MG tablet, Take 1 tablet (5 mg total) by mouth daily., Disp: 90 tablet, Rfl: 1   colchicine 0.6 MG tablet, Take 1 tablet (0.6 mg total) by mouth daily. May repeat in 1 hour if no improvement no more than 2 in 24 hours - gout attack (Patient not taking: Reported on 10/03/2023), Disp: 30 tablet, Rfl: 0  Allergies  Allergen Reactions   Allopurinol Other (See Comments)     Chest pain, it has happened two separate times when he was given allopurinol    Norvasc [Amlodipine] Swelling   Shrimp (Diagnostic)     I personally reviewed active problem list, medication list, allergies, family history, social history, health maintenance with the patient/caregiver today.   ROS  Ten systems reviewed and is negative except as mentioned in HPI    Objective  Vitals:   10/03/23 0832  BP: 136/70  Pulse: 93  Resp: 16  Temp: 97.6 F (36.4 C)  TempSrc: Oral  SpO2: 97%  Weight: 192 lb 11.2 oz (87.4 kg)  Height: 5\' 7"  (1.702 m)    Body mass index is 30.18 kg/m.  Physical Exam  Constitutional: Patient appears well-developed and well-nourished. Obese  No distress.  HEENT: head atraumatic, normocephalic, pupils equal and reactive to light, neck supple Cardiovascular: Normal rate, regular rhythm and normal heart sounds.  No murmur heard. No BLE edema. Pulmonary/Chest: Effort normal and breath sounds normal. No respiratory distress. Abdominal: Soft.  There is no tenderness. Psychiatric: Patient has a normal mood and affect. behavior is normal. Judgment and thought content normal.    PHQ2/9:    10/03/2023    8:34 AM 06/27/2023    9:36 AM 03/28/2023    8:37 AM 02/06/2023   10:18 AM 12/25/2022    9:52 AM  Depression screen PHQ 2/9  Decreased Interest 0 0 0 0 0  Down, Depressed, Hopeless 0 0 0 0 0  PHQ - 2 Score 0 0 0 0 0  Altered sleeping 0 0 0 0 0  Tired, decreased energy 0 0 0 0 0  Change in appetite 0 0 0 0 0  Feeling bad or failure about yourself  0 0 0 0 0  Trouble concentrating 0 0 0 0 0  Moving slowly or fidgety/restless 0 0 0 0 0  Suicidal thoughts 0 0 0 0 0  PHQ-9 Score 0 0 0 0 0    phq 9 is negative   Fall Risk:    10/03/2023    8:34 AM 06/27/2023    9:35 AM 03/28/2023    8:36 AM 02/06/2023   10:18 AM 12/25/2022    9:52 AM  Fall Risk   Falls in the past year? 0 0 0 0 0  Number falls in past yr:     0  Injury with Fall?     0  Risk for  fall due to : No Fall Risks No Fall Risks No Fall Risks No Fall Risks No Fall Risks  Follow up Falls prevention discussed Falls prevention discussed Falls prevention  discussed Falls prevention discussed;Education provided;Falls evaluation completed Falls prevention discussed      Functional Status Survey: Is the patient deaf or have difficulty hearing?: No Does the patient have difficulty seeing, even when wearing glasses/contacts?: No Does the patient have difficulty concentrating, remembering, or making decisions?: No Does the patient have difficulty walking or climbing stairs?: No Does the patient have difficulty dressing or bathing?: No Does the patient have difficulty doing errands alone such as visiting a doctor's office or shopping?: No    Assessment & Plan  1. Stage 3a chronic kidney disease (HCC)  - olmesartan-hydrochlorothiazide (BENICAR HCT) 40-25 MG tablet; Take 1 tablet by mouth daily.  Dispense: 90 tablet; Refill: 1  2. Controlled gout  Last uric acid is at goal   3. Vitamin D deficiency  Continue supplementation   4. Need for immunization against influenza  - Flu Vaccine Trivalent High Dose (Fluad)  5. Essential hypertension  - olmesartan-hydrochlorothiazide (BENICAR HCT) 40-25 MG tablet; Take 1 tablet by mouth daily.  Dispense: 90 tablet; Refill: 1  6. Low vitamin B12 level  Discussed changing to SL b12  7. Dyslipidemia  - Bempedoic Acid-Ezetimibe (NEXLIZET) 180-10 MG TABS; Take 1 tablet by mouth daily at 12 noon.  Dispense: 90 tablet; Refill: 1 - CT CORONARY MORPH W/CTA COR W/SCORE W/CA W/CM &/OR WO/CM; Future  8. Primary osteoarthritis of both knees  stable  9. Hyperglycemia  Last A1C was 6.2 %   10. Statin myopathy   11. Other male erectile dysfunction  - tadalafil (CIALIS) 5 MG tablet; Take 1 tablet (5 mg total) by mouth daily.  Dispense: 90 tablet; Refill: 1  12. Precordial pain  - CT CORONARY MORPH W/CTA COR W/SCORE W/CA W/CM &/OR  WO/CM; Future

## 2023-10-03 ENCOUNTER — Other Ambulatory Visit: Payer: Self-pay

## 2023-10-03 ENCOUNTER — Ambulatory Visit: Payer: Managed Care, Other (non HMO) | Admitting: Family Medicine

## 2023-10-03 ENCOUNTER — Encounter: Payer: Self-pay | Admitting: Family Medicine

## 2023-10-03 VITALS — BP 136/70 | HR 93 | Temp 97.6°F | Resp 16 | Ht 67.0 in | Wt 192.7 lb

## 2023-10-03 DIAGNOSIS — E559 Vitamin D deficiency, unspecified: Secondary | ICD-10-CM | POA: Diagnosis not present

## 2023-10-03 DIAGNOSIS — Z23 Encounter for immunization: Secondary | ICD-10-CM | POA: Diagnosis not present

## 2023-10-03 DIAGNOSIS — N1831 Chronic kidney disease, stage 3a: Secondary | ICD-10-CM | POA: Diagnosis not present

## 2023-10-03 DIAGNOSIS — M170 Bilateral primary osteoarthritis of knee: Secondary | ICD-10-CM

## 2023-10-03 DIAGNOSIS — I10 Essential (primary) hypertension: Secondary | ICD-10-CM

## 2023-10-03 DIAGNOSIS — T466X5A Adverse effect of antihyperlipidemic and antiarteriosclerotic drugs, initial encounter: Secondary | ICD-10-CM

## 2023-10-03 DIAGNOSIS — G720 Drug-induced myopathy: Secondary | ICD-10-CM

## 2023-10-03 DIAGNOSIS — M109 Gout, unspecified: Secondary | ICD-10-CM | POA: Diagnosis not present

## 2023-10-03 DIAGNOSIS — R7989 Other specified abnormal findings of blood chemistry: Secondary | ICD-10-CM

## 2023-10-03 DIAGNOSIS — E785 Hyperlipidemia, unspecified: Secondary | ICD-10-CM

## 2023-10-03 DIAGNOSIS — N528 Other male erectile dysfunction: Secondary | ICD-10-CM

## 2023-10-03 DIAGNOSIS — R072 Precordial pain: Secondary | ICD-10-CM

## 2023-10-03 DIAGNOSIS — R739 Hyperglycemia, unspecified: Secondary | ICD-10-CM

## 2023-10-03 MED ORDER — DICLOFENAC SODIUM 1 % EX GEL: CUTANEOUS | 1 refills | Status: AC

## 2023-10-03 MED ORDER — OLMESARTAN MEDOXOMIL-HCTZ 40-25 MG PO TABS
1.0000 | ORAL_TABLET | Freq: Every day | ORAL | 1 refills | Status: DC
Start: 2023-10-03 — End: 2024-01-03

## 2023-10-03 MED ORDER — NEXLIZET 180-10 MG PO TABS
1.0000 | ORAL_TABLET | Freq: Every day | ORAL | 1 refills | Status: DC
Start: 2023-10-03 — End: 2024-01-03

## 2023-10-03 MED ORDER — TADALAFIL 5 MG PO TABS
5.0000 mg | ORAL_TABLET | Freq: Every day | ORAL | 1 refills | Status: DC
Start: 2023-10-03 — End: 2024-05-18

## 2023-10-10 LAB — FECAL OCCULT BLOOD, IMMUNOCHEMICAL: IFOBT: NEGATIVE

## 2023-10-15 ENCOUNTER — Encounter (HOSPITAL_COMMUNITY): Payer: Self-pay

## 2023-10-15 ENCOUNTER — Other Ambulatory Visit (HOSPITAL_COMMUNITY): Payer: Self-pay | Admitting: *Deleted

## 2023-10-15 MED ORDER — METOPROLOL TARTRATE 100 MG PO TABS: ORAL_TABLET | ORAL | 0 refills | Status: DC

## 2023-10-15 MED ORDER — IVABRADINE HCL 7.5 MG PO TABS: ORAL_TABLET | ORAL | 0 refills | Status: DC

## 2023-10-17 ENCOUNTER — Ambulatory Visit: Admission: RE | Admit: 2023-10-17 | Payer: Managed Care, Other (non HMO) | Source: Ambulatory Visit

## 2023-10-24 ENCOUNTER — Ambulatory Visit
Admission: RE | Admit: 2023-10-24 | Discharge: 2023-10-24 | Disposition: A | Discharge status: Home / Self Care | Payer: Managed Care, Other (non HMO) | Source: Ambulatory Visit | Attending: Family Medicine | Admitting: Family Medicine

## 2023-10-24 ENCOUNTER — Other Ambulatory Visit: Payer: Self-pay | Admitting: Family Medicine

## 2023-10-24 DIAGNOSIS — R931 Abnormal findings on diagnostic imaging of heart and coronary circulation: Secondary | ICD-10-CM

## 2023-10-24 DIAGNOSIS — R072 Precordial pain: Secondary | ICD-10-CM | POA: Insufficient documentation

## 2023-10-24 DIAGNOSIS — E785 Hyperlipidemia, unspecified: Secondary | ICD-10-CM | POA: Insufficient documentation

## 2023-10-24 DIAGNOSIS — I2510 Atherosclerotic heart disease of native coronary artery without angina pectoris: Secondary | ICD-10-CM

## 2023-10-24 LAB — POCT I-STAT CREATININE: Creatinine, Ser: 1.4 mg/dL — ABNORMAL HIGH (ref 0.61–1.24)

## 2023-10-24 MED ORDER — IOHEXOL 350 MG/ML SOLN
75.0000 mL | Freq: Once | INTRAVENOUS | Status: AC | PRN
  Administered 2023-10-24: 75 mL via INTRAVENOUS

## 2023-10-24 MED ORDER — METOPROLOL TARTRATE 5 MG/5ML IV SOLN
10.0000 mg | Freq: Once | INTRAVENOUS | Status: AC | PRN
  Administered 2023-10-24: 10 mg via INTRAVENOUS

## 2023-10-24 MED ORDER — SODIUM CHLORIDE 0.9 % IV SOLN: INTRAVENOUS | Status: DC

## 2023-10-24 MED ORDER — NITROGLYCERIN 0.4 MG SL SUBL
0.8000 mg | SUBLINGUAL_TABLET | Freq: Once | SUBLINGUAL | Status: AC
Start: 2023-10-24 — End: 2023-10-24
  Administered 2023-10-24: 0.8 mg via SUBLINGUAL

## 2023-10-24 NOTE — Progress Notes (Signed)
Patient tolerated procedure well. Ambulate w/o difficulty. Denies light headedness or being dizzy. Drinking water provided. Encouraged to drink extra water today and reasoning explained. Verbalized understanding. All questions answered. ABC intact. No further needs. Discharge from procedure area w/o issues.

## 2023-10-25 ENCOUNTER — Other Ambulatory Visit: Payer: Self-pay | Admitting: Family Medicine

## 2023-10-25 DIAGNOSIS — R072 Precordial pain: Secondary | ICD-10-CM

## 2023-11-06 ENCOUNTER — Ambulatory Visit: Payer: Managed Care, Other (non HMO) | Admitting: Dermatology

## 2023-11-07 ENCOUNTER — Other Ambulatory Visit: Payer: Self-pay | Admitting: Family Medicine

## 2023-11-07 DIAGNOSIS — M109 Gout, unspecified: Secondary | ICD-10-CM

## 2023-11-15 ENCOUNTER — Other Ambulatory Visit: Payer: Self-pay | Admitting: Family Medicine

## 2023-11-15 DIAGNOSIS — R072 Precordial pain: Secondary | ICD-10-CM

## 2023-11-15 DIAGNOSIS — R9389 Abnormal findings on diagnostic imaging of other specified body structures: Secondary | ICD-10-CM

## 2023-11-18 ENCOUNTER — Telehealth: Payer: Self-pay | Admitting: *Deleted

## 2023-11-18 NOTE — Telephone Encounter (Signed)
Cardiology appt set 01/10/24. FYI

## 2023-11-18 NOTE — Telephone Encounter (Signed)
Patient returned call and he has been notified already regarding his results and referral: Please contact patient with results   You CT coronary calcium score was high and we need to send you to cardiologist. Referral is going to be placed now and someone will contact you next week  Patient has appointment 01/10/24- please let PCP know- I case she is not happy with that date

## 2023-11-21 ENCOUNTER — Telehealth: Payer: Self-pay

## 2023-11-21 NOTE — Telephone Encounter (Signed)
The patient is scheduled to see Fernando Ducking, NP on 11-25-2023 as a new patient. Called and left a voice message to inquire about any previous cardiac history. Requested the patient to call back at their earliest convenience.

## 2023-11-22 NOTE — Telephone Encounter (Signed)
Spoke with the patient and the patient states that he has not been previously seen by a cardiologist in the past.

## 2023-11-22 NOTE — Telephone Encounter (Signed)
Patient is returning call. Transferred to [place], CMA.

## 2023-11-25 ENCOUNTER — Encounter: Payer: Self-pay | Admitting: Nurse Practitioner

## 2023-11-25 ENCOUNTER — Ambulatory Visit: Payer: Managed Care, Other (non HMO) | Attending: Nurse Practitioner | Admitting: Nurse Practitioner

## 2023-11-25 VITALS — BP 132/74 | HR 88 | Ht 67.0 in | Wt 197.6 lb

## 2023-11-25 DIAGNOSIS — E785 Hyperlipidemia, unspecified: Secondary | ICD-10-CM | POA: Diagnosis not present

## 2023-11-25 DIAGNOSIS — I10 Essential (primary) hypertension: Secondary | ICD-10-CM

## 2023-11-25 DIAGNOSIS — R072 Precordial pain: Secondary | ICD-10-CM | POA: Diagnosis not present

## 2023-11-25 DIAGNOSIS — I25110 Atherosclerotic heart disease of native coronary artery with unstable angina pectoris: Secondary | ICD-10-CM

## 2023-11-25 DIAGNOSIS — N1831 Chronic kidney disease, stage 3a: Secondary | ICD-10-CM

## 2023-11-25 MED ORDER — ASPIRIN 81 MG PO TBEC: 81.0000 mg | DELAYED_RELEASE_TABLET | Freq: Every day | ORAL | Status: AC

## 2023-11-25 MED ORDER — NITROGLYCERIN 0.4 MG SL SUBL: 0.4000 mg | SUBLINGUAL_TABLET | SUBLINGUAL | 3 refills | Status: AC | PRN

## 2023-11-25 NOTE — Patient Instructions (Signed)
Medication Instructions:   START: aspirin EC 81 MG tablet - Take 1 tablet (81 mg total) by mouth daily. Swallow whole  nitroGLYCERIN (NITROSTAT) 0.4 MG SL tablet - Place 1 tablet (0.4 mg total) under the tongue every 5 (five) minutes as needed for chest pain  *If you need a refill on your cardiac medications before your next appointment, please call your pharmacy*  Lab Work: - None ordered  Testing/Procedures: - None ordered  Follow-Up: At [facility], you and your health needs are our priority.  As part of our continuing mission to provide you with exceptional heart care, we have created designated Provider Care Teams.  These Care Teams include your primary Cardiologist (physician) and Advanced Practice Providers (APPs -  Physician Assistants and Nurse Practitioners) who all work together to provide you with the care you need, when you need it.  Your next appointment:   1 month(s)  Provider:   Bryan Lemma, MD or Nicolasa Ducking, NP

## 2023-11-25 NOTE — Progress Notes (Signed)
Cardiology Clinic Note   Patient Name: Fernando Hart Date of Encounter: 11/25/2023  Primary Care Provider:  Alba Cory, MD Primary Cardiologist:  Bryan Lemma, MD - new  Patient Profile    72 y.o. male with a history of CAD with recent abnormal coronary CT angiogram, hypertension, hyperlipidemia, stage III chronic kidney disease, erectile dysfunction, gout, osteoarthritis, vitamin D deficiency, and B12 deficiency, who presents for follow-up related to chest pain and CAD.  Past Medical History    Past Medical History:  Diagnosis Date   B12 deficiency    CAD (coronary artery disease)    a. 10/2023 Cor CTA: Ca2+ = 2290.  LM nl, LAD >70 (FFRct 0.87), LCX non-dom, 25p (FFR ct 0.98), RCA >70d (FFRct 0.78).   CKD (chronic kidney disease), stage III (HCC)    ED (erectile dysfunction)    Gout    Hypertension    Mixed hyperlipidemia    OA (osteoarthritis)    Vitamin D deficiency    Past Surgical History:  Procedure Laterality Date   ROOT CANAL      Allergies  Allergies  Allergen Reactions   Allopurinol Other (See Comments)    Chest pain, it has happened two separate times when he was given allopurinol    Norvasc [Amlodipine] Swelling   Shrimp (Diagnostic)    Statins     Myalgias     History of Present Illness      72 y.o. y/o male  hypertension, hyperlipidemia, stage III chronic kidney disease, erectile dysfunction, gout, osteoarthritis, vitamin D deficiency, and B12 deficiency.  He has no prior cardiac history and notes that his parents live long and healthy lives.  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  Activity is otherwise limited due to significant osteoarthritis of both of his knees and right hip.  He uses a rolling walker for longer distance activities but does not routinely exercise.  He has chronic dyspnea on exertion, which he attributes to deconditioning.     Due to gout, Mr. [name] was placed on  allopurinol therapy in approximately September.  Shortly thereafter, he started noticing substernal chest discomfort without associated symptoms occurring on a daily basis, lasting minutes to hours at a time, and resolving spontaneously.  He was at that point, that he remember the last time he tried allopurinol, it resulted in similar symptoms, and he contacted his primary care provider, and allopurinol was discontinued.  He has not had any recurrence of discomfort at rest.  Because of his symptoms and risk factors however, coronary CT angiogram was undertaken and was notable for severe LAD and distal RCA stenoses, along with mild circumflex disease.  FFRct was normal in the LAD and circumflex though suggestive of severe stenosis in the distal RCA at 0.78.  Today, patient notes that he may have slowed his activity some since September and an effort to avoid symptoms, but denies experiencing any symptoms since coming off of allopurinol.  He does have chronic, mild lower extremity swelling that is worse at the end of the day.  He lives alone and says he eats mostly convenience type meals which she recognizes a very high in salt.  He denies palpitations, PND, orthopnea, dizziness, syncope, or early satiety. Objective  Home Medications    Current Outpatient Medications  Medication Sig Dispense Refill   aspirin EC 81 MG tablet Take 1 tablet (81 mg total) by mouth daily. Swallow whole.  Bempedoic Acid-Ezetimibe (NEXLIZET) 180-10 MG TABS Take 1 tablet by mouth daily at 12 noon. 90 tablet 1   Berberine Chloride 500 MG CAPS Take by mouth.     Cholecalciferol (VITAMIN D3) 50 MCG (2000 UT) capsule Take 1 capsule (2,000 Units total) by mouth daily. 100 capsule 1   colchicine 0.6 MG tablet TAKE 1 TABLET BY MOUTH DAILY .  MAY REPEAT IN 1 HOUR IF NO  IMPROVEMENT. NO MORE THAN 2 IN  24 HOURS 30 tablet 0   cyanocobalamin (VITAMIN B12) 500 MCG tablet Take 500 mcg by mouth daily.     diclofenac Sodium (VOLTAREN) 1 % GEL  APPLY 2 GRAMS TOPICALLY TO AFFECTED AREA FOUR TIMES DAILY 300 g 1   EPINEPHrine 0.3 mg/0.3 mL IJ SOAJ injection Inject 0.3 mg into the muscle as needed for anaphylaxis. 1 each 0   ivabradine (CORLANOR) 7.5 MG TABS tablet Take tablet (15mg ) TWO hours prior to your cardiac CT scan. 2 tablet 0   metoprolol tartrate (LOPRESSOR) 100 MG tablet Take tablet by mouth TWO hours prior to your cardiac CT scan. 1 tablet 0   nitroGLYCERIN (NITROSTAT) 0.4 MG SL tablet Place 1 tablet (0.4 mg total) under the tongue every 5 (five) minutes as needed for chest pain. 45 tablet 3   olmesartan-hydrochlorothiazide (BENICAR HCT) 40-25 MG tablet Take 1 tablet by mouth daily. 90 tablet 1   tadalafil (CIALIS) 5 MG tablet Take 1 tablet (5 mg total) by mouth daily. 90 tablet 1   No current facility-administered medications for this visit.     Family History    Family History  Problem Relation Age of Onset   Hypertension Mother        Died in her late 42s   Diabetes Father        Died at 68   Diabetes Sister    GI Bleed Daughter    He indicated that his mother is deceased. He indicated that his father is deceased. He indicated that his sister is alive. He indicated that both of his brothers are alive. He indicated that his maternal grandmother is deceased. He indicated that his maternal grandfather is deceased. He indicated that his paternal grandmother is deceased. He indicated that his paternal grandfather is deceased. He indicated that two of his three daughters are alive.   Social History    Social History   Socioeconomic History   Marital status: Divorced    Spouse name: Not on file   Number of children: 3   Years of education: Not on file   Highest education level: Associate degree: occupational, Scientist, product/process development, or vocational program  Occupational History   Occupation: IT   Tobacco Use   Smoking status: Former    Current packs/day: 0.00    Average packs/day: 1 pack/day for 35.0 years (35.0 ttl pk-yrs)     Types: Cigarettes    Start date: 07/1969    Quit date: 07/2004    Years since quitting: 19.3   Smokeless tobacco: Never  Vaping Use   Vaping status: Never Used  Substance and Sexual Activity   Alcohol use: Yes    Alcohol/week: 1.0 standard drink of alcohol    Types: 1 Shots of liquor per week    Comment: not every week   Drug use: Yes   Sexual activity: Yes    Partners: Female    Comment: partner is post-menopausal  Other Topics Concern   Not on file  Social History Narrative   Divorced many years ago,  dating same person for 25 years she lives in [place] but they thinking about getting married   He had  3 grown children, youngest daughter died suddenly at age 76 from unknown at her gym .    8 grandchildren.    Two daughters living in [place]   Social Determinants of Health   Financial Resource Strain: Low Risk  (09/29/2023)   Overall Financial Resource Strain (CARDIA)    Difficulty of Paying Living Expenses: Not hard at all  Food Insecurity: No Food Insecurity (09/29/2023)   Hunger Vital Sign    Worried About Running Out of Food in the Last Year: Never true    Ran Out of Food in the Last Year: Never true  Transportation Needs: No Transportation Needs (09/29/2023)   PRAPARE - Administrator, Civil Service (Medical): No    Lack of Transportation (Non-Medical): No  Physical Activity: Inactive (09/29/2023)   Exercise Vital Sign    Days of Exercise per Week: 0 days    Minutes of Exercise per Session: 20 min  Stress: No Stress Concern Present (09/29/2023)   [employer] of Occupational Health - Occupational Stress Questionnaire    Feeling of Stress : Only a little  Social Connections: Socially Isolated (09/29/2023)   Social Connection and Isolation Panel [NHANES]    Frequency of Communication with Friends and Family: More than three times a week    Frequency of Social Gatherings with Friends and Family: Once a week    Attends Religious Services: Never    Loss adjuster, chartered or Organizations: No    Attends Banker Meetings: Never    Marital Status: Divorced  Catering manager Violence: Not At Risk (02/06/2023)   Humiliation, Afraid, Rape, and Kick questionnaire    Fear of Current or Ex-Partner: No    Emotionally Abused: No    Physically Abused: No    Sexually Abused: No     Review of Systems    General:  No chills, fever, night sweats or weight changes.  Cardiovascular:  +++ chest pain, +++ dyspnea on exertion, edema, orthopnea, palpitations, paroxysmal nocturnal dyspnea. Dermatological: No rash, lesions/masses Respiratory: No cough, +++ dyspnea Urologic: No hematuria, dysuria Abdominal:   No nausea, vomiting, diarrhea, bright red blood per rectum, melena, or hematemesis Neurologic:  No visual changes, wkns, changes in mental status. All other systems reviewed and are otherwise negative except as noted above. MSK: +++ Bilateral knee and hip pain/arthritis.     Physical Exam    VS:  BP 132/74 (BP Location: Left Arm, Patient Position: Sitting, Cuff Size: Normal)   Pulse 88   Ht 5\' 7"  (1.702 m)   Wt 197 lb 9.6 oz (89.6 kg)   SpO2 96%   BMI 30.95 kg/m  , BMI Body mass index is 30.95 kg/m.     GEN: Well nourished, well developed, in no acute distress. HEENT: normal. Neck: Supple, no JVD, carotid bruits, or masses. Cardiac: RRR, no murmurs, rubs, or gallops. No clubbing, cyanosis, edema.  Radials 2+/PT 2+ and equal bilaterally.  Respiratory:  Respirations regular and unlabored, clear to auscultation bilaterally. GI: Soft, nontender, nondistended, BS + x 4. MS: no deformity or atrophy. Skin: warm and dry, no rash. Neuro:  Strength and sensation are intact. Psych: Normal affect.  Accessory Clinical Findings    ECG personally reviewed by me today- EKG Interpretation Date/Time:  Monday November 25 2023 10:22:06 EST Ventricular Rate:  88 PR Interval:  146 QRS Duration:  98  QT Interval:  352 QTC Calculation: 425 R  Axis:   59  Text Interpretation: Normal sinus rhythm Normal ECG Confirmed by Nicolasa Ducking [phone]) on 11/25/2023 10:36:11 AM  - No acute changes  Lab Results  Component Value Date   WBC 4.9 12/28/2022   HGB 15.2 12/28/2022   HCT 44.8 12/28/2022   MCV 84 12/28/2022   PLT 192 12/28/2022   Lab Results  Component Value Date   CREATININE 1.40 (H) 10/24/2023   BUN 12 12/28/2022   NA 142 12/28/2022   K 4.2 12/28/2022   CL 104 12/28/2022   CO2 23 12/28/2022   Lab Results  Component Value Date   ALT 27 12/28/2022   AST 27 12/28/2022   ALKPHOS 84 12/28/2022   BILITOT 0.6 12/28/2022   Lab Results  Component Value Date   CHOL 117 07/10/2023   HDL 35 07/10/2023   LDLCALC 65 07/10/2023   TRIG 86 07/10/2023   CHOLHDL 4.3 12/28/2022    Lab Results  Component Value Date   HGBA1C 6.2 07/10/2023      Assessment & Plan   1.  Precordial chest pain/coronary artery disease: Mr. Paduano has a long history of dyspnea on exertion in the setting of deconditioning.  Patient developed chest discomfort in September, largely occurring at rest, and sometimes lasting for prolonged periods of time.  He subsequently associated this with initiation of allopurinol therapy.  Following discontinuation of allopurinol, symptoms have resolved.  He underwent coronary CT angiogram which showed a calcium score of 2290 along with greater than 70% stenoses in the LAD and right coronary artery with abnormal FFRct in the distal right coronary artery.  We discussed his CT results in detail today.  ECG unremarkable.  I am adding aspirin 81 mg daily.  He is statin intolerant and recently started bempedoic acid and Zetia.  We discussed the indications for diagnostic cardiac catheterization.  Though he wishes to proceed, he needs to speak with his daughters, who are in [place], and also arrange for transportation.  He will contact our office to provide potential dates.  I have provided him with prescription for  sublingual nitroglycerin.  2.  Primary hypertension: Stable on Benicar-HCTZ.  3.  Hyperlipidemia: Statin intolerant.  As above, on bempedoic acid and Zetia.  LDL was 86 earlier this year (prior to starting this regimen).  Thus far, he is tolerating bempedoic acid/Zetia.  Will need to consider injectable agent if he is unable to achieve LDL goal.  4.  Stage III chronic kidney disease: Creatinine was 1.4 prior to his recent coronary CT angiogram.  Plan for follow-up basic metabolic panel once his catheterization is scheduled.  5.  Disposition: Patient will contact us with potential dates for diagnostic catheterization.  He will need a CBC and basic metabolic panel prior to cath.  Plan to follow-up in the office 2 weeks post cath.  Nicolasa Ducking, NP 11/25/2023, 12:45 PM

## 2023-11-25 NOTE — H&P (View-Only) (Signed)
Cardiology Clinic Note   Patient Name: Fernando Hart Date of Encounter: 11/25/2023  Primary Care Provider:  Alba Cory, MD Primary Cardiologist:  Bryan Lemma, MD - new  Patient Profile    72 y.o. male with a history of CAD with recent abnormal coronary CT angiogram, hypertension, hyperlipidemia, stage III chronic kidney disease, erectile dysfunction, gout, osteoarthritis, vitamin D deficiency, and B12 deficiency, who presents for follow-up related to chest pain and CAD.  Past Medical History    Past Medical History:  Diagnosis Date   B12 deficiency    CAD (coronary artery disease)    a. 10/2023 Cor CTA: Ca2+ = 2290.  LM nl, LAD >70 (FFRct 0.87), LCX non-dom, 25p (FFR ct 0.98), RCA >70d (FFRct 0.78).   CKD (chronic kidney disease), stage III (HCC)    ED (erectile dysfunction)    Gout    Hypertension    Mixed hyperlipidemia    OA (osteoarthritis)    Vitamin D deficiency    Past Surgical History:  Procedure Laterality Date   ROOT CANAL      Allergies  Allergies  Allergen Reactions   Allopurinol Other (See Comments)    Chest pain, it has happened two separate times when he was given allopurinol    Norvasc [Amlodipine] Swelling   Shrimp (Diagnostic)    Statins     Myalgias     History of Present Illness      72 y.o. y/o male  hypertension, hyperlipidemia, stage III chronic kidney disease, erectile dysfunction, gout, osteoarthritis, vitamin D deficiency, and B12 deficiency.  He has no prior cardiac history and notes that his parents live long and healthy lives.  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  Activity is otherwise limited due to significant osteoarthritis of both of his knees and right hip.  He uses a rolling walker for longer distance activities but does not routinely exercise.  He has chronic dyspnea on exertion, which he attributes to deconditioning.     Due to gout, Mr. Sanseverino was placed on  allopurinol therapy in approximately September.  Shortly thereafter, he started noticing substernal chest discomfort without associated symptoms occurring on a daily basis, lasting minutes to hours at a time, and resolving spontaneously.  He was at that point, that he remember the last time he tried allopurinol, it resulted in similar symptoms, and he contacted his primary care provider, and allopurinol was discontinued.  He has not had any recurrence of discomfort at rest.  Because of his symptoms and risk factors however, coronary CT angiogram was undertaken and was notable for severe LAD and distal RCA stenoses, along with mild circumflex disease.  FFRct was normal in the LAD and circumflex though suggestive of severe stenosis in the distal RCA at 0.78.  Today, patient notes that he may have slowed his activity some since September and an effort to avoid symptoms, but denies experiencing any symptoms since coming off of allopurinol.  He does have chronic, mild lower extremity swelling that is worse at the end of the day.  He lives alone and says he eats mostly convenience type meals which she recognizes a very high in salt.  He denies palpitations, PND, orthopnea, dizziness, syncope, or early satiety. Objective  Home Medications    Current Outpatient Medications  Medication Sig Dispense Refill   aspirin EC 81 MG tablet Take 1 tablet (81 mg total) by mouth daily. Swallow whole.  Bempedoic Acid-Ezetimibe (NEXLIZET) 180-10 MG TABS Take 1 tablet by mouth daily at 12 noon. 90 tablet 1   Berberine Chloride 500 MG CAPS Take by mouth.     Cholecalciferol (VITAMIN D3) 50 MCG (2000 UT) capsule Take 1 capsule (2,000 Units total) by mouth daily. 100 capsule 1   colchicine 0.6 MG tablet TAKE 1 TABLET BY MOUTH DAILY .  MAY REPEAT IN 1 HOUR IF NO  IMPROVEMENT. NO MORE THAN 2 IN  24 HOURS 30 tablet 0   cyanocobalamin (VITAMIN B12) 500 MCG tablet Take 500 mcg by mouth daily.     diclofenac Sodium (VOLTAREN) 1 % GEL  APPLY 2 GRAMS TOPICALLY TO AFFECTED AREA FOUR TIMES DAILY 300 g 1   EPINEPHrine 0.3 mg/0.3 mL IJ SOAJ injection Inject 0.3 mg into the muscle as needed for anaphylaxis. 1 each 0   ivabradine (CORLANOR) 7.5 MG TABS tablet Take tablet (15mg ) TWO hours prior to your cardiac CT scan. 2 tablet 0   metoprolol tartrate (LOPRESSOR) 100 MG tablet Take tablet by mouth TWO hours prior to your cardiac CT scan. 1 tablet 0   nitroGLYCERIN (NITROSTAT) 0.4 MG SL tablet Place 1 tablet (0.4 mg total) under the tongue every 5 (five) minutes as needed for chest pain. 45 tablet 3   olmesartan-hydrochlorothiazide (BENICAR HCT) 40-25 MG tablet Take 1 tablet by mouth daily. 90 tablet 1   tadalafil (CIALIS) 5 MG tablet Take 1 tablet (5 mg total) by mouth daily. 90 tablet 1   No current facility-administered medications for this visit.     Family History    Family History  Problem Relation Age of Onset   Hypertension Mother        Died in her late 86s   Diabetes Father        Died at 69   Diabetes Sister    GI Bleed Daughter    He indicated that his mother is deceased. He indicated that his father is deceased. He indicated that his sister is alive. He indicated that both of his brothers are alive. He indicated that his maternal grandmother is deceased. He indicated that his maternal grandfather is deceased. He indicated that his paternal grandmother is deceased. He indicated that his paternal grandfather is deceased. He indicated that two of his three daughters are alive.   Social History    Social History   Socioeconomic History   Marital status: Divorced    Spouse name: Not on file   Number of children: 3   Years of education: Not on file   Highest education level: Associate degree: occupational, Scientist, product/process development, or vocational program  Occupational History   Occupation: IT   Tobacco Use   Smoking status: Former    Current packs/day: 0.00    Average packs/day: 1 pack/day for 35.0 years (35.0 ttl pk-yrs)     Types: Cigarettes    Start date: 07/1969    Quit date: 07/2004    Years since quitting: 19.3   Smokeless tobacco: Never  Vaping Use   Vaping status: Never Used  Substance and Sexual Activity   Alcohol use: Yes    Alcohol/week: 1.0 standard drink of alcohol    Types: 1 Shots of liquor per week    Comment: not every week   Drug use: Yes   Sexual activity: Yes    Partners: Female    Comment: partner is post-menopausal  Other Topics Concern   Not on file  Social History Narrative   Divorced many years ago,  dating same person for 25 years she lives in [place] but they thinking about getting married   He had  3 grown children, youngest daughter died suddenly at age 81 from unknown at her gym .    8 grandchildren.    Two daughters living in [place]   Social Determinants of Health   Financial Resource Strain: Low Risk  (09/29/2023)   Overall Financial Resource Strain (CARDIA)    Difficulty of Paying Living Expenses: Not hard at all  Food Insecurity: No Food Insecurity (09/29/2023)   Hunger Vital Sign    Worried About Running Out of Food in the Last Year: Never true    Ran Out of Food in the Last Year: Never true  Transportation Needs: No Transportation Needs (09/29/2023)   PRAPARE - Administrator, Civil Service (Medical): No    Lack of Transportation (Non-Medical): No  Physical Activity: Inactive (09/29/2023)   Exercise Vital Sign    Days of Exercise per Week: 0 days    Minutes of Exercise per Session: 20 min  Stress: No Stress Concern Present (09/29/2023)   [employer] of Occupational Health - Occupational Stress Questionnaire    Feeling of Stress : Only a little  Social Connections: Socially Isolated (09/29/2023)   Social Connection and Isolation Panel [NHANES]    Frequency of Communication with Friends and Family: More than three times a week    Frequency of Social Gatherings with Friends and Family: Once a week    Attends Religious Services: Never    Loss adjuster, chartered or Organizations: No    Attends Banker Meetings: Never    Marital Status: Divorced  Catering manager Violence: Not At Risk (02/06/2023)   Humiliation, Afraid, Rape, and Kick questionnaire    Fear of Current or Ex-Partner: No    Emotionally Abused: No    Physically Abused: No    Sexually Abused: No     Review of Systems    General:  No chills, fever, night sweats or weight changes.  Cardiovascular:  +++ chest pain, +++ dyspnea on exertion, edema, orthopnea, palpitations, paroxysmal nocturnal dyspnea. Dermatological: No rash, lesions/masses Respiratory: No cough, +++ dyspnea Urologic: No hematuria, dysuria Abdominal:   No nausea, vomiting, diarrhea, bright red blood per rectum, melena, or hematemesis Neurologic:  No visual changes, wkns, changes in mental status. All other systems reviewed and are otherwise negative except as noted above. MSK: +++ Bilateral knee and hip pain/arthritis.     Physical Exam    VS:  BP 132/74 (BP Location: Left Arm, Patient Position: Sitting, Cuff Size: Normal)   Pulse 88   Ht 5\' 7"  (1.702 m)   Wt 197 lb 9.6 oz (89.6 kg)   SpO2 96%   BMI 30.95 kg/m  , BMI Body mass index is 30.95 kg/m.     GEN: Well nourished, well developed, in no acute distress. HEENT: normal. Neck: Supple, no JVD, carotid bruits, or masses. Cardiac: RRR, no murmurs, rubs, or gallops. No clubbing, cyanosis, edema.  Radials 2+/PT 2+ and equal bilaterally.  Respiratory:  Respirations regular and unlabored, clear to auscultation bilaterally. GI: Soft, nontender, nondistended, BS + x 4. MS: no deformity or atrophy. Skin: warm and dry, no rash. Neuro:  Strength and sensation are intact. Psych: Normal affect.  Accessory Clinical Findings    ECG personally reviewed by me today- EKG Interpretation Date/Time:  Monday November 25 2023 10:22:06 EST Ventricular Rate:  88 PR Interval:  146 QRS Duration:  98  QT Interval:  352 QTC Calculation: 425 R  Axis:   59  Text Interpretation: Normal sinus rhythm Normal ECG Confirmed by Nicolasa Ducking [phone]) on 11/25/2023 10:36:11 AM  - No acute changes  Lab Results  Component Value Date   WBC 4.9 12/28/2022   HGB 15.2 12/28/2022   HCT 44.8 12/28/2022   MCV 84 12/28/2022   PLT 192 12/28/2022   Lab Results  Component Value Date   CREATININE 1.40 (H) 10/24/2023   BUN 12 12/28/2022   NA 142 12/28/2022   K 4.2 12/28/2022   CL 104 12/28/2022   CO2 23 12/28/2022   Lab Results  Component Value Date   ALT 27 12/28/2022   AST 27 12/28/2022   ALKPHOS 84 12/28/2022   BILITOT 0.6 12/28/2022   Lab Results  Component Value Date   CHOL 117 07/10/2023   HDL 35 07/10/2023   LDLCALC 65 07/10/2023   TRIG 86 07/10/2023   CHOLHDL 4.3 12/28/2022    Lab Results  Component Value Date   HGBA1C 6.2 07/10/2023      Assessment & Plan   1.  Precordial chest pain/coronary artery disease: Mr. Jenson has a long history of dyspnea on exertion in the setting of deconditioning.  Patient developed chest discomfort in September, largely occurring at rest, and sometimes lasting for prolonged periods of time.  He subsequently associated this with initiation of allopurinol therapy.  Following discontinuation of allopurinol, symptoms have resolved.  He underwent coronary CT angiogram which showed a calcium score of 2290 along with greater than 70% stenoses in the LAD and right coronary artery with abnormal FFRct in the distal right coronary artery.  We discussed his CT results in detail today.  ECG unremarkable.  I am adding aspirin 81 mg daily.  He is statin intolerant and recently started bempedoic acid and Zetia.  We discussed the indications for diagnostic cardiac catheterization.  The patient understands that risks include but are not limited to stroke (1 in 1000), death (1 in 1000), kidney failure [usually temporary] (1 in 500), bleeding (1 in 200), allergic reaction [possibly serious] (1 in 200), and agrees  to proceed.     Though he wishes to proceed, he needs to speak with his daughters, who are in [place], and also arrange for transportation.  He will contact our office to provide potential dates.  I have provided him with prescription for sublingual nitroglycerin.  2.  Primary hypertension: Stable on Benicar-HCTZ.  3.  Hyperlipidemia: Statin intolerant.  As above, on bempedoic acid and Zetia.  LDL was 86 earlier this year (prior to starting this regimen).  Thus far, he is tolerating bempedoic acid/Zetia.  Will need to consider injectable agent if he is unable to achieve LDL goal.  4.  Stage III chronic kidney disease: Creatinine was 1.4 prior to his recent coronary CT angiogram.  Plan for follow-up basic metabolic panel once his catheterization is scheduled.  5.  Disposition: Patient will contact us with potential dates for diagnostic catheterization.  He will need a CBC and basic metabolic panel prior to cath.  Plan to follow-up in the office 2 weeks post cath.  Nicolasa Ducking, NP 11/25/2023, 12:45 PM  ADDENDUM:  After discussing with family, patient wishes to proceed with diagnostic catheterization.  He is scheduled with Dr. Okey Dupre on 12/06/2023.

## 2023-11-26 ENCOUNTER — Other Ambulatory Visit: Payer: Self-pay | Admitting: Emergency Medicine

## 2023-11-26 ENCOUNTER — Telehealth: Payer: Self-pay | Admitting: Nurse Practitioner

## 2023-11-26 DIAGNOSIS — I25110 Atherosclerotic heart disease of native coronary artery with unstable angina pectoris: Secondary | ICD-10-CM

## 2023-11-26 NOTE — Telephone Encounter (Signed)
Please proceed with scheduling and let me know date and provider so that I can place orders.  He will need a CBC and BMET prior to his cath.

## 2023-11-26 NOTE — Telephone Encounter (Signed)
Patient called again to follow-up on getting his procedure scheduled.  Patient stated he could be available the week of the 12/16 and would prefer 12/20.

## 2023-11-26 NOTE — Telephone Encounter (Signed)
Patient states during appointment yesterday he discussed having a heart cath and was advised to call back if he is interested. He states he would like to proceed with scheduling.

## 2023-11-26 NOTE — Telephone Encounter (Signed)
Pt called and left heart cath scheduled for 12/06/23 at 0900  Instructions sent to [facility] ordered

## 2023-11-27 NOTE — Addendum Note (Signed)
Addended by: Ok Anis on: 11/27/2023 07:39 AM   Modules accepted: Orders

## 2023-11-29 LAB — HM DIABETES EYE EXAM

## 2023-12-02 ENCOUNTER — Telehealth: Payer: Self-pay | Admitting: Emergency Medicine

## 2023-12-02 NOTE — Telephone Encounter (Signed)
Pt called for change of show time to cath on 20 Dec to 0730.  Pt aware and his daughter will be driving him. All questions answered

## 2023-12-05 ENCOUNTER — Encounter: Payer: Self-pay | Admitting: Internal Medicine

## 2023-12-06 ENCOUNTER — Encounter: Payer: Self-pay | Admitting: Internal Medicine

## 2023-12-06 ENCOUNTER — Other Ambulatory Visit: Payer: Self-pay

## 2023-12-06 ENCOUNTER — Ambulatory Visit
Admission: RE | Admit: 2023-12-06 | Discharge: 2023-12-06 | Disposition: A | Discharge status: Home / Self Care | Payer: Managed Care, Other (non HMO) | Attending: Internal Medicine | Admitting: Internal Medicine

## 2023-12-06 ENCOUNTER — Encounter
Admission: RE | Disposition: A | Discharge status: Home / Self Care | Payer: Self-pay | Source: Home / Self Care | Attending: Internal Medicine

## 2023-12-06 DIAGNOSIS — N529 Male erectile dysfunction, unspecified: Secondary | ICD-10-CM | POA: Diagnosis not present

## 2023-12-06 DIAGNOSIS — I2510 Atherosclerotic heart disease of native coronary artery without angina pectoris: Secondary | ICD-10-CM | POA: Insufficient documentation

## 2023-12-06 DIAGNOSIS — R079 Chest pain, unspecified: Secondary | ICD-10-CM

## 2023-12-06 DIAGNOSIS — R931 Abnormal findings on diagnostic imaging of heart and coronary circulation: Secondary | ICD-10-CM | POA: Diagnosis present

## 2023-12-06 DIAGNOSIS — N183 Chronic kidney disease, stage 3 unspecified: Secondary | ICD-10-CM | POA: Diagnosis not present

## 2023-12-06 DIAGNOSIS — E538 Deficiency of other specified B group vitamins: Secondary | ICD-10-CM | POA: Insufficient documentation

## 2023-12-06 DIAGNOSIS — Z87891 Personal history of nicotine dependence: Secondary | ICD-10-CM | POA: Diagnosis not present

## 2023-12-06 DIAGNOSIS — M109 Gout, unspecified: Secondary | ICD-10-CM | POA: Insufficient documentation

## 2023-12-06 DIAGNOSIS — Z7982 Long term (current) use of aspirin: Secondary | ICD-10-CM | POA: Diagnosis not present

## 2023-12-06 DIAGNOSIS — M170 Bilateral primary osteoarthritis of knee: Secondary | ICD-10-CM | POA: Diagnosis not present

## 2023-12-06 DIAGNOSIS — I25110 Atherosclerotic heart disease of native coronary artery with unstable angina pectoris: Secondary | ICD-10-CM

## 2023-12-06 DIAGNOSIS — I2584 Coronary atherosclerosis due to calcified coronary lesion: Secondary | ICD-10-CM | POA: Insufficient documentation

## 2023-12-06 DIAGNOSIS — I129 Hypertensive chronic kidney disease with stage 1 through stage 4 chronic kidney disease, or unspecified chronic kidney disease: Secondary | ICD-10-CM | POA: Diagnosis not present

## 2023-12-06 DIAGNOSIS — E559 Vitamin D deficiency, unspecified: Secondary | ICD-10-CM | POA: Diagnosis not present

## 2023-12-06 DIAGNOSIS — R0609 Other forms of dyspnea: Secondary | ICD-10-CM | POA: Insufficient documentation

## 2023-12-06 DIAGNOSIS — E785 Hyperlipidemia, unspecified: Secondary | ICD-10-CM | POA: Diagnosis not present

## 2023-12-06 DIAGNOSIS — Z79899 Other long term (current) drug therapy: Secondary | ICD-10-CM | POA: Diagnosis not present

## 2023-12-06 HISTORY — PX: LEFT HEART CATH AND CORONARY ANGIOGRAPHY: CATH118249

## 2023-12-06 SURGERY — LEFT HEART CATH AND CORONARY ANGIOGRAPHY
Anesthesia: Moderate Sedation | Laterality: Left

## 2023-12-06 MED ORDER — MIDAZOLAM HCL 2 MG/2ML IJ SOLN
INTRAMUSCULAR | Status: AC
  Filled 2023-12-06: qty 2

## 2023-12-06 MED ORDER — HEPARIN SODIUM (PORCINE) 1000 UNIT/ML IJ SOLN
INTRAMUSCULAR | Status: DC | PRN
  Administered 2023-12-06: 4500 [IU] via INTRAVENOUS

## 2023-12-06 MED ORDER — FENTANYL CITRATE (PF) 100 MCG/2ML IJ SOLN
INTRAMUSCULAR | Status: DC | PRN
  Administered 2023-12-06: 50 ug via INTRAVENOUS

## 2023-12-06 MED ORDER — ASPIRIN 81 MG PO CHEW: 81.0000 mg | CHEWABLE_TABLET | ORAL | Status: DC

## 2023-12-06 MED ORDER — LIDOCAINE HCL 1 % IJ SOLN
INTRAMUSCULAR | Status: AC
  Filled 2023-12-06: qty 20

## 2023-12-06 MED ORDER — SODIUM CHLORIDE 0.9 % WEIGHT BASED INFUSION
3.0000 mL/kg/h | INTRAVENOUS | Status: AC
  Administered 2023-12-06: 3 mL/kg/h via INTRAVENOUS

## 2023-12-06 MED ORDER — FENTANYL CITRATE (PF) 100 MCG/2ML IJ SOLN
INTRAMUSCULAR | Status: AC
  Filled 2023-12-06: qty 2

## 2023-12-06 MED ORDER — HEPARIN (PORCINE) IN NACL 1000-0.9 UT/500ML-% IV SOLN
INTRAVENOUS | Status: AC
Start: 2023-12-06 — End: ?
  Filled 2023-12-06: qty 1000

## 2023-12-06 MED ORDER — HEPARIN (PORCINE) IN NACL 2000-0.9 UNIT/L-% IV SOLN
INTRAVENOUS | Status: DC | PRN
  Administered 2023-12-06: 1000 mL

## 2023-12-06 MED ORDER — VERAPAMIL HCL 2.5 MG/ML IV SOLN
INTRAVENOUS | Status: AC
  Filled 2023-12-06: qty 2

## 2023-12-06 MED ORDER — SODIUM CHLORIDE 0.9 % IV SOLN: 250.0000 mL | INTRAVENOUS | Status: DC | PRN

## 2023-12-06 MED ORDER — LABETALOL HCL 5 MG/ML IV SOLN: 10.0000 mg | INTRAVENOUS | Status: DC | PRN

## 2023-12-06 MED ORDER — VERAPAMIL HCL 2.5 MG/ML IV SOLN
INTRAVENOUS | Status: DC | PRN
  Administered 2023-12-06 (×2): 2.5 mg via INTRA_ARTERIAL

## 2023-12-06 MED ORDER — IOHEXOL 300 MG/ML  SOLN
INTRAMUSCULAR | Status: DC | PRN
  Administered 2023-12-06: 44 mL

## 2023-12-06 MED ORDER — ACETAMINOPHEN 325 MG PO TABS: 650.0000 mg | ORAL_TABLET | ORAL | Status: DC | PRN

## 2023-12-06 MED ORDER — HEPARIN SODIUM (PORCINE) 1000 UNIT/ML IJ SOLN
INTRAMUSCULAR | Status: AC
  Filled 2023-12-06: qty 10

## 2023-12-06 MED ORDER — LIDOCAINE HCL (PF) 1 % IJ SOLN
INTRAMUSCULAR | Status: DC | PRN
  Administered 2023-12-06: 2 mL

## 2023-12-06 MED ORDER — SODIUM CHLORIDE 0.9% FLUSH: 3.0000 mL | INTRAVENOUS | Status: DC | PRN

## 2023-12-06 MED ORDER — MIDAZOLAM HCL 2 MG/2ML IJ SOLN
INTRAMUSCULAR | Status: DC | PRN
  Administered 2023-12-06: 1 mg via INTRAVENOUS

## 2023-12-06 MED ORDER — SODIUM CHLORIDE 0.9 % WEIGHT BASED INFUSION: 1.0000 mL/kg/h | INTRAVENOUS | Status: DC

## 2023-12-06 MED ORDER — ONDANSETRON HCL 4 MG/2ML IJ SOLN
4.0000 mg | Freq: Four times a day (QID) | INTRAMUSCULAR | Status: DC | PRN
Start: 2023-12-06 — End: 2023-12-06

## 2023-12-06 MED ORDER — SODIUM CHLORIDE 0.9% FLUSH: 3.0000 mL | Freq: Two times a day (BID) | INTRAVENOUS | Status: DC

## 2023-12-06 MED ORDER — SODIUM CHLORIDE 0.9 % IV SOLN: INTRAVENOUS | Status: DC

## 2023-12-06 MED ORDER — HYDRALAZINE HCL 20 MG/ML IJ SOLN: 10.0000 mg | INTRAMUSCULAR | Status: DC | PRN

## 2023-12-06 MED ORDER — METOPROLOL SUCCINATE ER 25 MG PO TB24: 25.0000 mg | ORAL_TABLET | Freq: Every day | ORAL | 5 refills | Status: DC

## 2023-12-06 SURGICAL SUPPLY — 10 items
CATH 5FR JL3.5 JR4 ANG PIG MP (CATHETERS) IMPLANT
DEVICE RAD TR BAND REGULAR (VASCULAR PRODUCTS) IMPLANT
DRAPE BRACHIAL (DRAPES) IMPLANT
GLIDESHEATH SLEND SS 6F .021 (SHEATH) IMPLANT
GUIDEWIRE INQWIRE 1.5J.035X260 (WIRE) IMPLANT
INQWIRE 1.5J .035X260CM (WIRE) ×1
PACK CARDIAC CATH (CUSTOM PROCEDURE TRAY) ×1 IMPLANT
PROTECTION STATION PRESSURIZED (MISCELLANEOUS) ×1
SET ATX-X65L (MISCELLANEOUS) IMPLANT
STATION PROTECTION PRESSURIZED (MISCELLANEOUS) IMPLANT

## 2023-12-06 NOTE — Brief Op Note (Signed)
BRIEF CARDIAC CATHETERIZATION NOTE  DATE: 12/06/2023  TIME: 10:03 AM  PATIENT:  Fernando Hart  72 y.o. male  PRE-OPERATIVE DIAGNOSIS:  Chest pain and abnormal cardiac CTA  POST-OPERATIVE DIAGNOSIS:  Nonobstructive coronary artery disease  PROCEDURE:  Procedure(s): LEFT HEART CATH AND CORONARY ANGIOGRAPHY (Left)  SURGEON:  Surgeons and Role:    Yvonne Kendall, MD - Primary  FINDINGS: Mild to moderate, nonobstructive CAD. Normal LVEF and LVEDP.  RECOMMENDATIONS: Add metoprolol succinate for antianginal and BP therapy. Continue aggressive secondary prevention of CAD.  Yvonne Kendall, MD [facility]

## 2023-12-06 NOTE — Interval H&P Note (Signed)
History and Physical Interval Note:  12/06/2023 9:07 AM  Fernando Hart  has presented today for surgery, with the diagnosis of chest pain and abnormal cardiac CTA.  The various methods of treatment have been discussed with the patient and family. After consideration of risks, benefits and other options for treatment, the patient has consented to  Procedure(s): LEFT HEART CATH AND CORONARY ANGIOGRAPHY (Left) as a surgical intervention.  The patient's history has been reviewed, patient examined, no change in status, stable for surgery.  I have reviewed the patient's chart and labs.  Questions were answered to the patient's satisfaction.     Cath Lab Visit (complete for each Cath Lab visit)  Clinical Evaluation Leading to the Procedure:   ACS: No.  Non-ACS:    Anginal Classification: CCS I  Anti-ischemic medical therapy: No Therapy  Non-Invasive Test Results: Intermediate-risk stress test findings: cardiac mortality 1-3%/year (CT-FFR with significant RCA disease)  Prior CABG: No previous CABG  Snow Peoples

## 2023-12-13 ENCOUNTER — Telehealth: Payer: Self-pay | Admitting: Internal Medicine

## 2023-12-13 NOTE — Telephone Encounter (Signed)
Pt c/o medication issue:  1. Name of Medication:   metoprolol succinate (TOPROL XL) 25 MG 24 hr tablet    2. How are you currently taking this medication (dosage and times per day)?  Take 1 tablet (25 mg total) by mouth daily.       3. Are you having a reaction (difficulty breathing--STAT)? No  4. What is your medication issue? Pt is requesting a callback regarding him needing assistance with getting his medication from the pharmacy. He stated on 12/20 they stated it was under review, 12/22 they stated it would be ready on 12/23 and now they've told the pt they don't have a prescription for this medication at all. Pt is very frustrated with it since he just wants his medication so he'd like to be advised on what to do. Please advise

## 2023-12-13 NOTE — Telephone Encounter (Signed)
Informed patient after speaking with pharmacy staff that the medication in question metoprolol succinate (TOPROL XL) 25 MG 24 hr tablet will be ready to pick up in 90 minutes after reprocessing. Patient understood with read back

## 2023-12-30 ENCOUNTER — Encounter: Payer: Self-pay | Admitting: Family Medicine

## 2024-01-01 ENCOUNTER — Encounter: Payer: Self-pay | Admitting: Family Medicine

## 2024-01-01 ENCOUNTER — Other Ambulatory Visit: Payer: Self-pay | Admitting: Family Medicine

## 2024-01-01 LAB — COMPREHENSIVE METABOLIC PANEL
ALT: 24 [IU]/L (ref 0–44)
AST: 25 [IU]/L (ref 0–40)
Albumin: 4.3 g/dL (ref 3.8–4.8)
Alkaline Phosphatase: 72 [IU]/L (ref 44–121)
BUN/Creatinine Ratio: 12 (ref 10–24)
BUN: 19 mg/dL (ref 8–27)
Bilirubin Total: 0.6 mg/dL (ref 0.0–1.2)
CO2: 21 mmol/L (ref 20–29)
Calcium: 9.8 mg/dL (ref 8.6–10.2)
Chloride: 102 mmol/L (ref 96–106)
Creatinine, Ser: 1.54 mg/dL — ABNORMAL HIGH (ref 0.76–1.27)
Globulin, Total: 2.7 g/dL (ref 1.5–4.5)
Glucose: 102 mg/dL — ABNORMAL HIGH (ref 70–99)
Potassium: 4.3 mmol/L (ref 3.5–5.2)
Sodium: 141 mmol/L (ref 134–144)
Total Protein: 7 g/dL (ref 6.0–8.5)
eGFR: 47 mL/min/{1.73_m2} — ABNORMAL LOW (ref >59)

## 2024-01-01 LAB — CBC WITH DIFFERENTIAL/PLATELET
Basophils Absolute: 0.1 10*3/uL (ref 0.0–0.2)
Basos: 1 %
EOS (ABSOLUTE): 0.6 10*3/uL — ABNORMAL HIGH (ref 0.0–0.4)
Eos: 11 %
Hematocrit: 45.8 % (ref 37.5–51.0)
Hemoglobin: 15 g/dL (ref 13.0–17.7)
Immature Grans (Abs): 0 10*3/uL (ref 0.0–0.1)
Immature Granulocytes: 0 %
Lymphocytes Absolute: 1.8 10*3/uL (ref 0.7–3.1)
Lymphs: 35 %
MCH: 28.1 pg (ref 26.6–33.0)
MCHC: 32.8 g/dL (ref 31.5–35.7)
MCV: 86 fL (ref 79–97)
Monocytes Absolute: 0.7 10*3/uL (ref 0.1–0.9)
Monocytes: 13 %
Neutrophils Absolute: 2.1 10*3/uL (ref 1.4–7.0)
Neutrophils: 40 %
Platelets: 227 10*3/uL (ref 150–450)
RBC: 5.34 x10E6/uL (ref 4.14–5.80)
RDW: 12.1 % (ref 11.6–15.4)
WBC: 5.1 10*3/uL (ref 3.4–10.8)

## 2024-01-01 LAB — LIPID PANEL
Chol/HDL Ratio: 3.3 {ratio} (ref 0.0–5.0)
Cholesterol, Total: 110 mg/dL (ref 100–199)
HDL: 33 mg/dL — ABNORMAL LOW (ref >39)
LDL Chol Calc (NIH): 58 mg/dL (ref 0–99)
Triglycerides: 101 mg/dL (ref 0–149)
VLDL Cholesterol Cal: 19 mg/dL (ref 5–40)

## 2024-01-01 LAB — HIGH SENSITIVITY CRP: CRP, High Sensitivity: 0.35 mg/L (ref 0.00–3.00)

## 2024-01-01 LAB — B12 AND FOLATE PANEL
Folate: 7 ng/mL (ref >3.0)
Vitamin B-12: >2000 pg/mL — ABNORMAL HIGH (ref 232–1245)

## 2024-01-01 LAB — VITAMIN D 25 HYDROXY (VIT D DEFICIENCY, FRACTURES): Vit D, 25-Hydroxy: 39.7 ng/mL (ref 30.0–100.0)

## 2024-01-01 LAB — URIC ACID: Uric Acid: 9 mg/dL — ABNORMAL HIGH (ref 3.8–8.4)

## 2024-01-01 LAB — HEMOGLOBIN A1C
Est. average glucose Bld gHb Est-mCnc: 131 mg/dL
Hgb A1c MFr Bld: 6.2 % — ABNORMAL HIGH (ref 4.8–5.6)

## 2024-01-02 ENCOUNTER — Encounter: Payer: Self-pay | Admitting: Nurse Practitioner

## 2024-01-02 ENCOUNTER — Ambulatory Visit: Payer: Managed Care, Other (non HMO) | Attending: Nurse Practitioner | Admitting: Nurse Practitioner

## 2024-01-02 VITALS — BP 110/62 | HR 70 | Ht 67.0 in | Wt 198.8 lb

## 2024-01-02 DIAGNOSIS — I10 Essential (primary) hypertension: Secondary | ICD-10-CM | POA: Diagnosis not present

## 2024-01-02 DIAGNOSIS — I2510 Atherosclerotic heart disease of native coronary artery without angina pectoris: Secondary | ICD-10-CM | POA: Diagnosis not present

## 2024-01-02 DIAGNOSIS — N1831 Chronic kidney disease, stage 3a: Secondary | ICD-10-CM

## 2024-01-02 DIAGNOSIS — R072 Precordial pain: Secondary | ICD-10-CM | POA: Diagnosis not present

## 2024-01-02 DIAGNOSIS — M1A0690 Idiopathic chronic gout, unspecified knee, without tophus (tophi): Secondary | ICD-10-CM

## 2024-01-02 DIAGNOSIS — E785 Hyperlipidemia, unspecified: Secondary | ICD-10-CM

## 2024-01-02 NOTE — Progress Notes (Signed)
Office Visit    Patient Name: Fernando Hart Date of Encounter: 01/02/2024  Primary Care Provider:  Alba Cory, MD Primary Cardiologist:  Fernando Kendall, MD  Chief Complaint    73 y.o. male with a history of nonobstructive CAD, hypertension, hyperlipidemia, stage III chronic kidney disease, erectile dysfunction, gout, osteoarthritis, vitamin D deficiency, and B12 deficiency, who presents for follow-up related to chest pain and CAD.   Past Medical History  Subjective   Past Medical History:  Diagnosis Date   B12 deficiency    CAD (coronary artery disease)    a. 10/2023 Cor CTA: Ca2+ = 2290.  LM nl, LAD >70 (FFRct 0.87), LCX non-dom, 25p (FFR ct 0.98), RCA >70d (FFRct 0.78); b. 11/2023 Cath: LM nl, LAD 35p/m, 49m, LCX nl, RCA large, mild diff dzs, 50d w/ sev Ca2+.  EF 55-65%-->Med rx.   CKD (chronic kidney disease), stage III (HCC)    ED (erectile dysfunction)    Gout    Hypertension    Mixed hyperlipidemia    OA (osteoarthritis)    Vitamin D deficiency    Past Surgical History:  Procedure Laterality Date   LEFT HEART CATH AND CORONARY ANGIOGRAPHY Left 12/06/2023   Procedure: LEFT HEART CATH AND CORONARY ANGIOGRAPHY;  Surgeon: Fernando Kendall, MD;  Location: [facility];  Service: Cardiovascular;  Laterality: Left;   ROOT CANAL      Allergies  Allergies  Allergen Reactions   Allopurinol Other (See Comments)    Chest pain, it has happened two separate times when he was given allopurinol    Norvasc [Amlodipine] Swelling   Shrimp (Diagnostic) Hives   Statins     Myalgias       History of Present Illness      73 y.o. y/o male w/ a h/o non-obstructive CAD, hypertension, hyperlipidemia, stage III chronic kidney disease, erectile dysfunction, gout, osteoarthritis, vitamin D deficiency, and B12 deficiency.  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  Activity is otherwise limited due to significant  osteoarthritis of both of his knees and right hip.  He uses a rolling walker for longer distance activities but does not routinely exercise.  He has chronic dyspnea on exertion, which he attributes to deconditioning.  In September 2024, he began experiencing daily substernal chest discomfort lasting minutes to hours at a time and resolving spontaneously.  He had been placed on allopurinol shortly before symptoms began and this was subsequently discontinued without recurrence of symptoms.  He reported symptoms primary care provider underwent CT angiogram which was notable for severe LAD and distal RCA stenoses along with mild circumflex disease.  FFR CT was normal in the LAD and circumflex though suggestive of severe stenosis in the distal RCA at 0.78.  He established care here in December 2024 and subsequently underwent diagnostic catheterization which revealed moderate, nonobstructive LAD and distal RCA disease with normal LV function and LVEDP.  Medical therapy was recommended and he was placed on Toprol-XL 25 mg daily.   Since his last visit, [name] has done well.  He has tolerated the addition of metoprolol without issues.  He remains reasonably active and denies chest pain or dyspnea.  We discussed the results of his catheterization in the context of the suggested findings on coronary CTA today.  All questions answered.  He denies palpitations, PND, orthopnea, dizziness, syncope, edema, or early satiety.  He has been having significant bilateral knee pain, which she says is  typical for his gout flares.  He had lab work on the 14th showing a uric acid level of 9 with creatinine of 1.54.  He has follow-up with primary care tomorrow to address. Objective  Home Medications    Current Outpatient Medications  Medication Sig Dispense Refill   aspirin EC 81 MG tablet Take 1 tablet (81 mg total) by mouth daily. Swallow whole.     Bempedoic Acid-Ezetimibe (NEXLIZET) 180-10 MG TABS Take 1 tablet by mouth  daily at 12 noon. 90 tablet 1   Berberine Chloride 500 MG CAPS Take 500 mg by mouth 2 (two) times daily.     Cholecalciferol (VITAMIN D3) 50 MCG (2000 UT) capsule Take 1 capsule (2,000 Units total) by mouth daily. 100 capsule 1   colchicine 0.6 MG tablet TAKE 1 TABLET BY MOUTH DAILY .  MAY REPEAT IN 1 HOUR IF NO  IMPROVEMENT. NO MORE THAN 2 IN  24 HOURS 30 tablet 0   Cyanocobalamin 5000 MCG TBDP Take 5,000 mcg by mouth daily.     diclofenac Sodium (VOLTAREN) 1 % GEL APPLY 2 GRAMS TOPICALLY TO AFFECTED AREA FOUR TIMES DAILY (Patient taking differently: Apply 2 g topically daily as needed (pain).) 300 g 1   docusate sodium (COLACE) 100 MG capsule Take 100 mg by mouth daily.     EPINEPHrine 0.3 mg/0.3 mL IJ SOAJ injection Inject 0.3 mg into the muscle as needed for anaphylaxis. 1 each 0   loratadine (ALAVERT) 10 MG dissolvable tablet Take 10 mg by mouth daily as needed for allergies.     metoprolol succinate (TOPROL XL) 25 MG 24 hr tablet Take 1 tablet (25 mg total) by mouth daily. 30 tablet 5   nitroGLYCERIN (NITROSTAT) 0.4 MG SL tablet Place 1 tablet (0.4 mg total) under the tongue every 5 (five) minutes as needed for chest pain. 45 tablet 3   olmesartan-hydrochlorothiazide (BENICAR HCT) 40-25 MG tablet Take 1 tablet by mouth daily. 90 tablet 1   OVER THE COUNTER MEDICATION Apply 1 application  topically daily as needed (Leg pain). Neuropathy Cream     Polyvinyl Alcohol-Povidone PF (REFRESH) 1.4-0.6 % SOLN Place 1 drop into both eyes daily as needed (Dry eye).     tadalafil (CIALIS) 5 MG tablet Take 1 tablet (5 mg total) by mouth daily. 90 tablet 1   No current facility-administered medications for this visit.     Physical Exam    VS:  BP 110/62 (BP Location: Left Arm, Patient Position: Sitting, Cuff Size: Normal)   Pulse 70   Ht 5\' 7"  (1.702 m)   Wt 198 lb 12.8 oz (90.2 kg)   SpO2 97%   BMI 31.14 kg/m  , BMI Body mass index is 31.14 kg/m.       GEN: Well nourished, well developed, in  no acute distress. HEENT: normal. Neck: Supple, no JVD, carotid bruits, or masses. Cardiac: RRR, no murmurs, rubs, or gallops. No clubbing, cyanosis, 1+ bilateral ankle edema just above the sock line.  Radials 2+/PT 2+ and equal bilaterally.  Right radial cath site without bleeding, bruit, or hematoma. Respiratory:  Respirations regular and unlabored, clear to auscultation bilaterally. GI: Soft, nontender, nondistended, BS + x 4. MS: no deformity or atrophy. Skin: warm and dry, no rash. Neuro:  Strength and sensation are intact. Psych: Normal affect.  Accessory Clinical Findings    ECG personally reviewed by me today - EKG Interpretation Date/Time:  Thursday January 02 2024 10:18:23 EST Ventricular Rate:  70 PR Interval:  152 QRS Duration:  96 QT Interval:  368 QTC Calculation: 397 R Axis:   61  Text Interpretation: Normal sinus rhythm Normal ECG Confirmed by Nicolasa Ducking [id number]) on 01/02/2024 10:25:56 AM  - no acute changes.  Lab Results  Component Value Date   WBC 5.1 12/31/2023   HGB 15.0 12/31/2023   HCT 45.8 12/31/2023   MCV 86 12/31/2023   PLT 227 12/31/2023   Lab Results  Component Value Date   CREATININE 1.54 (H) 12/31/2023   BUN 19 12/31/2023   NA 141 12/31/2023   K 4.3 12/31/2023   CL 102 12/31/2023   CO2 21 12/31/2023   Lab Results  Component Value Date   ALT 24 12/31/2023   AST 25 12/31/2023   ALKPHOS 72 12/31/2023   BILITOT 0.6 12/31/2023   Lab Results  Component Value Date   CHOL 110 12/31/2023   HDL 33 (L) 12/31/2023   LDLCALC 58 12/31/2023   TRIG 101 12/31/2023   CHOLHDL 3.3 12/31/2023    Lab Results  Component Value Date   HGBA1C 6.2 (H) 12/31/2023   Lab Results  Component Value Date   TSH 2.400 03/13/2017       Assessment & Plan    1.  Precordial chest pain/coronary artery disease: Patient had episodes of chest pain in the setting of allopurinol therapy in September 2024.  Symptoms resolved following discontinuation of  allopurinol.  Coronary CT angiogram in November 2024 showed a calcium score of 2290 with moderate diffuse CAD and abnormal FFR CT in the distal RCA.  He subsequently underwent diagnostic catheterization in late December 2024 revealing moderate, nonobstructive CAD with a heavily calcified 50% distal RCA stenosis but no significantly flow-limiting disease or targets for intervention.  He has been medically managed with aspirin, beta-blocker, and bempedoic acid/ezetimibe in the setting of statin intolerance.  He has not had any chest pain since September.  Strongly encouraged 30 minutes of exercise daily, though this has been limited by gout and bilateral knee pain.  2.  Primary hypertension: Blood pressure is normal today at 110/62.  He has been on Benicar-HCTZ for some time and more recently, was placed on Toprol XL 25 mg daily in the setting of CAD and chest pain.  In the setting of gout with a uric acid of 9.0, would consider discontinuation of HCTZ and use of Benicar alone.  Discussed with patient today and offered to change, however, understandably, he'd like to talk it over w/ his PCP.  He does have mild ankle swelling above his sock line though notes that he typically uses compression socks to avoid this.  He plans to discuss his Benicar-HCTZ prescription with his primary care provider tomorrow.  3.  Hyperlipidemia: Statin intolerant.  He is on bempedoic acid and ezetimibe with recent LDL of 58 and normal LFTs on January 14.  He seems to be tolerating this well.  4.  Gout: Uric acid of 9 on January 14 with ongoing bilateral knee pain.  He has follow-up with primary care tomorrow.  As above, consider discontinuation of HCTZ.  Patient does not tolerate allopurinol.  5.  Stage III chronic kidney disease: Creatinine slightly elevated 1.54 in January 14.  As above, consider discontinuation of HCTZ.  Encouraged adequate hydration.  6.  Disposition: Follow-up in 6 months or sooner if  necessary.  Nicolasa Ducking, NP 01/02/2024, 10:45 AM

## 2024-01-02 NOTE — Patient Instructions (Signed)
Medication Instructions:  Your Physician recommend you continue on your current medication as directed.    *If you need a refill on your cardiac medications before your next appointment, please call your pharmacy*   Lab Work: None ordered at this time  If you have labs (blood work) drawn today and your tests are completely normal, you will receive your results only by: MyChart Message (if you have MyChart) OR A paper copy in the mail If you have any lab test that is abnormal or we need to change your treatment, we will call you to review the results.   Follow-Up: At [facility], you and your health needs are our priority.  As part of our continuing mission to provide you with exceptional heart care, we have created designated Provider Care Teams.  These Care Teams include your primary Cardiologist (physician) and Advanced Practice Providers (APPs -  Physician Assistants and Nurse Practitioners) who all work together to provide you with the care you need, when you need it.    Your next appointment:   6 month(s)  Provider:   You may see Yvonne Kendall, MD or one of the following Advanced Practice Providers on your designated Care Team:   Nicolasa Ducking, NP

## 2024-01-03 ENCOUNTER — Encounter: Payer: Self-pay | Admitting: Family Medicine

## 2024-01-03 ENCOUNTER — Ambulatory Visit: Payer: Managed Care, Other (non HMO) | Admitting: Family Medicine

## 2024-01-03 DIAGNOSIS — G720 Drug-induced myopathy: Secondary | ICD-10-CM

## 2024-01-03 DIAGNOSIS — I2510 Atherosclerotic heart disease of native coronary artery without angina pectoris: Secondary | ICD-10-CM

## 2024-01-03 DIAGNOSIS — E790 Hyperuricemia without signs of inflammatory arthritis and tophaceous disease: Secondary | ICD-10-CM

## 2024-01-03 DIAGNOSIS — E559 Vitamin D deficiency, unspecified: Secondary | ICD-10-CM

## 2024-01-03 DIAGNOSIS — N1831 Chronic kidney disease, stage 3a: Secondary | ICD-10-CM | POA: Diagnosis not present

## 2024-01-03 DIAGNOSIS — R739 Hyperglycemia, unspecified: Secondary | ICD-10-CM | POA: Diagnosis not present

## 2024-01-03 DIAGNOSIS — E785 Hyperlipidemia, unspecified: Secondary | ICD-10-CM

## 2024-01-03 DIAGNOSIS — M109 Gout, unspecified: Secondary | ICD-10-CM

## 2024-01-03 DIAGNOSIS — M25511 Pain in right shoulder: Secondary | ICD-10-CM

## 2024-01-03 DIAGNOSIS — R7989 Other specified abnormal findings of blood chemistry: Secondary | ICD-10-CM

## 2024-01-03 DIAGNOSIS — I10 Essential (primary) hypertension: Secondary | ICD-10-CM

## 2024-01-03 DIAGNOSIS — N528 Other male erectile dysfunction: Secondary | ICD-10-CM

## 2024-01-03 DIAGNOSIS — G8929 Other chronic pain: Secondary | ICD-10-CM

## 2024-01-03 MED ORDER — COLCHICINE 0.6 MG PO TABS: 0.6000 mg | ORAL_TABLET | Freq: Every day | ORAL | 0 refills | Status: DC | PRN

## 2024-01-03 MED ORDER — FEBUXOSTAT 40 MG PO TABS: 40.0000 mg | ORAL_TABLET | Freq: Every day | ORAL | 1 refills | Status: DC

## 2024-01-03 MED ORDER — DAPAGLIFLOZIN PROPANEDIOL 10 MG PO TABS: 10.0000 mg | ORAL_TABLET | Freq: Every day | ORAL | 1 refills | Status: DC

## 2024-01-03 MED ORDER — NEXLIZET 180-10 MG PO TABS: 1.0000 | ORAL_TABLET | Freq: Every day | ORAL | 1 refills | Status: DC

## 2024-01-03 MED ORDER — AMLODIPINE BESYLATE 2.5 MG PO TABS: 2.5000 mg | ORAL_TABLET | Freq: Every day | ORAL | 1 refills | Status: DC

## 2024-01-03 MED ORDER — OLMESARTAN MEDOXOMIL 40 MG PO TABS: 40.0000 mg | ORAL_TABLET | Freq: Every day | ORAL | 1 refills | Status: DC

## 2024-01-03 NOTE — Patient Instructions (Signed)
Referral has been sent to [facility].  P: [id number] F: [phone]

## 2024-01-03 NOTE — Progress Notes (Signed)
Name: Fernando Hart   MRN: [id number]    DOB: 02-12-1951   Date:01/03/2024       Progress Note  Subjective  Chief Complaint  Chief Complaint  Patient presents with   Medical Management of Chronic Issues   HPI   HTN: he is doing well on current regiment, taking Benicar 40/25 mg daily and recently started on Metoprolol by cardiologist. BP is at goal   CAD: he had chest pain shortly after starting allopurinol in October, he was since than referred to cardiologist and found to have CAD , he is on medical management. No chest pain, sob or palpitation since first episode   CKI stage III: he is aware that he needs to avoid NSAID's, he takes ARB. GFR stable.    B12 deficiency/Vitamin D deficiency : taking supplementation, b12 is a little high and can back down to a few times a week, vitamin D is at goal    Hyperlipidemia: he used to take Crestor occasionally but once he started to take it daily a couple of months ago he developed severe muscle pain and stopped crestor,  we tried Pravastatin and caused same symptoms , he has CAD, tolerating Nexlizet and last LDL was down to 58 , HDL is still low/discussed importance of increase in physical activity , fish and tree nuts in his diet   Hyperglycemia: He denies polyphagia, polydipsia or polyuria.He stopped Metformin because it made him feel fatigued. A1C has been stable at 6.2 %    ED: she has difficulty starting an erection, able to maintain , discussed medication. Stable   Gout: he stopped Uloric on his own months ago , no recent episodes of gout He went on Allopurinol and it caused chest pain so he stopped. Discussed resuming Uloric today since uric acid of 9   Chronic right shoulder pain: going on for over one year, getting progressive worse and causing decrease rom, specially abduction and asked to see Ortho  Patient Active Problem List   Diagnosis Date Noted   Chest pain 12/06/2023   Abnormal cardiac CT angiography 12/06/2023   Stage 3a  chronic kidney disease (HCC) 11/04/2020   Vitamin D deficiency 11/04/2020   Intermittent low back pain 03/24/2018   Tinnitus of right ear 07/12/2017   Dyslipidemia 10/11/2016   Obesity 10/11/2016   Degenerative joint disease of right hip 02/28/2016   Prediabetes 01/10/2016   Controlled gout 01/10/2016   Essential hypertension 01/10/2016    Past Surgical History:  Procedure Laterality Date   LEFT HEART CATH AND CORONARY ANGIOGRAPHY Left 12/06/2023   Procedure: LEFT HEART CATH AND CORONARY ANGIOGRAPHY;  Surgeon: Yvonne Kendall, MD;  Location: [facility];  Service: Cardiovascular;  Laterality: Left;   ROOT CANAL      Family History  Problem Relation Age of Onset   Hypertension Mother        Died in her late 60s   Diabetes Father        Died at 62   Diabetes Sister    GI Bleed Daughter     Social History   Tobacco Use   Smoking status: Former    Current packs/day: 0.00    Average packs/day: 1 pack/day for 35.0 years (35.0 ttl pk-yrs)    Types: Cigarettes    Start date: 07/1969    Quit date: 07/2004    Years since quitting: 19.4   Smokeless tobacco: Never  Substance Use Topics   Alcohol use: Yes    Alcohol/week: 1.0 standard  drink of alcohol    Types: 1 Shots of liquor per week    Comment: not every week     Current Outpatient Medications:    aspirin EC 81 MG tablet, Take 1 tablet (81 mg total) by mouth daily. Swallow whole., Disp: , Rfl:    Bempedoic Acid-Ezetimibe (NEXLIZET) 180-10 MG TABS, Take 1 tablet by mouth daily at 12 noon., Disp: 90 tablet, Rfl: 1   Berberine Chloride 500 MG CAPS, Take 500 mg by mouth 2 (two) times daily., Disp: , Rfl:    Cholecalciferol (VITAMIN D3) 50 MCG (2000 UT) capsule, Take 1 capsule (2,000 Units total) by mouth daily., Disp: 100 capsule, Rfl: 1   colchicine 0.6 MG tablet, TAKE 1 TABLET BY MOUTH DAILY .  MAY REPEAT IN 1 HOUR IF NO  IMPROVEMENT. NO MORE THAN 2 IN  24 HOURS, Disp: 30 tablet, Rfl: 0   Cyanocobalamin 5000 MCG  TBDP, Take 5,000 mcg by mouth daily., Disp: , Rfl:    diclofenac Sodium (VOLTAREN) 1 % GEL, APPLY 2 GRAMS TOPICALLY TO AFFECTED AREA FOUR TIMES DAILY (Patient taking differently: Apply 2 g topically daily as needed (pain).), Disp: 300 g, Rfl: 1   docusate sodium (COLACE) 100 MG capsule, Take 100 mg by mouth daily., Disp: , Rfl:    EPINEPHrine 0.3 mg/0.3 mL IJ SOAJ injection, Inject 0.3 mg into the muscle as needed for anaphylaxis., Disp: 1 each, Rfl: 0   loratadine (ALAVERT) 10 MG dissolvable tablet, Take 10 mg by mouth daily as needed for allergies., Disp: , Rfl:    metoprolol succinate (TOPROL XL) 25 MG 24 hr tablet, Take 1 tablet (25 mg total) by mouth daily., Disp: 30 tablet, Rfl: 5   nitroGLYCERIN (NITROSTAT) 0.4 MG SL tablet, Place 1 tablet (0.4 mg total) under the tongue every 5 (five) minutes as needed for chest pain., Disp: 45 tablet, Rfl: 3   olmesartan-hydrochlorothiazide (BENICAR HCT) 40-25 MG tablet, Take 1 tablet by mouth daily., Disp: 90 tablet, Rfl: 1   OVER THE COUNTER MEDICATION, Apply 1 application  topically daily as needed (Leg pain). Neuropathy Cream, Disp: , Rfl:    Polyvinyl Alcohol-Povidone PF (REFRESH) 1.4-0.6 % SOLN, Place 1 drop into both eyes daily as needed (Dry eye)., Disp: , Rfl:    tadalafil (CIALIS) 5 MG tablet, Take 1 tablet (5 mg total) by mouth daily., Disp: 90 tablet, Rfl: 1  Allergies  Allergen Reactions   Allopurinol Other (See Comments)    Chest pain, it has happened two separate times when he was given allopurinol    Norvasc [Amlodipine] Swelling   Shrimp (Diagnostic) Hives   Statins     Myalgias     I personally reviewed active problem list, medication list, allergies, family history with the patient/caregiver today.   ROS  Ten systems reviewed and is negative except as mentioned in HPI    Objective  Vitals:   01/03/24 0817  BP: 124/68  Pulse: 91  Resp: 16  Temp: 98.6 F (37 C)  TempSrc: Oral  SpO2: 96%  Weight: 194 lb 12.8 oz (88.4  kg)  Height: 5\' 7"  (1.702 m)    Body mass index is 30.51 kg/m.  Physical Exam  Constitutional: Patient appears well-developed and well-nourished. Obese  No distress.  HEENT: head atraumatic, normocephalic, pupils equal and reactive to light, neck supple Cardiovascular: Normal rate, regular rhythm and normal heart sounds.  No murmur heard. No BLE edema. Pulmonary/Chest: Effort normal and breath sounds normal. No respiratory distress. Abdominal: Soft.  There is no tenderness. Muscular skeletal: pain with abduction of right shoulder Psychiatric: Patient has a normal mood and affect. behavior is normal. Judgment and thought content normal.   Recent Results (from the past 2160 hours)  Fecal occult blood, imunochemical     Status: None   Collection Time: 10/10/23 12:00 AM  Result Value Ref Range   IFOBT Negative     Comment: [facility]  I-STAT creatinine     Status: Abnormal   Collection Time: 10/24/23  9:42 AM  Result Value Ref Range   Creatinine, Ser 1.40 (H) 0.61 - 1.24 mg/dL  HM DIABETES EYE EXAM     Status: None   Collection Time: 11/29/23  2:12 PM  Result Value Ref Range   HM Diabetic Eye Exam No Retinopathy No Retinopathy    Comment: ABSTRACTED BY HIM  CBC with Differential/Platelet     Status: Abnormal   Collection Time: 12/31/23  9:17 AM  Result Value Ref Range   WBC 5.1 3.4 - 10.8 x10E3/uL   RBC 5.34 4.14 - 5.80 x10E6/uL   Hemoglobin 15.0 13.0 - 17.7 g/dL   Hematocrit 16.1 09.6 - 51.0 %   MCV 86 79 - 97 fL   MCH 28.1 26.6 - 33.0 pg   MCHC 32.8 31.5 - 35.7 g/dL   RDW 04.5 40.9 - 81.1 %   Platelets 227 150 - 450 x10E3/uL   Neutrophils 40 Not Estab. %   Lymphs 35 Not Estab. %   Monocytes 13 Not Estab. %   Eos 11 Not Estab. %   Basos 1 Not Estab. %   Neutrophils Absolute 2.1 1.4 - 7.0 x10E3/uL   Lymphocytes Absolute 1.8 0.7 - 3.1 x10E3/uL   Monocytes Absolute 0.7 0.1 - 0.9 x10E3/uL   EOS (ABSOLUTE) 0.6 (H) 0.0 - 0.4 x10E3/uL   Basophils Absolute 0.1 0.0 - 0.2  x10E3/uL   Immature Granulocytes 0 Not Estab. %   Immature Grans (Abs) 0.0 0.0 - 0.1 x10E3/uL  Lipid panel     Status: Abnormal   Collection Time: 12/31/23  9:17 AM  Result Value Ref Range   Cholesterol, Total 110 100 - 199 mg/dL   Triglycerides 914 0 - 149 mg/dL   HDL 33 (L) >78 mg/dL   VLDL Cholesterol Cal 19 5 - 40 mg/dL   LDL Chol Calc (NIH) 58 0 - 99 mg/dL   Chol/HDL Ratio 3.3 0.0 - 5.0 ratio    Comment:                                   T. Chol/HDL Ratio                                             Men  Women                               1/2 Avg.Risk  3.4    3.3                                   Avg.Risk  5.0    4.4  2X Avg.Risk  9.6    7.1                                3X Avg.Risk 23.4   11.0   Comprehensive metabolic panel     Status: Abnormal   Collection Time: 12/31/23  9:17 AM  Result Value Ref Range   Glucose 102 (H) 70 - 99 mg/dL   BUN 19 8 - 27 mg/dL   Creatinine, Ser 1.61 (H) 0.76 - 1.27 mg/dL   eGFR 47 (L) >09 UE/AVW/0.98   BUN/Creatinine Ratio 12 10 - 24   Sodium 141 134 - 144 mmol/L   Potassium 4.3 3.5 - 5.2 mmol/L   Chloride 102 96 - 106 mmol/L   CO2 21 20 - 29 mmol/L   Calcium 9.8 8.6 - 10.2 mg/dL   Total Protein 7.0 6.0 - 8.5 g/dL   Albumin 4.3 3.8 - 4.8 g/dL   Globulin, Total 2.7 1.5 - 4.5 g/dL   Bilirubin Total 0.6 0.0 - 1.2 mg/dL   Alkaline Phosphatase 72 44 - 121 IU/L   AST 25 0 - 40 IU/L   ALT 24 0 - 44 IU/L  B12 and Folate Panel     Status: Abnormal   Collection Time: 12/31/23  9:17 AM  Result Value Ref Range   Vitamin B-12 >2000 (H) 232 - 1245 pg/mL   Folate 7.0 >3.0 ng/mL    Comment: A serum folate concentration of less than 3.1 ng/mL is considered to represent clinical deficiency.   VITAMIN D 25 Hydroxy (Vit-D Deficiency, Fractures)     Status: None   Collection Time: 12/31/23  9:17 AM  Result Value Ref Range   Vit D, 25-Hydroxy 39.7 30.0 - 100.0 ng/mL    Comment: Vitamin D deficiency has been defined by  the Institute of Medicine and an Endocrine Society practice guideline as a level of serum 25-OH vitamin D less than 20 ng/mL (1,2). The Endocrine Society went on to further define vitamin D insufficiency as a level between 21 and 29 ng/mL (2). 1. IOM (Institute of Medicine). 2010. Dietary reference    intakes for calcium and D. [place]: The    [employer]. 2. Holick MF, [place], Bischoff-Ferrari HA, et al.    Evaluation, treatment, and prevention of vitamin D    deficiency: an Endocrine Society clinical practice    guideline. JCEM. 2011 Jul; 96(7):1911-30.   Uric acid     Status: Abnormal   Collection Time: 12/31/23  9:17 AM  Result Value Ref Range   Uric Acid 9.0 (H) 3.8 - 8.4 mg/dL    Comment:            Therapeutic target for gout patients: <6.0  CRP High sensitivity     Status: None   Collection Time: 12/31/23  9:17 AM  Result Value Ref Range   CRP, High Sensitivity 0.35 0.00 - 3.00 mg/L    Comment:          Relative Risk for Future Cardiovascular Event                              Low                 <1.00  Average       1.00 - 3.00                              High                >3.00   Hemoglobin A1c     Status: Abnormal   Collection Time: 12/31/23  9:17 AM  Result Value Ref Range   Hgb A1c MFr Bld 6.2 (H) 4.8 - 5.6 %    Comment:          Prediabetes: 5.7 - 6.4          Diabetes: >6.4          Glycemic control for adults with diabetes: <7.0    Est. average glucose Bld gHb Est-mCnc 131 mg/dL    Diabetic Foot Exam:     PHQ2/9:    01/03/2024    8:19 AM 10/03/2023    8:34 AM 06/27/2023    9:36 AM 03/28/2023    8:37 AM 02/06/2023   10:18 AM  Depression screen PHQ 2/9  Decreased Interest 0 0 0 0 0  Down, Depressed, Hopeless 0 0 0 0 0  PHQ - 2 Score 0 0 0 0 0  Altered sleeping 0 0 0 0 0  Tired, decreased energy 0 0 0 0 0  Change in appetite 0 0 0 0 0  Feeling bad or failure about yourself  0 0 0 0 0  Trouble  concentrating 0 0 0 0 0  Moving slowly or fidgety/restless 0 0 0 0 0  Suicidal thoughts 0 0 0 0 0  PHQ-9 Score 0 0 0 0 0  Difficult doing work/chores Not difficult at all        phq 9 is negative  Fall Risk:    01/03/2024    8:15 AM 10/03/2023    8:34 AM 06/27/2023    9:35 AM 03/28/2023    8:36 AM 02/06/2023   10:18 AM  Fall Risk   Falls in the past year? 0 0 0 0 0  Number falls in past yr: 0      Injury with Fall? 0      Risk for fall due to : No Fall Risks No Fall Risks No Fall Risks No Fall Risks No Fall Risks  Follow up Falls prevention discussed;Education provided;Falls evaluation completed Falls prevention discussed Falls prevention discussed Falls prevention discussed Falls prevention discussed;Education provided;Falls evaluation completed     Assessment and Plan    Coronary Artery Disease Recent cardiac catheterization revealed significant blockage. Medical management initiated with Aspirin, Nexlizet, and Metoprolol. No recent chest pain or use of nitroglycerin. -Continue Aspirin, Nexlizet, and Metoprolol. -Monitor for any chest pain or need for nitroglycerin use.  Hypertension Currently managed with Benicar HCTZ and Metoprolol. Considering discontinuation of HCTZ due to potential for gout exacerbation. -Discontinue HCTZ. -Initiate Amlodipine 2.5mg  daily for blood pressure control, he had intolerance in the past due to lower extremity edema but should be able to tolerate a lower dse  Gout Elevated uric acid levels and recent gout attacks. History of intolerance to Allopurinol. -Discontinue HCTZ due to potential for gout exacerbation. -Initiate Uloric 40mg  daily for gout prevention. -Continue Colchicine as needed for acute gout attacks.  Chronic Kidney Disease (Stage 3A) Stable GFR between 47-50. -Initiate Farxiga 10mg  daily for kidney protection. -Continue hydration and avoidance of NSAIDs.  Prediabetes Stable HbA1c at 6.2. Reports sugar cravings. -Initiate  Farxiga 10mg  daily to help lower blood sugar levels. -Encourage low carbohydrate diet and physical activity.  Chronic Right Shoulder Pain Longstanding pain, worsening over time. -Referral to [facility] for further evaluation and management.  Hyperlipidemia Managed with Nexlizet. Low HDL. -Continue Nexlizet. -Encourage diet rich in fish and tree nuts, and regular physical activity to increase HDL levels.  Vitamin B12 Supplementation High levels of Vitamin B12. -Reduce Vitamin B12 supplementation to 3 times a week.  Vitamin D Deficiency Managed with Vitamin D supplementation. -Continue Vitamin D supplementation.  Erectile Dysfunction Occasional use of ED medication. -Continue current management as needed.  Follow-up in 3 months to monitor kidney function and uric acid levels.

## 2024-01-10 ENCOUNTER — Ambulatory Visit: Payer: Managed Care, Other (non HMO) | Admitting: Cardiology

## 2024-01-30 ENCOUNTER — Other Ambulatory Visit: Payer: Self-pay

## 2024-01-30 MED ORDER — METOPROLOL SUCCINATE ER 25 MG PO TB24: 25.0000 mg | ORAL_TABLET | Freq: Every day | ORAL | 2 refills | Status: DC

## 2024-01-30 NOTE — Telephone Encounter (Signed)
Last visit: 01/02/24 with follow up plan 6 months  next visit: none/active recall  Requested Prescriptions   Signed Prescriptions Disp Refills   metoprolol succinate (TOPROL XL) 25 MG 24 hr tablet 90 tablet 2    Sig: Take 1 tablet (25 mg total) by mouth daily.    Authorizing Provider: END, CHRISTOPHER    Ordering User: Guerry Minors

## 2024-02-10 ENCOUNTER — Ambulatory Visit (INDEPENDENT_AMBULATORY_CARE_PROVIDER_SITE_OTHER): Payer: Managed Care, Other (non HMO) | Admitting: Family Medicine

## 2024-02-10 ENCOUNTER — Encounter: Payer: Self-pay | Admitting: Family Medicine

## 2024-02-10 VITALS — BP 116/68 | HR 87 | Resp 16 | Ht 66.0 in | Wt 192.9 lb

## 2024-02-10 DIAGNOSIS — M109 Gout, unspecified: Secondary | ICD-10-CM

## 2024-02-10 DIAGNOSIS — Z0000 Encounter for general adult medical examination without abnormal findings: Secondary | ICD-10-CM

## 2024-02-10 DIAGNOSIS — Z1211 Encounter for screening for malignant neoplasm of colon: Secondary | ICD-10-CM | POA: Diagnosis not present

## 2024-02-10 DIAGNOSIS — N1831 Chronic kidney disease, stage 3a: Secondary | ICD-10-CM

## 2024-02-10 DIAGNOSIS — Z0001 Encounter for general adult medical examination with abnormal findings: Secondary | ICD-10-CM | POA: Diagnosis not present

## 2024-02-10 NOTE — Progress Notes (Signed)
 Name: Fernando Hart   MRN: [id number]    DOB: 08-15-1951   Date:02/10/2024       Progress Note  Subjective  Chief Complaint  Chief Complaint  Patient presents with   Annual Exam    HPI  Patient presents for annual CPE .   IPSS     Row Name 02/10/24 [phone]         International Prostate Symptom Score   How often have you had the sensation of not emptying your bladder? Not at All     How often have you had to urinate less than every two hours? Not at All     How often have you found you stopped and started again several times when you urinated? Not at All     How often have you found it difficult to postpone urination? Not at All     How often have you had a weak urinary stream? Not at All     How often have you had to strain to start urination? Not at All     How many times did you typically get up at night to urinate? 1 Time     Total IPSS Score 1       Quality of Life due to urinary symptoms   If you were to spend the rest of your life with your urinary condition just the way it is now how would you feel about that? Pleased              Diet: he is using a food delivery system that has a balanced diet  Exercise: not active Last Dental Exam: up to date Last Eye Exam: up to date  Depression: phq 9 is negative    01/03/2024    8:19 AM 10/03/2023    8:34 AM 06/27/2023    9:36 AM 03/28/2023    8:37 AM 02/06/2023   10:18 AM  Depression screen PHQ 2/9  Decreased Interest 0 0 0 0 0  Down, Depressed, Hopeless 0 0 0 0 0  PHQ - 2 Score 0 0 0 0 0  Altered sleeping 0 0 0 0 0  Tired, decreased energy 0 0 0 0 0  Change in appetite 0 0 0 0 0  Feeling bad or failure about yourself  0 0 0 0 0  Trouble concentrating 0 0 0 0 0  Moving slowly or fidgety/restless 0 0 0 0 0  Suicidal thoughts 0 0 0 0 0  PHQ-9 Score 0 0 0 0 0  Difficult doing work/chores Not difficult at all        Hypertension:  BP Readings from Last 3 Encounters:  02/10/24 116/68  01/03/24 124/68   01/02/24 110/62    Obesity: Wt Readings from Last 3 Encounters:  02/10/24 192 lb 14.4 oz (87.5 kg)  01/03/24 194 lb 12.8 oz (88.4 kg)  01/02/24 198 lb 12.8 oz (90.2 kg)   BMI Readings from Last 3 Encounters:  02/10/24 31.13 kg/m  01/03/24 30.51 kg/m  01/02/24 31.14 kg/m     Flowsheet Row Office Visit from 01/03/2024 in [facility]  AUDIT-C Score 1        Divorced STD testing and prevention (HIV/chl/gon/syphilis):  not applicable Sexual history: same sexual partner for a long time Hep C Screening: completed Skin cancer: Discussed monitoring for atypical lesions Colorectal cancer: FIT test in October Prostate cancer:  not applicable    Lung cancer:  Low Dose CT Chest recommended if  Age 17-80 years, 30 pack-year currently smoking OR have quit w/in 15years. Patient  is not a candidate for screening   AAA: The USPSTF recommends one-time screening with ultrasonography in men ages 90 to 75 years who have ever smoked. Patient   is not a candidate for screening  ECG:  12/2023  Vaccines: reviewed with the patient.   Advanced Care Planning: A voluntary discussion about advance care planning including the explanation and discussion of advance directives.  Discussed health care proxy and Living will, and the patient was able to identify a health care proxy as daughter.  Patient does not have a living will and power of attorney of health care   Patient Active Problem List   Diagnosis Date Noted   Chest pain 12/06/2023   Abnormal cardiac CT angiography 12/06/2023   Stage 3a chronic kidney disease (HCC) 11/04/2020   Vitamin D deficiency 11/04/2020   Intermittent low back pain 03/24/2018   Tinnitus of right ear 07/12/2017   Dyslipidemia 10/11/2016   Obesity 10/11/2016   Degenerative joint disease of right hip 02/28/2016   Prediabetes 01/10/2016   Controlled gout 01/10/2016   Essential hypertension 01/10/2016    Past Surgical History:  Procedure  Laterality Date   LEFT HEART CATH AND CORONARY ANGIOGRAPHY Left 12/06/2023   Procedure: LEFT HEART CATH AND CORONARY ANGIOGRAPHY;  Surgeon: Yvonne Kendall, MD;  Location: [facility] INVASIVE CV LAB;  Service: Cardiovascular;  Laterality: Left;   ROOT CANAL      Family History  Problem Relation Age of Onset   Hypertension Mother        Died in her late 8s   Diabetes Father        Died at 94   Diabetes Sister    GI Bleed Daughter     Social History   Socioeconomic History   Marital status: Divorced    Spouse name: Not on file   Number of children: 3   Years of education: Not on file   Highest education level: Associate degree: occupational, Scientist, product/process development, or vocational program  Occupational History   Occupation: IT   Tobacco Use   Smoking status: Former    Current packs/day: 0.00    Average packs/day: 1 pack/day for 35.0 years (35.0 ttl pk-yrs)    Types: Cigarettes    Start date: 07/1969    Quit date: 07/2004    Years since quitting: 19.5   Smokeless tobacco: Never  Vaping Use   Vaping status: Never Used  Substance and Sexual Activity   Alcohol use: Not Currently    Alcohol/week: 2.0 standard drinks of alcohol    Types: 2 Cans of beer per week    Comment: not every week   Drug use: Yes   Sexual activity: Yes    Partners: Female    Comment: partner is post-menopausal  Other Topics Concern   Not on file  Social History Narrative   Divorced many years ago, dating same person for 25 years she lives in [place] but they thinking about getting married   He had  3 grown children, youngest daughter died suddenly at age 67 from unknown at her gym .    8 grandchildren.    Two daughters living in [place]   Social Drivers of Health   Financial Resource Strain: Low Risk  (12/30/2023)   Overall Financial Resource Strain (CARDIA)    Difficulty of Paying Living Expenses: Not hard at all  Food Insecurity: No Food Insecurity (12/30/2023)   Hunger Vital Sign  Worried About Programme researcher, broadcasting/film/video  in the Last Year: Never true    Ran Out of Food in the Last Year: Never true  Transportation Needs: No Transportation Needs (12/30/2023)   PRAPARE - Administrator, Civil Service (Medical): No    Lack of Transportation (Non-Medical): No  Physical Activity: Insufficiently Active (12/30/2023)   Exercise Vital Sign    Days of Exercise per Week: 5 days    Minutes of Exercise per Session: 20 min  Stress: No Stress Concern Present (12/30/2023)   [employer] of Occupational Health - Occupational Stress Questionnaire    Feeling of Stress : Only a little  Social Connections: Socially Isolated (12/30/2023)   Social Connection and Isolation Panel [NHANES]    Frequency of Communication with Friends and Family: More than three times a week    Frequency of Social Gatherings with Friends and Family: Patient declined    Attends Religious Services: Never    Database administrator or Organizations: No    Attends Engineer, structural: Not on file    Marital Status: Divorced  Intimate Partner Violence: Not At Risk (02/10/2024)   Humiliation, Afraid, Rape, and Kick questionnaire    Fear of Current or Ex-Partner: No    Emotionally Abused: No    Physically Abused: No    Sexually Abused: No     Current Outpatient Medications:    amLODipine (NORVASC) 2.5 MG tablet, Take 1 tablet (2.5 mg total) by mouth daily., Disp: 90 tablet, Rfl: 1   aspirin EC 81 MG tablet, Take 1 tablet (81 mg total) by mouth daily. Swallow whole., Disp: , Rfl:    Bempedoic Acid-Ezetimibe (NEXLIZET) 180-10 MG TABS, Take 1 tablet by mouth daily at 12 noon., Disp: 90 tablet, Rfl: 1   Berberine Chloride 500 MG CAPS, Take 500 mg by mouth 2 (two) times daily., Disp: , Rfl:    Cholecalciferol (VITAMIN D3) 50 MCG (2000 UT) capsule, Take 1 capsule (2,000 Units total) by mouth daily., Disp: 100 capsule, Rfl: 1   colchicine 0.6 MG tablet, Take 1-2 tablets (0.6-1.2 mg total) by mouth daily as needed., Disp: 30 tablet, Rfl:  0   Cyanocobalamin 5000 MCG TBDP, Take 5,000 mcg by mouth daily., Disp: , Rfl:    dapagliflozin propanediol (FARXIGA) 10 MG TABS tablet, Take 1 tablet (10 mg total) by mouth daily., Disp: 90 tablet, Rfl: 1   diclofenac Sodium (VOLTAREN) 1 % GEL, APPLY 2 GRAMS TOPICALLY TO AFFECTED AREA FOUR TIMES DAILY (Patient taking differently: Apply 2 g topically daily as needed (pain).), Disp: 300 g, Rfl: 1   docusate sodium (COLACE) 100 MG capsule, Take 100 mg by mouth daily., Disp: , Rfl:    EPINEPHrine 0.3 mg/0.3 mL IJ SOAJ injection, Inject 0.3 mg into the muscle as needed for anaphylaxis., Disp: 1 each, Rfl: 0   febuxostat (ULORIC) 40 MG tablet, Take 1 tablet (40 mg total) by mouth daily. For gout prevention, Disp: 90 tablet, Rfl: 1   loratadine (ALAVERT) 10 MG dissolvable tablet, Take 10 mg by mouth daily as needed for allergies., Disp: , Rfl:    metoprolol succinate (TOPROL XL) 25 MG 24 hr tablet, Take 1 tablet (25 mg total) by mouth daily., Disp: 90 tablet, Rfl: 2   nitroGLYCERIN (NITROSTAT) 0.4 MG SL tablet, Place 1 tablet (0.4 mg total) under the tongue every 5 (five) minutes as needed for chest pain., Disp: 45 tablet, Rfl: 3   olmesartan (BENICAR) 40 MG tablet, Take 1 tablet (  40 mg total) by mouth daily., Disp: 90 tablet, Rfl: 1   OVER THE COUNTER MEDICATION, Apply 1 application  topically daily as needed (Leg pain). Neuropathy Cream, Disp: , Rfl:    Polyvinyl Alcohol-Povidone PF (REFRESH) 1.4-0.6 % SOLN, Place 1 drop into both eyes daily as needed (Dry eye)., Disp: , Rfl:    tadalafil (CIALIS) 5 MG tablet, Take 1 tablet (5 mg total) by mouth daily., Disp: 90 tablet, Rfl: 1  Allergies  Allergen Reactions   Allopurinol Other (See Comments)    Chest pain, it has happened two separate times when he was given allopurinol    Shrimp (Diagnostic) Hives   Statins     Myalgias    Norvasc [Amlodipine] Swelling     ROS  Constitutional: Negative for fever or weight change.  Respiratory: Negative for  cough and shortness of breath.   Cardiovascular: Negative for chest pain or palpitations.  Gastrointestinal: Negative for abdominal pain, no bowel changes.  Musculoskeletal: Negative for gait problem or joint swelling.  Skin: Negative for rash.  Neurological: Negative for dizziness or headache.  No other specific complaints in a complete review of systems (except as listed in HPI above).    Objective  Vitals:   02/10/24 0851  BP: 116/68  Pulse: 87  Resp: 16  SpO2: 96%  Weight: 192 lb 14.4 oz (87.5 kg)  Height: 5\' 6"  (1.676 m)    Body mass index is 31.13 kg/m.  Physical Exam  Constitutional: Patient appears well-developed and well-nourished. No distress.  HENT: Head: Normocephalic and atraumatic. Ears: B TMs ok, no erythema or effusion; Nose: Nose normal. Mouth/Throat: Oropharynx is clear and moist. No oropharyngeal exudate.  Eyes: Conjunctivae and EOM are normal. Pupils are equal, round, and reactive to light. No scleral icterus.  Neck: Normal range of motion. Neck supple. No JVD present. No thyromegaly present.  Cardiovascular: Normal rate, regular rhythm and normal heart sounds.  No murmur heard. No BLE edema. Pulmonary/Chest: Effort normal and breath sounds normal. No respiratory distress. Abdominal: Soft. Bowel sounds are normal, no distension. There is no tenderness. no masses MALE GENITALIA: Normal descended testes bilaterally, no masses palpated, no hernias, no lesions, no discharge RECTAL: not done  Musculoskeletal: he has an antalgic gait, decrease rom of spine and right hip. Also pain with rom of right shoulder, seeing Ortho, discussed PT Neurological: he is alert and oriented to person, place, and time. No cranial nerve deficit. Coordination, balance, strength, speech and gait are normal.  Skin: Skin is warm and dry. No rash noted. No erythema.  Psychiatric: Patient has a normal mood and affect. behavior is normal. Judgment and thought content normal.      Assessment & Plan  1. Well adult exam (Primary)  - Uric acid - BMP8+EGFR  2. Stage 3a chronic kidney disease (HCC)  - BMP8+EGFR  3. Controlled gout  - Uric acid   4. Colon cancer screening  - Fecal occult blood, imunochemical([facility])    -Prostate cancer screening and PSA options (with potential risks and benefits of testing vs not testing) were discussed along with recent recs/guidelines. -USPSTF grade A and B recommendations reviewed with patient; age-appropriate recommendations, preventive care, screening tests, etc discussed and encouraged; healthy living encouraged; see AVS for patient education given to patient -Discussed importance of 150 minutes of physical activity weekly, eat two servings of fish weekly, eat one serving of tree nuts ( cashews, pistachios, pecans, almonds.Marland Kitchen) every other day, eat 6 servings of fruit/vegetables daily and drink plenty of  water and avoid sweet beverages.  -Reviewed Health Maintenance: yes

## 2024-02-20 ENCOUNTER — Ambulatory Visit: Payer: Managed Care, Other (non HMO) | Admitting: Dermatology

## 2024-03-04 ENCOUNTER — Telehealth: Payer: Self-pay

## 2024-03-04 NOTE — Telephone Encounter (Signed)
 Cover My Meds prior auth on  Bempedoic Acid-Ezetimibe (NEXLIZET) 180-10 MG TABS   Key [id number]

## 2024-03-04 NOTE — Telephone Encounter (Signed)
 PA done waiting on insurance to determine

## 2024-03-18 ENCOUNTER — Other Ambulatory Visit: Payer: Self-pay | Admitting: Family Medicine

## 2024-03-18 DIAGNOSIS — M109 Gout, unspecified: Secondary | ICD-10-CM

## 2024-05-01 ENCOUNTER — Ambulatory Visit: Payer: Self-pay | Admitting: Family Medicine

## 2024-05-04 ENCOUNTER — Encounter: Payer: Self-pay | Admitting: Podiatry

## 2024-05-04 ENCOUNTER — Ambulatory Visit: Admitting: Podiatry

## 2024-05-04 DIAGNOSIS — M79675 Pain in left toe(s): Secondary | ICD-10-CM

## 2024-05-04 DIAGNOSIS — B351 Tinea unguium: Secondary | ICD-10-CM

## 2024-05-04 DIAGNOSIS — M79674 Pain in right toe(s): Secondary | ICD-10-CM

## 2024-05-04 NOTE — Progress Notes (Signed)
 He presents today chief complaint of painful elongated toenails.  He is unable to cut them himself.  He does relate some claudication type symptomatology to his left thigh..  He states that sometimes after he has been walking for a while when it starts to bother him and sometimes it is when he stands up immediately.  He states that his legs are generally weak.  He does state that he has an appointment with his primary care provider and he is going to inform her of this on June 2.  Objective: Vital signs are stable he is alert and oriented x 3.  Pulses are palpable feet are warm to the touch capillary fill time is immediate.  The pulses are reduced and there are amplitude from previous evaluations.  Toenails are long dystrophic incurvated and painful.  Assessment: Cannot rule out vascular insufficiency such as peripheral vascular disease or peripheral arterial disease also pseudoclaudication is a possibility with some spinal compression.  Otherwise toenails are long thick dystrophic sharply incurvated.  Plan: Debrided nails for him today.  He will speak to his PCP about getting a test ABIs.  If the fails to render reasons for his calf and thigh weakness and pain then I would consider evaluating his back.

## 2024-05-07 ENCOUNTER — Other Ambulatory Visit: Payer: Self-pay | Admitting: Family Medicine

## 2024-05-07 DIAGNOSIS — E785 Hyperlipidemia, unspecified: Secondary | ICD-10-CM

## 2024-05-18 ENCOUNTER — Ambulatory Visit: Admitting: Family Medicine

## 2024-05-18 ENCOUNTER — Encounter: Payer: Self-pay | Admitting: Family Medicine

## 2024-05-18 VITALS — BP 132/70 | HR 87 | Resp 16 | Ht 66.0 in | Wt 193.4 lb

## 2024-05-18 DIAGNOSIS — I739 Peripheral vascular disease, unspecified: Secondary | ICD-10-CM | POA: Diagnosis not present

## 2024-05-18 DIAGNOSIS — N1831 Chronic kidney disease, stage 3a: Secondary | ICD-10-CM

## 2024-05-18 DIAGNOSIS — N528 Other male erectile dysfunction: Secondary | ICD-10-CM

## 2024-05-18 DIAGNOSIS — T466X5A Adverse effect of antihyperlipidemic and antiarteriosclerotic drugs, initial encounter: Secondary | ICD-10-CM

## 2024-05-18 DIAGNOSIS — G720 Drug-induced myopathy: Secondary | ICD-10-CM | POA: Insufficient documentation

## 2024-05-18 DIAGNOSIS — E559 Vitamin D deficiency, unspecified: Secondary | ICD-10-CM

## 2024-05-18 DIAGNOSIS — I10 Essential (primary) hypertension: Secondary | ICD-10-CM | POA: Diagnosis not present

## 2024-05-18 DIAGNOSIS — E785 Hyperlipidemia, unspecified: Secondary | ICD-10-CM

## 2024-05-18 DIAGNOSIS — M109 Gout, unspecified: Secondary | ICD-10-CM | POA: Diagnosis not present

## 2024-05-18 DIAGNOSIS — E790 Hyperuricemia without signs of inflammatory arthritis and tophaceous disease: Secondary | ICD-10-CM | POA: Insufficient documentation

## 2024-05-18 DIAGNOSIS — I25118 Atherosclerotic heart disease of native coronary artery with other forms of angina pectoris: Secondary | ICD-10-CM | POA: Insufficient documentation

## 2024-05-18 DIAGNOSIS — R7989 Other specified abnormal findings of blood chemistry: Secondary | ICD-10-CM

## 2024-05-18 DIAGNOSIS — I2510 Atherosclerotic heart disease of native coronary artery without angina pectoris: Secondary | ICD-10-CM

## 2024-05-18 DIAGNOSIS — M170 Bilateral primary osteoarthritis of knee: Secondary | ICD-10-CM

## 2024-05-18 MED ORDER — AMLODIPINE BESYLATE 2.5 MG PO TABS: 2.5000 mg | ORAL_TABLET | Freq: Every day | ORAL | 1 refills | Status: DC

## 2024-05-18 MED ORDER — FEBUXOSTAT 40 MG PO TABS: 40.0000 mg | ORAL_TABLET | Freq: Every day | ORAL | 1 refills | Status: DC

## 2024-05-18 MED ORDER — TADALAFIL 5 MG PO TABS: 5.0000 mg | ORAL_TABLET | Freq: Every day | ORAL | 1 refills | Status: AC

## 2024-05-18 MED ORDER — DAPAGLIFLOZIN PROPANEDIOL 10 MG PO TABS: 10.0000 mg | ORAL_TABLET | Freq: Every day | ORAL | 1 refills | Status: DC

## 2024-05-18 MED ORDER — OLMESARTAN MEDOXOMIL 40 MG PO TABS: 40.0000 mg | ORAL_TABLET | Freq: Every day | ORAL | 1 refills | Status: DC

## 2024-05-18 NOTE — Progress Notes (Signed)
 Name: Fernando Hart   MRN: [id number]    DOB: 1951/09/10   Date:05/18/2024       Progress Note  Subjective  Chief Complaint  Chief Complaint  Patient presents with   Medical Management of Chronic Issues   Leg Pain    Pt states pain is worst along with balance   HPI   HTN: aking Benicar  40/25 mg daily and Metoprolol  given by  cardiologist. Last visit we added norvasc  2.5 mg and stopped hydrochlorothiazide due to gout . He is doing well on current regiment    CAD: he had chest pain shortly after starting allopurinol  in October, he was since than referred to cardiologist and found to have CAD , he is on medical management. Denies any recent episodes of chest pain , never took NTG   CKI stage III: he is aware that he needs to avoid NSAID's, he takes ARB. GFR stable. Good urine output    Hyperlipidemia: he used to take Crestor  occasionally but once he started to take it daily a couple of months ago he developed severe muscle pain and stopped crestor ,  we tried Pravastatin  and caused same symptoms , he has CAD, tolerating Nexlizet  and last LDL was down to 58 . Continue current regiment    Hyperglycemia: He denies polyphagia, polydipsia or polyuria.He stopped Metformin  because it made him feel fatigued. A1C has been stable at 6.2 % . We will continue to monitor    ED: she has difficulty starting an erection, able to maintain , taking low dose cialis  most days and seems to be working well    Gout: he has been taking Uloric  now and we will recheck uric acid, no recent episodes of gout    Chronic right shoulder pain: going on for over one year, getting progressive worse and causing decrease rom, specially abduction and asked to see Ortho  OA both knees: continue topical medication and tylenol , uses exercise bike at home  Claudication, he has pain on both legs when walking, mostly quads. He was seen by podiatrist and recommend ABI test, he also has a low back pain and radiculitis, explained I  negative vascular studies evaluation we will check for neurogenic claudication  Patient Active Problem List   Diagnosis Date Noted   CAD in native artery 05/18/2024   Statin myopathy 05/18/2024   Low vitamin B12 level 05/18/2024   Elevated uric acid in blood 05/18/2024   Primary osteoarthritis of both knees 05/18/2024   Claudication (HCC) 05/18/2024   Chest pain 12/06/2023   Abnormal cardiac CT angiography 12/06/2023   Stage 3a chronic kidney disease (HCC) 11/04/2020   Vitamin D  deficiency 11/04/2020   Intermittent low back pain 03/24/2018   Tinnitus of right ear 07/12/2017   Dyslipidemia 10/11/2016   Obesity 10/11/2016   Degenerative joint disease of right hip 02/28/2016   Prediabetes 01/10/2016   Controlled gout 01/10/2016   Essential hypertension 01/10/2016    Past Surgical History:  Procedure Laterality Date   LEFT HEART CATH AND CORONARY ANGIOGRAPHY Left 12/06/2023   Procedure: LEFT HEART CATH AND CORONARY ANGIOGRAPHY;  Surgeon: Sammy Crisp, MD;  Location: [facility] INVASIVE CV LAB;  Service: Cardiovascular;  Laterality: Left;   ROOT CANAL      Family History  Problem Relation Age of Onset   Hypertension Mother        Died in her late 43s   Diabetes Father        Died at 67   Diabetes Sister  GI Bleed Daughter     Social History   Tobacco Use   Smoking status: Former    Current packs/day: 0.00    Average packs/day: 1 pack/day for 35.0 years (35.0 ttl pk-yrs)    Types: Cigarettes    Start date: 07/1969    Quit date: 07/2004    Years since quitting: 19.8   Smokeless tobacco: Never  Substance Use Topics   Alcohol use: Not Currently    Alcohol/week: 2.0 standard drinks of alcohol    Types: 2 Cans of beer per week    Comment: not every week     Current Outpatient Medications:    aspirin  EC 81 MG tablet, Take 1 tablet (81 mg total) by mouth daily. Swallow whole., Disp: , Rfl:    Bempedoic Acid-Ezetimibe  (NEXLIZET ) 180-10 MG TABS, Take 1 tablet by mouth  daily at 12 noon., Disp: 90 tablet, Rfl: 1   Berberine Chloride 500 MG CAPS, Take 500 mg by mouth 2 (two) times daily., Disp: , Rfl:    Cholecalciferol (VITAMIN D3) 50 MCG (2000 UT) capsule, Take 1 capsule (2,000 Units total) by mouth daily., Disp: 100 capsule, Rfl: 1   colchicine  0.6 MG tablet, TAKE 1 TO 2 TABLETS BY MOUTH  DAILY AS NEEDED, Disp: 30 tablet, Rfl: 0   Cyanocobalamin 5000 MCG TBDP, Take 5,000 mcg by mouth daily., Disp: , Rfl:    diclofenac  Sodium (VOLTAREN ) 1 % GEL, APPLY 2 GRAMS TOPICALLY TO AFFECTED AREA FOUR TIMES DAILY (Patient taking differently: Apply 2 g topically daily as needed (pain).), Disp: 300 g, Rfl: 1   docusate sodium (COLACE) 100 MG capsule, Take 100 mg by mouth daily., Disp: , Rfl:    EPINEPHrine  0.3 mg/0.3 mL IJ SOAJ injection, Inject 0.3 mg into the muscle as needed for anaphylaxis., Disp: 1 each, Rfl: 0   loratadine (ALAVERT) 10 MG dissolvable tablet, Take 10 mg by mouth daily as needed for allergies., Disp: , Rfl:    metoprolol  succinate (TOPROL  XL) 25 MG 24 hr tablet, Take 1 tablet (25 mg total) by mouth daily., Disp: 90 tablet, Rfl: 2   OVER THE COUNTER MEDICATION, Apply 1 application  topically daily as needed (Leg pain). Neuropathy Cream, Disp: , Rfl:    Polyvinyl Alcohol-Povidone PF (REFRESH) 1.4-0.6 % SOLN, Place 1 drop into both eyes daily as needed (Dry eye)., Disp: , Rfl:    amLODipine  (NORVASC ) 2.5 MG tablet, Take 1 tablet (2.5 mg total) by mouth daily., Disp: 90 tablet, Rfl: 1   dapagliflozin  propanediol (FARXIGA ) 10 MG TABS tablet, Take 1 tablet (10 mg total) by mouth daily., Disp: 90 tablet, Rfl: 1   febuxostat  (ULORIC ) 40 MG tablet, Take 1 tablet (40 mg total) by mouth daily. For gout prevention, Disp: 90 tablet, Rfl: 1   nitroGLYCERIN  (NITROSTAT ) 0.4 MG SL tablet, Place 1 tablet (0.4 mg total) under the tongue every 5 (five) minutes as needed for chest pain., Disp: 45 tablet, Rfl: 3   olmesartan  (BENICAR ) 40 MG tablet, Take 1 tablet (40 mg total) by  mouth daily., Disp: 90 tablet, Rfl: 1   tadalafil  (CIALIS ) 5 MG tablet, Take 1 tablet (5 mg total) by mouth daily., Disp: 90 tablet, Rfl: 1  Allergies  Allergen Reactions   Allopurinol  Other (See Comments)    Chest pain, it has happened two separate times when he was given allopurinol     Shrimp (Diagnostic) Hives   Statins     Myalgias    Norvasc  [Amlodipine ] Swelling    I personally  reviewed active problem list, medication list, allergies with the patient/caregiver today.   ROS  Ten systems reviewed and is negative except as mentioned in HPI    Objective Physical Exam CONSTITUTIONAL: Patient appears well-developed and well-nourished.  No distress. HEENT: Head atraumatic, normocephalic, neck supple. CARDIOVASCULAR: Normal rate, regular rhythm and normal heart sounds.  No murmur heard. No BLE edema. PULMONARY: Effort normal and breath sounds normal. No respiratory distress. MUSCULOSKELETAL: antalgic gait, gets up slowly from chair PSYCHIATRIC: Patient has a normal mood and affect. behavior is normal. Judgment and thought content normal.  Vitals:   05/18/24 0900  BP: 132/70  Pulse: 87  Resp: 16  SpO2: 96%  Weight: 193 lb 6.4 oz (87.7 kg)  Height: 5\' 6"  (1.676 m)    Body mass index is 31.22 kg/m.    PHQ2/9:    05/18/2024    8:57 AM 01/03/2024    8:19 AM 10/03/2023    8:34 AM 06/27/2023    9:36 AM 03/28/2023    8:37 AM  Depression screen PHQ 2/9  Decreased Interest 0 0 0 0 0  Down, Depressed, Hopeless 0 0 0 0 0  PHQ - 2 Score 0 0 0 0 0  Altered sleeping  0 0 0 0  Tired, decreased energy  0 0 0 0  Change in appetite  0 0 0 0  Feeling bad or failure about yourself   0 0 0 0  Trouble concentrating  0 0 0 0  Moving slowly or fidgety/restless  0 0 0 0  Suicidal thoughts  0 0 0 0  PHQ-9 Score  0 0 0 0  Difficult doing work/chores  Not difficult at all       phq 9 is negative  Fall Risk:    05/18/2024    8:57 AM 01/03/2024    8:15 AM 10/03/2023    8:34 AM  06/27/2023    9:35 AM 03/28/2023    8:36 AM  Fall Risk   Falls in the past year? 0 0 0 0 0  Number falls in past yr: 0 0     Injury with Fall? 0 0     Risk for fall due to : No Fall Risks No Fall Risks No Fall Risks No Fall Risks No Fall Risks  Follow up Falls prevention discussed;Education provided;Falls evaluation completed Falls prevention discussed;Education provided;Falls evaluation completed Falls prevention discussed Falls prevention discussed Falls prevention discussed     Assessment & Plan  1. Claudication [facility]) (Primary)  - Ambulatory referral to Vascular Surgery  2. Stage 3a chronic kidney disease (HCC)  - amLODipine  (NORVASC ) 2.5 MG tablet; Take 1 tablet (2.5 mg total) by mouth daily.  Dispense: 90 tablet; Refill: 1 - dapagliflozin  propanediol (FARXIGA ) 10 MG TABS tablet; Take 1 tablet (10 mg total) by mouth daily.  Dispense: 90 tablet; Refill: 1 - olmesartan  (BENICAR ) 40 MG tablet; Take 1 tablet (40 mg total) by mouth daily.  Dispense: 90 tablet; Refill: 1  3. Essential hypertension  - amLODipine  (NORVASC ) 2.5 MG tablet; Take 1 tablet (2.5 mg total) by mouth daily.  Dispense: 90 tablet; Refill: 1 - olmesartan  (BENICAR ) 40 MG tablet; Take 1 tablet (40 mg total) by mouth daily.  Dispense: 90 tablet; Refill: 1  4. Controlled gout  - febuxostat  (ULORIC ) 40 MG tablet; Take 1 tablet (40 mg total) by mouth daily. For gout prevention  Dispense: 90 tablet; Refill: 1  5. CAD in native artery  Taking   6. Statin myopathy  Continue Zetia    7. Low vitamin B12 level  Take supplements  8. Vitamin D  deficiency  Continue supplementation   9. Primary osteoarthritis of both knees  He has antalgic gait   10. Dyslipidemia   11. Other male erectile dysfunction  - tadalafil  (CIALIS ) 5 MG tablet; Take 1 tablet (5 mg total) by mouth daily.  Dispense: 90 tablet; Refill: 1

## 2024-05-21 ENCOUNTER — Ambulatory Visit: Payer: Self-pay | Admitting: Family Medicine

## 2024-05-21 LAB — BMP8+EGFR
BUN/Creatinine Ratio: 16 (ref 10–24)
BUN: 21 mg/dL (ref 8–27)
CO2: 20 mmol/L (ref 20–29)
Calcium: 9.7 mg/dL (ref 8.6–10.2)
Chloride: 103 mmol/L (ref 96–106)
Creatinine, Ser: 1.31 mg/dL — ABNORMAL HIGH (ref 0.76–1.27)
Glucose: 91 mg/dL (ref 70–99)
Potassium: 4.1 mmol/L (ref 3.5–5.2)
Sodium: 140 mmol/L (ref 134–144)
eGFR: 57 mL/min/{1.73_m2} — ABNORMAL LOW (ref >59)

## 2024-05-21 LAB — URIC ACID: Uric Acid: 2.3 mg/dL — ABNORMAL LOW (ref 3.8–8.4)

## 2024-05-31 ENCOUNTER — Other Ambulatory Visit: Payer: Self-pay | Admitting: Family Medicine

## 2024-05-31 DIAGNOSIS — M109 Gout, unspecified: Secondary | ICD-10-CM

## 2024-06-16 ENCOUNTER — Other Ambulatory Visit (INDEPENDENT_AMBULATORY_CARE_PROVIDER_SITE_OTHER): Payer: Self-pay | Admitting: Nurse Practitioner

## 2024-06-16 DIAGNOSIS — I739 Peripheral vascular disease, unspecified: Secondary | ICD-10-CM

## 2024-06-22 ENCOUNTER — Encounter (INDEPENDENT_AMBULATORY_CARE_PROVIDER_SITE_OTHER): Admitting: Nurse Practitioner

## 2024-06-22 ENCOUNTER — Encounter (INDEPENDENT_AMBULATORY_CARE_PROVIDER_SITE_OTHER)

## 2024-07-08 ENCOUNTER — Ambulatory Visit: Attending: Internal Medicine | Admitting: Internal Medicine

## 2024-07-08 ENCOUNTER — Encounter: Payer: Self-pay | Admitting: Internal Medicine

## 2024-07-08 VITALS — BP 120/60 | HR 74 | Ht 68.0 in | Wt 197.4 lb

## 2024-07-08 DIAGNOSIS — E785 Hyperlipidemia, unspecified: Secondary | ICD-10-CM | POA: Diagnosis not present

## 2024-07-08 DIAGNOSIS — T466X5A Adverse effect of antihyperlipidemic and antiarteriosclerotic drugs, initial encounter: Secondary | ICD-10-CM

## 2024-07-08 DIAGNOSIS — I25118 Atherosclerotic heart disease of native coronary artery with other forms of angina pectoris: Secondary | ICD-10-CM

## 2024-07-08 DIAGNOSIS — G720 Drug-induced myopathy: Secondary | ICD-10-CM

## 2024-07-08 DIAGNOSIS — I10 Essential (primary) hypertension: Secondary | ICD-10-CM

## 2024-07-08 DIAGNOSIS — T466X5D Adverse effect of antihyperlipidemic and antiarteriosclerotic drugs, subsequent encounter: Secondary | ICD-10-CM

## 2024-07-08 DIAGNOSIS — N1831 Chronic kidney disease, stage 3a: Secondary | ICD-10-CM

## 2024-07-08 NOTE — Patient Instructions (Signed)

## 2024-07-08 NOTE — Progress Notes (Signed)
 Cardiology Office Note:  .   Date:  07/08/2024  ID:  Lamar Pouch, DOB [birth year], MRN [id number] PCP: Glenard Mire, MD  [facility] Providers Cardiologist:  Lonni Hanson, MD     History of Present Illness: .   Fernando Hart is a 73 y.o. male with history of nonobstructive coronary artery disease, hypertension, hyperlipidemia, chronic kidney disease stage III, erectile dysfunction, gout, and osteoarthritis, who presents for follow-up of chest pain and nonobstructive CAD.  He was last seen in our office in 12/2023 by Medford Meager, NP, which time he was feeling well without chest pain or dyspnea following addition of metoprolol .  Receiving coronary CTA in 10/2023 was concerning for multivessel CAD with subsequent cath confirming moderate, nonobstructive disease for which medical therapy was recommended.  Today, Mr. Carlyon reports he has been feeling fairly well.  He has occasional mild exertional dyspnea, especially when walking up hills, though this is stable.  He does not have any dyspnea at rest nor chest pain, palpitations, or lightheadedness.  He has intermittent lower extremity edema in the ankles, for which he routinely uses compression stockings.  He has started walking more and is hopeful this will help his leg swelling like in the past.  He notes that his home blood pressures are typically a bit higher, usually around 130/70, though he has not checked them recently.  He is tolerating his current medications well.  ROS: See HPI  Studies Reviewed: SABRA   EKG Interpretation Date/Time:  Wednesday July 08 2024 14:32:23 EDT Ventricular Rate:  74 PR Interval:  154 QRS Duration:  94 QT Interval:  366 QTC Calculation: 406 R Axis:   54  Text Interpretation: Normal sinus rhythm Normal ECG When compared with ECG of 02-Jan-2024 10:18, No significant change was found Confirmed by Jermiyah Ricotta, Lonni [phone]) on 07/08/2024 2:36:22 PM    LHC (12/06/2023): Mild-moderate, nonobstructive  CAD with 30-40% proximal to mid LAD stenosis and focal 60% mid LAD lesion following D2.  Normal LCx.  Dominant RCA with mild diffuse disease and 50% distal stenosis with heavy calcification.  LVEF 55-65% with normal LVEDP.  Risk Assessment/Calculations:             Physical Exam:   VS:  BP 120/60 (BP Location: Left Arm, Patient Position: Sitting, Cuff Size: Large)   Pulse 74   Ht 5' 8 (1.727 m)   Wt 197 lb 6 oz (89.5 kg)   SpO2 96%   BMI 30.01 kg/m    Wt Readings from Last 3 Encounters:  07/08/24 197 lb 6 oz (89.5 kg)  05/18/24 193 lb 6.4 oz (87.7 kg)  02/10/24 192 lb 14.4 oz (87.5 kg)    General:  NAD. Neck: No JVD or HJR. Lungs: Clear to auscultation bilaterally without wheezes or crackles. Heart: Regular rate and rhythm without murmurs, rubs, or gallops. Abdomen: Soft, nontender, nondistended. Extremities: Trace ankle edema bilaterally with compression stockings in place.  ASSESSMENT AND PLAN: .    Coronary artery disease with stable angina and hyperlipidemia: Mr. Braithwaite does not endorse any further angina.  Mild exertional dyspnea is stable.  Cardiac catheterization in December was reassuring with mild-moderate, nonobstructive CAD.  Will continue current medications for secondary prevention including aspirin  and Nexlizet  and lieu of atorvastatin given history of statin myopathy.  Lipids at goal on the last check in January.  Continue amlodipine  and metoprolol  for antianginal therapy.  Hypertension: Blood pressure well-controlled today.  Continue current regimen of amlodipine , metoprolol  succinate, and olmesartan .  Chronic kidney  disease stage 3a: Continue current medications including dapagliflozin  and olmesartan .  Avoid nephrotoxic agents.     Dispo: Return to clinic in 1 year.  Signed, Lonni Hanson, MD

## 2024-07-22 ENCOUNTER — Encounter (INDEPENDENT_AMBULATORY_CARE_PROVIDER_SITE_OTHER): Payer: Self-pay | Admitting: Vascular Surgery

## 2024-07-22 ENCOUNTER — Ambulatory Visit (INDEPENDENT_AMBULATORY_CARE_PROVIDER_SITE_OTHER): Admitting: Vascular Surgery

## 2024-07-22 ENCOUNTER — Ambulatory Visit (INDEPENDENT_AMBULATORY_CARE_PROVIDER_SITE_OTHER)

## 2024-07-22 VITALS — BP 135/73 | HR 70 | Resp 18 | Wt 198.0 lb

## 2024-07-22 DIAGNOSIS — R29818 Other symptoms and signs involving the nervous system: Secondary | ICD-10-CM | POA: Diagnosis not present

## 2024-07-22 DIAGNOSIS — E785 Hyperlipidemia, unspecified: Secondary | ICD-10-CM | POA: Diagnosis not present

## 2024-07-22 DIAGNOSIS — I10 Essential (primary) hypertension: Secondary | ICD-10-CM | POA: Diagnosis not present

## 2024-07-22 DIAGNOSIS — I739 Peripheral vascular disease, unspecified: Secondary | ICD-10-CM | POA: Diagnosis not present

## 2024-07-24 LAB — VAS US ABI WITH/WO TBI
Left ABI: 0.95
Right ABI: 1

## 2024-07-29 ENCOUNTER — Encounter (INDEPENDENT_AMBULATORY_CARE_PROVIDER_SITE_OTHER): Payer: Self-pay | Admitting: Vascular Surgery

## 2024-07-29 NOTE — Progress Notes (Signed)
 Subjective:    Patient ID: Fernando Hart, male    DOB: 07-15-1951, 73 y.o.   MRN: [id number] Chief Complaint  Patient presents with   New Patient (Initial Visit)    Ref [facility] consult claudication with both legs    Fernando Hart presents to clinic today with chief complaint of bilateral lower extremity pain with exercise and at rest.  Patient states this is been going on for over a year now and has progressively gotten worse.  He describes it as pins-and-needles to his feet.  He states that he gets worse sometimes at night as he goes to bed.  He also endorses that it is much easier to walk long distances when he is leaning over anything to assist his walking such as a shopping cart at [employer].  He currently endorses he can only walk about 200 feet before he has to stop and sit down due to the throbbing and the aching in both legs.  He predominately first feels it in his thighs that radiates up through his hip and then into his back.    Review of Systems  Constitutional: Negative.   Musculoskeletal:  Positive for gait problem and myalgias.  Neurological:  Positive for weakness.       Positive weakness to bilateral lower extremities after walking 200 feet  All other systems reviewed and are negative.      Objective:   Physical Exam Constitutional:      Appearance: Normal appearance. He is normal weight.  HENT:     Head: Normocephalic.  Eyes:     Pupils: Pupils are equal, round, and reactive to light.  Cardiovascular:     Rate and Rhythm: Normal rate and regular rhythm.     Pulses: Normal pulses.     Heart sounds: Normal heart sounds.  Pulmonary:     Effort: Pulmonary effort is normal.     Breath sounds: Normal breath sounds.  Abdominal:     General: Abdomen is flat. Bowel sounds are normal.     Palpations: Abdomen is soft.  Musculoskeletal:        General: Tenderness present.     Comments: Pain to his thighs and his hips when walking 200 feet or more  Skin:    General:  Skin is warm and dry.     Capillary Refill: Capillary refill takes 2 to 3 seconds.  Neurological:     General: No focal deficit present.     Mental Status: He is alert and oriented to person, place, and time. Mental status is at baseline.     Gait: Gait abnormal.  Psychiatric:        Mood and Affect: Mood normal.        Behavior: Behavior normal.        Thought Content: Thought content normal.        Judgment: Judgment normal.     BP 135/73   Pulse 70   Resp 18   Wt 198 lb (89.8 kg)   BMI 30.11 kg/m   Past Medical History:  Diagnosis Date   B12 deficiency    CAD (coronary artery disease)    a. 10/2023 Cor CTA: Ca2+ = 2290.  LM nl, LAD >70 (FFRct 0.87), LCX non-dom, 25p (FFR ct 0.98), RCA >70d (FFRct 0.78); b. 11/2023 Cath: LM nl, LAD 35p/m, 26m, LCX nl, RCA large, mild diff dzs, 50d w/ sev Ca2+.  EF 55-65%-->Med rx.   CKD (chronic kidney disease), stage III (HCC)  ED (erectile dysfunction)    Gout    Hypertension    Mixed hyperlipidemia    OA (osteoarthritis)    Vitamin D  deficiency     Social History   Socioeconomic History   Marital status: Divorced    Spouse name: Not on file   Number of children: 3   Years of education: Not on file   Highest education level: Associate degree: occupational, Scientist, product/process development, or vocational program  Occupational History   Occupation: IT   Tobacco Use   Smoking status: Former    Current packs/day: 0.00    Average packs/day: 1 pack/day for 35.0 years (35.0 ttl pk-yrs)    Types: Cigarettes    Start date: 07/1969    Quit date: 07/2004    Years since quitting: 20.0   Smokeless tobacco: Never  Vaping Use   Vaping status: Never Used  Substance and Sexual Activity   Alcohol use: Not Currently    Alcohol/week: 2.0 standard drinks of alcohol    Types: 2 Cans of beer per week    Comment: not every week   Drug use: Yes   Sexual activity: Yes    Partners: Female    Comment: partner is post-menopausal  Other Topics Concern   Not on  file  Social History Narrative   Divorced many years ago, dating same person for 25 years she lives in [place] but they thinking about getting married   He had  3 grown children, youngest daughter died suddenly at age 57 from unknown at her gym .    8 grandchildren.    Two daughters living in [place]   Social Drivers of Health   Financial Resource Strain: Low Risk  (12/30/2023)   Overall Financial Resource Strain (CARDIA)    Difficulty of Paying Living Expenses: Not hard at all  Food Insecurity: No Food Insecurity (12/30/2023)   Hunger Vital Sign    Worried About Running Out of Food in the Last Year: Never true    Ran Out of Food in the Last Year: Never true  Transportation Needs: No Transportation Needs (12/30/2023)   PRAPARE - Administrator, Civil Service (Medical): No    Lack of Transportation (Non-Medical): No  Physical Activity: Insufficiently Active (12/30/2023)   Exercise Vital Sign    Days of Exercise per Week: 5 days    Minutes of Exercise per Session: 20 min  Stress: No Stress Concern Present (12/30/2023)   [employer] of Occupational Health - Occupational Stress Questionnaire    Feeling of Stress : Only a little  Social Connections: Socially Isolated (12/30/2023)   Social Connection and Isolation Panel    Frequency of Communication with Friends and Family: More than three times a week    Frequency of Social Gatherings with Friends and Family: Patient declined    Attends Religious Services: Never    Database administrator or Organizations: No    Attends Engineer, structural: Not on file    Marital Status: Divorced  Intimate Partner Violence: Not At Risk (02/10/2024)   Humiliation, Afraid, Rape, and Kick questionnaire    Fear of Current or Ex-Partner: No    Emotionally Abused: No    Physically Abused: No    Sexually Abused: No    Past Surgical History:  Procedure Laterality Date   LEFT HEART CATH AND CORONARY ANGIOGRAPHY Left 12/06/2023   Procedure:  LEFT HEART CATH AND CORONARY ANGIOGRAPHY;  Surgeon: Mady Bruckner, MD;  Location: [facility] INVASIVE CV LAB;  Service: Cardiovascular;  Laterality: Left;   ROOT CANAL      Family History  Problem Relation Age of Onset   Hypertension Mother        Died in her late 75s   Diabetes Father        Died at 8   Diabetes Sister    GI Bleed Daughter     Allergies  Allergen Reactions   Allopurinol  Other (See Comments)    Chest pain, it has happened two separate times when he was given allopurinol     Shrimp (Diagnostic) Hives   Statins     Myalgias    Norvasc  [Amlodipine ] Swelling       Latest Ref Rng & Units 12/31/2023    9:17 AM 12/28/2022   11:59 AM 01/01/2022    3:13 PM  CBC  WBC 3.4 - 10.8 x10E3/uL 5.1  4.9  5.0   Hemoglobin 13.0 - 17.7 g/dL 84.9  84.7  83.9   Hematocrit 37.5 - 51.0 % 45.8  44.8  46.8   Platelets 150 - 450 x10E3/uL 227  192  208       CMP     Component Value Date/Time   NA 140 05/20/2024 1436   K 4.1 05/20/2024 1436   CL 103 05/20/2024 1436   CO2 20 05/20/2024 1436   GLUCOSE 91 05/20/2024 1436   BUN 21 05/20/2024 1436   CREATININE 1.31 (H) 05/20/2024 1436   CALCIUM  9.7 05/20/2024 1436   PROT 7.0 12/31/2023 0917   ALBUMIN 4.3 12/31/2023 0917   AST 25 12/31/2023 0917   ALT 24 12/31/2023 0917   ALKPHOS 72 12/31/2023 0917   BILITOT 0.6 12/31/2023 0917   EGFR 57 (L) 05/20/2024 1436   GFRNONAA 54 (L) 11/29/2020 0851     VAS US  ABI WITH/WO TBI Result Date: 07/24/2024  LOWER EXTREMITY DOPPLER STUDY Patient Name:  Donny Heffern  Date of Exam:   07/22/2024 Medical Rec #: [id number]        Accession #:    [id number] Date of Birth: 09-25-1951         Patient Gender: M Patient Age:   59 years Exam Location:  [facility] Procedure:      VAS US  ABI WITH/WO TBI Referring Phys: --------------------------------------------------------------------------------  Indications: Claudication, and peripheral artery disease. High Risk Factors: Hypertension, past  history of smoking.  Performing Technologist: Donnice Charnley RVT  Examination Guidelines: A complete evaluation includes at minimum, Doppler waveform signals and systolic blood pressure reading at the level of bilateral brachial, anterior tibial, and posterior tibial arteries, when vessel segments are accessible. Bilateral testing is considered an integral part of a complete examination. Photoelectric Plethysmograph (PPG) waveforms and toe systolic pressure readings are included as required and additional duplex testing as needed. Limited examinations for reoccurring indications may be performed as noted.  ABI Findings: +---------+------------------+-----+---------+--------+ Right    Rt Pressure (mmHg)IndexWaveform Comment  +---------+------------------+-----+---------+--------+ Brachial 136                                      +---------+------------------+-----+---------+--------+ PTA      137               1.00 triphasic         +---------+------------------+-----+---------+--------+ DP       128               0.93 triphasic         +---------+------------------+-----+---------+--------+  Great Toe103               0.75                   +---------+------------------+-----+---------+--------+ +---------+------------------+-----+--------+-------+ Left     Lt Pressure (mmHg)IndexWaveformComment +---------+------------------+-----+--------+-------+ Brachial 137                                    +---------+------------------+-----+--------+-------+ PTA      130               0.95 biphasic        +---------+------------------+-----+--------+-------+ DP       116               0.85 biphasic        +---------+------------------+-----+--------+-------+ Great Toe87                0.64                 +---------+------------------+-----+--------+-------+ +-------+-----------+-----------+------------+------------+ ABI/TBIToday's ABIToday's TBIPrevious  ABIPrevious TBI +-------+-----------+-----------+------------+------------+ Right  1.00       0.75                                +-------+-----------+-----------+------------+------------+ Left   0.95       0.64                                +-------+-----------+-----------+------------+------------+   Summary: Right: Resting right ankle-brachial index is within normal range. The right toe-brachial index is normal. Left: Resting left ankle-brachial index is within normal range. The left toe-brachial index is abnormal. *See table(s) above for measurements and observations.  Electronically signed by Cordella Shawl MD on 07/24/2024 at 7:32:38 AM.    Final        Assessment & Plan:   1. Neurogenic claudication (Primary) Patient presents to clinic today with symptoms of claudication.  Patient underwent bilateral lower extremity arterial duplex ultrasounds with ABIs.  They were essentially normal today.  Right ABI today is 1.00.  Did not have previous ABI Left ABI today is 0.95.  Did not have previous ABI  Patient's right lower extremity waveforms are triphasic while his left lower extremity waveforms are biphasic.  These are essentially normal.  The symptoms the patient describing now appear to be neurogenic which may be arising from his spine.  The symptoms are mimicking vascular claudication but I believe they are not they are neurogenic in nature.  Patient will follow-up with vein and vascular surgery as needed if he feels he needs us .  2. Essential hypertension Continue antihypertensive medications as already ordered, these medications have been reviewed and there are no changes at this time.  3. Dyslipidemia Continue statin as ordered and reviewed, no changes at this time   Current Outpatient Medications on File Prior to Visit  Medication Sig Dispense Refill   amLODipine  (NORVASC ) 2.5 MG tablet Take 1 tablet (2.5 mg total) by mouth daily. 90 tablet 1   aspirin  EC 81 MG tablet  Take 1 tablet (81 mg total) by mouth daily. Swallow whole.     Bempedoic Acid-Ezetimibe  (NEXLIZET ) 180-10 MG TABS Take 1 tablet by mouth daily at 12 noon. 90 tablet 1   Berberine Chloride 500 MG CAPS Take 500 mg by mouth 2 (two) times daily.  Cholecalciferol (VITAMIN D3) 50 MCG (2000 UT) capsule Take 1 capsule (2,000 Units total) by mouth daily. 100 capsule 1   colchicine  0.6 MG tablet TAKE 1 TO 2 TABLETS BY MOUTH  DAILY AS NEEDED 90 tablet 0   Cyanocobalamin 5000 MCG TBDP Take 5,000 mcg by mouth daily.     dapagliflozin  propanediol (FARXIGA ) 10 MG TABS tablet Take 1 tablet (10 mg total) by mouth daily. 90 tablet 1   diclofenac  Sodium (VOLTAREN ) 1 % GEL APPLY 2 GRAMS TOPICALLY TO AFFECTED AREA FOUR TIMES DAILY (Patient taking differently: Apply 2 g topically daily as needed (pain).) 300 g 1   docusate sodium (COLACE) 100 MG capsule Take 100 mg by mouth daily.     EPINEPHrine  0.3 mg/0.3 mL IJ SOAJ injection Inject 0.3 mg into the muscle as needed for anaphylaxis. 1 each 0   febuxostat  (ULORIC ) 40 MG tablet Take 1 tablet (40 mg total) by mouth daily. For gout prevention 90 tablet 1   loratadine (ALAVERT) 10 MG dissolvable tablet Take 10 mg by mouth daily as needed for allergies.     metoprolol  succinate (TOPROL  XL) 25 MG 24 hr tablet Take 1 tablet (25 mg total) by mouth daily. 90 tablet 2   nitroGLYCERIN  (NITROSTAT ) 0.4 MG SL tablet Place 1 tablet (0.4 mg total) under the tongue every 5 (five) minutes as needed for chest pain. 45 tablet 3   olmesartan  (BENICAR ) 40 MG tablet Take 1 tablet (40 mg total) by mouth daily. 90 tablet 1   OVER THE COUNTER MEDICATION Apply 1 application  topically daily as needed (Leg pain). Neuropathy Cream     Polyvinyl Alcohol-Povidone PF (REFRESH) 1.4-0.6 % SOLN Place 1 drop into both eyes daily as needed (Dry eye).     tadalafil  (CIALIS ) 5 MG tablet Take 1 tablet (5 mg total) by mouth daily. 90 tablet 1   No current facility-administered medications on file prior to  visit.    There are no Patient Instructions on file for this visit. No follow-ups on file.   Gwendlyn JONELLE Shank, NP

## 2024-08-03 ENCOUNTER — Ambulatory Visit (INDEPENDENT_AMBULATORY_CARE_PROVIDER_SITE_OTHER): Admitting: Podiatry

## 2024-08-03 ENCOUNTER — Encounter: Payer: Self-pay | Admitting: Podiatry

## 2024-08-03 DIAGNOSIS — M79674 Pain in right toe(s): Secondary | ICD-10-CM | POA: Diagnosis not present

## 2024-08-03 DIAGNOSIS — M79675 Pain in left toe(s): Secondary | ICD-10-CM

## 2024-08-03 DIAGNOSIS — B351 Tinea unguium: Secondary | ICD-10-CM

## 2024-08-06 ENCOUNTER — Other Ambulatory Visit: Payer: Self-pay | Admitting: Family Medicine

## 2024-08-06 DIAGNOSIS — M109 Gout, unspecified: Secondary | ICD-10-CM

## 2024-08-09 ENCOUNTER — Encounter: Payer: Self-pay | Admitting: Podiatry

## 2024-08-09 NOTE — Progress Notes (Signed)
  Subjective:  Patient ID: Fernando Hart, male    DOB: [birth year],  MRN: [id number]  Fernando Hart presents to clinic today for painful mycotic toenails of both feet that are difficult to trim. Pain interferes with daily activities and wearing enclosed shoe gear comfortably. Patient had ABIs and has seen Vascular Surgery since his last appointment. Claudication is thought to be neurogenic in nature from Vascular Surgery standpoint. Chief Complaint  Patient presents with   RFC     RFC Non diabetic toenail trim. LOV with PCP 05/18/24.   New problem(s): None.   PCP is Sowles, Krichna, MD.  Allergies  Allergen Reactions   Allopurinol  Other (See Comments)    Chest pain, it has happened two separate times when he was given allopurinol     Shrimp (Diagnostic) Hives   Statins     Myalgias    Norvasc  [Amlodipine ] Swelling    Review of Systems: Negative except as noted in the HPI.  Objective: No changes noted in today's physical examination. There were no vitals filed for this visit. Fernando Hart is a pleasant 73 y.o. male in NAD. AAO x 3.  Vascular Examination: Capillary refill time immediate b/l. Faintly palpable pedal pulses. Pedal hair absent b/l. Pedal edema absent. No pain with calf compression b/l. Skin temperature gradient WNL b/l. No cyanosis or clubbing b/l. No ischemia or gangrene noted b/l.   Neurological Examination: Sensation grossly intact b/l with 10 gram monofilament.   Dermatological Examination: Pedal skin with normal turgor, texture and tone b/l.  No open wounds. No interdigital macerations.   Toenails 1-5 b/l thick, discolored, elongated with subungual debris and pain on dorsal palpation.   No hyperkeratotic nor porokeratotic lesions.  Musculoskeletal Examination: Muscle strength 5/5 to all lower extremity muscle groups bilaterally. No pain, crepitus or joint limitation noted with ROM b/l LE. No gross bony pedal deformities b/l. Patient ambulates independently  without assistive aids.  Radiographs: None  Last A1c:      Latest Ref Rng & Units 12/31/2023    9:17 AM  Hemoglobin A1C  Hemoglobin-A1c 4.8 - 5.6 % 6.2         Assessment/Plan: 1. Pain due to onychomycosis of toenails of both feet   Patient was evaluated and treated. All patient's and/or POA's questions/concerns addressed on today's visit. Toenails 1-5 debrided in length and girth without incident. Continue soft, supportive shoe gear daily. Report any pedal injuries to medical professional. Call office if there are any questions/concerns. -Patient/POA to call should there be question/concern in the interim.   Return in about 3 months (around 11/03/2024).  Fernando Hart, DPM      [facility] LOCATION: [address] [place] [address]                   Office [phone]   [facility] LOCATION: [address] [place], [place] [address] Office [phone]

## 2024-09-16 ENCOUNTER — Ambulatory Visit: Admitting: Dermatology

## 2024-09-16 DIAGNOSIS — L821 Other seborrheic keratosis: Secondary | ICD-10-CM

## 2024-09-16 DIAGNOSIS — L729 Follicular cyst of the skin and subcutaneous tissue, unspecified: Secondary | ICD-10-CM

## 2024-09-16 DIAGNOSIS — L820 Inflamed seborrheic keratosis: Secondary | ICD-10-CM | POA: Diagnosis not present

## 2024-09-16 DIAGNOSIS — Z7189 Other specified counseling: Secondary | ICD-10-CM

## 2024-09-16 DIAGNOSIS — L720 Epidermal cyst: Secondary | ICD-10-CM | POA: Diagnosis not present

## 2024-09-16 NOTE — Progress Notes (Unsigned)
   Follow-Up Visit   Subjective  Fernando Hart is a 73 y.o. male who presents for the following: 5 month ISK follow up.   The following portions of the chart were reviewed this encounter and updated as appropriate: medications, allergies, medical history  Review of Systems:  No other skin or systemic complaints except as noted in HPI or Assessment and Plan.  Objective  Well appearing patient in no apparent distress; mood and affect are within normal limits.  A focused examination was performed of the following areas: Chest, neck  Relevant exam findings are noted in the Assessment and Plan       Neck - Anterior x 17 (17) Stuck on waxy paps with erythema  Assessment & Plan   SEBORRHEIC KERATOSIS - Stuck-on, waxy, tan-brown papules and/or plaques  - Benign-appearing - Discussed benign etiology and prognosis. - Observe - Call for any changes - 17 ISK's removed w/Electrodesiccation in clinic  EPIDERMAL INCLUSION CYST Exam: 1.2cm Subcutaneous nodule at the right cheek Benign-appearing. Exam most consistent with an epidermal inclusion cyst. Discussed that a cyst is a benign growth that can grow over time and sometimes get irritated or inflamed. Recommend observation if it is not bothersome. Discussed option of surgical excision to remove it if it is growing, symptomatic, or other changes noted. Please call for new or changing lesions so they can be evaluated. Dr. Jackquline examined the patient today and we will schedule him for surgery for cyst on the right cheek   INFLAMED SEBORRHEIC KERATOSIS (17) Neck - Anterior x 17 (17) Symptomatic, irritating, patient would like treated. Destruction of lesion - Neck - Anterior x 17 (17) Complexity: simple   Destruction method comment:  Electrodesiccation Informed consent: discussed and consent obtained   Timeout:  patient name, date of birth, surgical site, and procedure verified Patient was prepped and draped in usual sterile fashion:  patient was prepped with isopropyl alcohol. Outcome: patient tolerated procedure well with no complications     Return in about 6 months (around 03/17/2025) for ISK, Schedule cyst removal surgery with Dr. Jackquline.  I, Emerick Ege, CMA am acting as scribe for Alm Rhyme, MD.   Documentation: I have reviewed the above documentation for accuracy and completeness, and I agree with the above.  Alm Rhyme, MD

## 2024-09-16 NOTE — Patient Instructions (Addendum)
 Wound Care Instructions  Cleanse wound gently with soap and water once a day then pat dry with clean gauze. Apply a thin coat of Petrolatum (petroleum jelly, Vaseline) over the wound (unless you have an allergy to this). We recommend that you use a new, sterile tube of Vaseline. Do not pick or remove scabs. Do not remove the yellow or white healing tissue from the base of the wound.  Cover the wound with fresh, clean, nonstick gauze and secure with paper tape. You may use Band-Aids in place of gauze and tape if the wound is small enough, but would recommend trimming much of the tape off as there is often too much. Sometimes Band-Aids can irritate the skin.  You should call the office for your biopsy report after 1 week if you have not already been contacted.  If you experience any problems, such as abnormal amounts of bleeding, swelling, significant bruising, significant pain, or evidence of infection, please call the office immediately.  FOR ADULT SURGERY PATIENTS: If you need something for pain relief you may take 1 extra strength Tylenol  (acetaminophen ) AND 2 Ibuprofen  (200mg  each) together every 4 hours as needed for pain. (do not take these if you are allergic to them or if you have a reason you should not take them.) Typically, you may only need pain medication for 1 to 3 days.      Pre-Operative Instructions You are scheduled for a surgical procedure at [facility]. We recommend you read the following instructions. If you have any questions or concerns, please call the office at [phone].  Shower and wash the entire body with soap and water the day of your surgery paying special attention to cleansing at and around the planned surgery site.  Please continue to take your anticoagulants (blood thinners) as you normally     would before and after surgery if they were prescribed by a medical provider. Stopping them could be harmful to you. We have multiple tools in  dermatology to stop the bleeding even if you take an anticoagulant. If you take over the counter blood thinner such as aspirin, Ibuprofen  (Motrin , Advil  and Nuprin ), Naprosyn, Voltaren, Relafen, etc. that was not prescribed or recommended by a medical provider, we recommend that you stop taking it for a week before your surgery and wait to restart until 2 days after your surgery.  Please inform us  of all medications you are currently taking. All medications that are taken regularly should be taken the day of surgery as you always do. Nevertheless, we need to be informed of what medications you are taking prior to surgery to know whether they will affect the procedure or cause any complications.   Please inform us  of any medication allergies. Also inform us  of whether you have allergies to Latex or rubber products or whether you have had any adverse reaction to Lidocaine  or Epinephrine .  Please inform us  of any prosthetic or artificial body parts such as artificial heart valve, joint replacements, etc., or similar condition that might require preoperative antibiotics.   We recommend avoidance of alcohol at least two weeks prior to surgery and continued avoidance for at least two weeks after surgery.   We recommend discontinuation of tobacco smoking at least two weeks prior to surgery and continued abstinence for at least two weeks after surgery.  Do not plan strenuous exercise, strenuous work or strenuous lifting for approximately four weeks after your surgery.   We request if you are unable to make your  scheduled surgical appointment, please call us  at least a week in advance or as soon as you are aware of a problem so that we can cancel or reschedule the appointment.   You MAY TAKE TYLENOL  (acetaminophen ) for pain as it is not a blood thinner.   PLEASE PLAN TO BE IN TOWN FOR TWO WEEKS FOLLOWING SURGERY, THIS IS IMPORTANT SO YOU CAN BE CHECKED FOR DRESSING CHANGES, FUTURE REMOVAL AND TO MONITOR FOR  POSSIBLE COMPLICATIONS.     Due to recent changes in healthcare laws, you may see results of your pathology and/or laboratory studies on MyChart before the doctors have had a chance to review them. We understand that in some cases there may be results that are confusing or concerning to you. Please understand that not all results are received at the same time and often the doctors may need to interpret multiple results in order to provide you with the best plan of care or course of treatment. Therefore, we ask that you please give us  2 business days to thoroughly review all your results before contacting the office for clarification. Should we see a critical lab result, you will be contacted sooner.   If You Need Anything After Your Visit  If you have any questions or concerns for your doctor, please call our main line at [phone] and press option 4 to reach your doctor's medical assistant. If no one answers, please leave a voicemail as directed and we will return your call as soon as possible. Messages left after 4 pm will be answered the following business day.   You may also send us  a message via MyChart. We typically respond to MyChart messages within 1-2 business days.  For prescription refills, please ask your pharmacy to contact our office. Our fax number is [phone].  If you have an urgent issue when the clinic is closed that cannot wait until the next business day, you can page your doctor at the number below.    Please note that while we do our best to be available for urgent issues outside of office hours, we are not available 24/7.   If you have an urgent issue and are unable to reach us , you may choose to seek medical care at your doctor's office, retail clinic, urgent care center, or emergency room.  If you have a medical emergency, please immediately call 911 or go to the emergency department.  Pager Numbers  - Dr. Hester: [phone]  - Dr. Jackquline:  [phone]  - Dr. Claudene: [phone]   - Dr. Raymund: [phone]  In the event of inclement weather, please call our main line at [phone] for an update on the status of any delays or closures.  Dermatology Medication Tips: Please keep the boxes that topical medications come in in order to help keep track of the instructions about where and how to use these. Pharmacies typically print the medication instructions only on the boxes and not directly on the medication tubes.   If your medication is too expensive, please contact our office at [phone] option 4 or send us  a message through MyChart.   We are unable to tell what your co-pay for medications will be in advance as this is different depending on your insurance coverage. However, we may be able to find a substitute medication at lower cost or fill out paperwork to get insurance to cover a needed medication.   If a prior authorization is required to get your medication covered by your insurance company,  please allow us  1-2 business days to complete this process.  Drug prices often vary depending on where the prescription is filled and some pharmacies may offer cheaper prices.  The website www.goodrx.com contains coupons for medications through different pharmacies. The prices here do not account for what the cost may be with help from insurance (it may be cheaper with your insurance), but the website can give you the price if you did not use any insurance.  - You can print the associated coupon and take it with your prescription to the pharmacy.  - You may also stop by our office during regular business hours and pick up a GoodRx coupon card.  - If you need your prescription sent electronically to a different pharmacy, notify our office through [facility] or by phone at [phone] option 4.     Si Usted Necesita Algo Despus de Su Visita  Tambin puede enviarnos un mensaje a travs de Clinical cytogeneticist. Por lo general  respondemos a los mensajes de MyChart en el transcurso de 1 a 2 das hbiles.  Para renovar recetas, por favor pida a su farmacia que se ponga en contacto con nuestra oficina. Randi lakes de fax es [place] [phone].  Si tiene un asunto urgente cuando la clnica est cerrada y que no puede esperar hasta el siguiente da hbil, puede llamar/localizar a su doctor(a) al nmero que aparece a continuacin.   Por favor, tenga en cuenta que aunque hacemos todo lo posible para estar disponibles para asuntos urgentes fuera del horario de [place], no estamos disponibles las 24 horas del da, los 7 [address]  [address] de la [place].   Si tiene un problema urgente y no puede comunicarse con nosotros, puede optar por buscar atencin mdica  en el consultorio de su doctor(a), en una clnica privada, en un centro de atencin urgente o en una sala de emergencias.  Si tiene Engineer, drilling, por favor llame inmediatamente al 911 o vaya a la sala de emergencias.  Nmeros de bper  - Dr. Hester: [phone]  - Dra. Jackquline: [phone]  - Dr. Claudene: [phone]  - Dra. Kitts: [phone]  En caso de inclemencias del [place], por favor llame a nuestra lnea principal al [phone] para una actualizacin sobre el estado de cualquier retraso o cierre.  Consejos para la medicacin en dermatologa: Por favor, guarde las cajas en las que vienen los medicamentos de uso tpico para ayudarle a seguir las instrucciones sobre dnde y cmo usarlos. Las farmacias generalmente imprimen las instrucciones del medicamento slo en las cajas y no directamente en los tubos del [place].   Si su medicamento es muy caro, por favor, pngase en contacto con landry rieger llamando al [phone] y presione la opcin 4 o envenos un mensaje a travs de Clinical cytogeneticist.   No podemos decirle cul ser su copago por los medicamentos por adelantado ya que esto es diferente dependiendo de la cobertura de su seguro. Sin embargo, es posible  que podamos encontrar un medicamento sustituto a Audiological scientist un formulario para que el seguro cubra el medicamento que se considera necesario.   Si se requiere una autorizacin previa para que su compaa de seguros malta su medicamento, por favor permtanos de 1 a 2 das hbiles para completar este proceso.  Los precios de los medicamentos varan con frecuencia dependiendo del Environmental consultant de dnde se surte la receta y alguna farmacias pueden ofrecer precios ms baratos.  El sitio web www.goodrx.com tiene cupones para medicamentos de Health and safety inspector. Los precios aqu  no tienen en cuenta lo que podra costar con la ayuda del seguro (puede ser ms barato con su seguro), pero el sitio web puede darle el precio si no Visual merchandiser.  - Puede imprimir el cupn correspondiente y llevarlo con su receta a la farmacia.  - Tambin puede pasar por nuestra oficina durante el horario de atencin regular y Education officer, museum una tarjeta de cupones de GoodRx.  - Si necesita que su receta se enve electrnicamente a una farmacia diferente, informe a nuestra oficina a travs de MyChart de [place] o por telfono llamando al [phone] y presione la opcin 4.

## 2024-09-17 ENCOUNTER — Encounter: Payer: Self-pay | Admitting: Dermatology

## 2024-10-05 LAB — FECAL OCCULT BLOOD, GUAIAC: Fecal Occult Blood: NEGATIVE

## 2024-10-14 ENCOUNTER — Other Ambulatory Visit: Payer: Self-pay | Admitting: Family Medicine

## 2024-10-14 ENCOUNTER — Other Ambulatory Visit: Payer: Self-pay | Admitting: Internal Medicine

## 2024-10-14 DIAGNOSIS — I10 Essential (primary) hypertension: Secondary | ICD-10-CM

## 2024-10-14 DIAGNOSIS — N528 Other male erectile dysfunction: Secondary | ICD-10-CM

## 2024-10-14 DIAGNOSIS — N1831 Chronic kidney disease, stage 3a: Secondary | ICD-10-CM

## 2024-10-20 ENCOUNTER — Encounter: Payer: Self-pay | Admitting: Family Medicine

## 2024-10-20 ENCOUNTER — Ambulatory Visit: Admitting: Family Medicine

## 2024-10-20 VITALS — BP 138/76 | HR 97 | Resp 16 | Ht 68.0 in | Wt 198.8 lb

## 2024-10-20 DIAGNOSIS — R7989 Other specified abnormal findings of blood chemistry: Secondary | ICD-10-CM

## 2024-10-20 DIAGNOSIS — I25118 Atherosclerotic heart disease of native coronary artery with other forms of angina pectoris: Secondary | ICD-10-CM | POA: Diagnosis not present

## 2024-10-20 DIAGNOSIS — Z1211 Encounter for screening for malignant neoplasm of colon: Secondary | ICD-10-CM

## 2024-10-20 DIAGNOSIS — I129 Hypertensive chronic kidney disease with stage 1 through stage 4 chronic kidney disease, or unspecified chronic kidney disease: Secondary | ICD-10-CM | POA: Diagnosis not present

## 2024-10-20 DIAGNOSIS — E785 Hyperlipidemia, unspecified: Secondary | ICD-10-CM

## 2024-10-20 DIAGNOSIS — M109 Gout, unspecified: Secondary | ICD-10-CM

## 2024-10-20 DIAGNOSIS — E559 Vitamin D deficiency, unspecified: Secondary | ICD-10-CM

## 2024-10-20 DIAGNOSIS — N1831 Chronic kidney disease, stage 3a: Secondary | ICD-10-CM | POA: Diagnosis not present

## 2024-10-20 DIAGNOSIS — Z7984 Long term (current) use of oral hypoglycemic drugs: Secondary | ICD-10-CM

## 2024-10-20 DIAGNOSIS — R7303 Prediabetes: Secondary | ICD-10-CM

## 2024-10-20 DIAGNOSIS — Z23 Encounter for immunization: Secondary | ICD-10-CM | POA: Diagnosis not present

## 2024-10-20 DIAGNOSIS — G720 Drug-induced myopathy: Secondary | ICD-10-CM

## 2024-10-20 DIAGNOSIS — Z91013 Allergy to seafood: Secondary | ICD-10-CM | POA: Insufficient documentation

## 2024-10-20 DIAGNOSIS — N528 Other male erectile dysfunction: Secondary | ICD-10-CM

## 2024-10-20 MED ORDER — METOPROLOL SUCCINATE ER 50 MG PO TB24: 50.0000 mg | ORAL_TABLET | Freq: Every day | ORAL | 1 refills | Status: AC

## 2024-10-20 MED ORDER — NEXLIZET 180-10 MG PO TABS: 1.0000 | ORAL_TABLET | ORAL | 0 refills | Status: AC

## 2024-10-20 MED ORDER — DAPAGLIFLOZIN PROPANEDIOL 10 MG PO TABS: 10.0000 mg | ORAL_TABLET | Freq: Every day | ORAL | 1 refills | Status: AC

## 2024-10-20 NOTE — Progress Notes (Signed)
 Name: Fernando Hart   MRN: [id number]    DOB: Jun 17, 1951   Date:10/20/2024       Progress Note  Subjective  Chief Complaint  Chief Complaint  Patient presents with   Medical Management of Chronic Issues    Nexlizet  pt has not been taking it daily due to body pain   Discussed the use of AI scribe software for clinical note transcription with the patient, who gave verbal consent to proceed.  History of Present Illness Fernando Hart is a 73 year old male with prediabetes who presents for a follow-up visit.  His A1c was 6.2 in January. He has not made significant dietary changes, although he avoids sweet beverages and primarily drinks water. He feels hungry all the time but denies excessive thirst beyond his normal. No symptoms of diabetes such as increased thirst or hunger beyond his usual.  He has chronic kidney disease stage 3A. His eGFR improved from 47 in January to 87 in June. He is taking Farxiga  and Benicar   . His uric acid level decreased from 9 in January to 2.3, and he has not experienced gout symptoms. He was previously on Uloric  to manage uric acid levels but has not been taking it recently.  He has hypertension and is currently on olmesartan  40 mg  , amlodipine  2.5 mg and metoprolol  25 mg . His blood pressure has increased from previous readings below 120 to 130's range. He experiences some swelling with higher doses of amlodipine  in the past. His heart rate is noted to be 97.  He has a history of coronary artery disease but no current chest pain, he has SOB with activity but it has been stable He experiences shortness of breath with exertion, such as making his bed.  He has a history of high cholesterol but reports adverse effects from cholesterol medications, including knee pain. He has tried various statins and Zetia , currently only able to take Nezlizet three times a week otherwise he has pain.  He completed a fecal globulin test for colon cancer screening and has the  results at home, which he plans to send to the office.    Patient Active Problem List   Diagnosis Date Noted   Coronary artery disease of native artery of native heart with stable angina pectoris 05/18/2024   Statin myopathy 05/18/2024   Low vitamin B12 level 05/18/2024   Elevated uric acid in blood 05/18/2024   Primary osteoarthritis of both knees 05/18/2024   Claudication 05/18/2024   Chest pain 12/06/2023   Abnormal cardiac CT angiography 12/06/2023   Stage 3a chronic kidney disease (HCC) 11/04/2020   Vitamin D  deficiency 11/04/2020   Intermittent low back pain 03/24/2018   Tinnitus of right ear 07/12/2017   Dyslipidemia 10/11/2016   Obesity 10/11/2016   Degenerative joint disease of right hip 02/28/2016   Prediabetes 01/10/2016   Controlled gout 01/10/2016   Essential hypertension 01/10/2016    Past Surgical History:  Procedure Laterality Date   LEFT HEART CATH AND CORONARY ANGIOGRAPHY Left 12/06/2023   Procedure: LEFT HEART CATH AND CORONARY ANGIOGRAPHY;  Surgeon: Mady Bruckner, MD;  Location: [facility] INVASIVE CV LAB;  Service: Cardiovascular;  Laterality: Left;   ROOT CANAL      Family History  Problem Relation Age of Onset   Hypertension Mother        Died in her late 80s   Diabetes Father        Died at 75   Diabetes Sister    GI  Bleed Daughter     Social History   Tobacco Use   Smoking status: Former    Current packs/day: 0.00    Average packs/day: 1 pack/day for 35.0 years (35.0 ttl pk-yrs)    Types: Cigarettes    Start date: 07/1969    Quit date: 07/2004    Years since quitting: 20.2   Smokeless tobacco: Never  Substance Use Topics   Alcohol use: Not Currently    Alcohol/week: 2.0 standard drinks of alcohol    Types: 2 Cans of beer per week    Comment: not every week     Current Outpatient Medications:    amLODipine  (NORVASC ) 2.5 MG tablet, Take 1 tablet (2.5 mg total) by mouth daily., Disp: 90 tablet, Rfl: 1   aspirin  EC 81 MG tablet, Take 1  tablet (81 mg total) by mouth daily. Swallow whole., Disp: , Rfl:    Berberine Chloride 500 MG CAPS, Take 500 mg by mouth 2 (two) times daily., Disp: , Rfl:    Cholecalciferol (VITAMIN D3) 50 MCG (2000 UT) capsule, Take 1 capsule (2,000 Units total) by mouth daily., Disp: 100 capsule, Rfl: 1   colchicine  0.6 MG tablet, Take 1-2 tablets (0.6-1.2 mg total) by mouth daily as needed. For acute gout only, take 2 at once and may take one more 1 hour later if still in pain, Disp: 30 tablet, Rfl: 0   Cyanocobalamin 5000 MCG TBDP, Take 5,000 mcg by mouth daily., Disp: , Rfl:    dapagliflozin  propanediol (FARXIGA ) 10 MG TABS tablet, Take 1 tablet (10 mg total) by mouth daily., Disp: 90 tablet, Rfl: 1   diclofenac  Sodium (VOLTAREN ) 1 % GEL, APPLY 2 GRAMS TOPICALLY TO AFFECTED AREA FOUR TIMES DAILY (Patient taking differently: Apply 2 g topically daily as needed (pain).), Disp: 300 g, Rfl: 1   docusate sodium (COLACE) 100 MG capsule, Take 100 mg by mouth daily., Disp: , Rfl:    EPINEPHrine  0.3 mg/0.3 mL IJ SOAJ injection, Inject 0.3 mg into the muscle as needed for anaphylaxis., Disp: 1 each, Rfl: 0   febuxostat  (ULORIC ) 40 MG tablet, Take 1 tablet (40 mg total) by mouth daily. For gout prevention, Disp: 90 tablet, Rfl: 1   loratadine (ALAVERT) 10 MG dissolvable tablet, Take 10 mg by mouth daily as needed for allergies., Disp: , Rfl:    metoprolol  succinate (TOPROL -XL) 25 MG 24 hr tablet, TAKE 1 TABLET BY MOUTH DAILY, Disp: 90 tablet, Rfl: 2   nitroGLYCERIN  (NITROSTAT ) 0.4 MG SL tablet, Place 1 tablet (0.4 mg total) under the tongue every 5 (five) minutes as needed for chest pain., Disp: 45 tablet, Rfl: 3   olmesartan  (BENICAR ) 40 MG tablet, Take 1 tablet (40 mg total) by mouth daily., Disp: 90 tablet, Rfl: 1   OVER THE COUNTER MEDICATION, Apply 1 application  topically daily as needed (Leg pain). Neuropathy Cream, Disp: , Rfl:    Polyvinyl Alcohol-Povidone PF (REFRESH) 1.4-0.6 % SOLN, Place 1 drop into both eyes  daily as needed (Dry eye)., Disp: , Rfl:    tadalafil  (CIALIS ) 5 MG tablet, Take 1 tablet (5 mg total) by mouth daily., Disp: 90 tablet, Rfl: 1   Bempedoic Acid-Ezetimibe  (NEXLIZET ) 180-10 MG TABS, Take 1 tablet by mouth daily at 12 noon. (Patient not taking: Reported on 10/20/2024), Disp: 90 tablet, Rfl: 1  Allergies  Allergen Reactions   Allopurinol  Other (See Comments)    Chest pain, it has happened two separate times when he was given allopurinol     Shrimp (  Diagnostic) Hives   Statins     Myalgias    Norvasc  [Amlodipine ] Swelling    I personally reviewed active problem list, medication list, allergies, family history with the patient/caregiver today.   ROS  Ten systems reviewed and is negative except as mentioned in HPI    Objective Physical Exam VITALS: P- 97, BP- 132/72 CONSTITUTIONAL: Patient appears well-developed and well-nourished.  No distress. HEENT: Head atraumatic, normocephalic, neck supple. CARDIOVASCULAR: Normal rate, regular rhythm and normal heart sounds.  No murmur heard. No BLE edema. PULMONARY: Effort normal and breath sounds normal. No respiratory distress. ABDOMINAL: There is no tenderness or distention. MUSCULOSKELETAL: Normal gait. Without gross motor or sensory deficit. PSYCHIATRIC: Patient has a normal mood and affect. behavior is normal. Judgment and thought content normal.  Vitals:   10/20/24 0843  BP: 132/72  Pulse: 97  Resp: 16  SpO2: 97%  Weight: 198 lb 12.8 oz (90.2 kg)  Height: 5' 8 (1.727 m)    Body mass index is 30.23 kg/m.  Recent Results (from the past 2160 hours)  VAS US  ABI WITH/WO TBI     Status: None   Collection Time: 07/22/24  2:27 PM  Result Value Ref Range   Right ABI 1.00    Left ABI 0.95      PHQ2/9:    10/20/2024    8:37 AM 05/18/2024    8:57 AM 01/03/2024    8:19 AM 10/03/2023    8:34 AM 06/27/2023    9:36 AM  Depression screen PHQ 2/9  Decreased Interest 0 0 0 0 0  Down, Depressed, Hopeless 0 0 0 0 0  PHQ  - 2 Score 0 0 0 0 0  Altered sleeping   0 0 0  Tired, decreased energy   0 0 0  Change in appetite   0 0 0  Feeling bad or failure about yourself    0 0 0  Trouble concentrating   0 0 0  Moving slowly or fidgety/restless   0 0 0  Suicidal thoughts   0 0 0  PHQ-9 Score   0 0 0  Difficult doing work/chores   Not difficult at all      phq 9 is negative  Fall Risk:    10/20/2024    8:37 AM 05/18/2024    8:57 AM 01/03/2024    8:15 AM 10/03/2023    8:34 AM 06/27/2023    9:35 AM  Fall Risk   Falls in the past year? 0 0 0 0 0  Number falls in past yr: 0 0 0    Injury with Fall? 0 0 0    Risk for fall due to : No Fall Risks No Fall Risks No Fall Risks No Fall Risks No Fall Risks  Follow up Falls evaluation completed Falls prevention discussed;Education provided;Falls evaluation completed Falls prevention discussed;Education provided;Falls evaluation completed Falls prevention discussed Falls prevention discussed      Assessment & Plan Chronic kidney disease stage 3a with hypertension CKD stage 3a with improved eGFR. Blood pressure slightly elevated. Elevated heart rate. Current medications include olmesartan . Metoprolol  dose adjustment considered. - Increased metoprolol  from 25 mg to 50 mg daily. - Checked blood pressure before leaving the clinic.  Atherosclerotic heart disease of native coronary artery Atherosclerotic heart disease. Shortness of breath with exertion noted. Discussed potential for work accommodations due to exertional symptoms. - Provided note for work accommodations to work from home.  Prediabetes A1c of 6.2. Discussed dietary modifications to reduce sugar  intake. Farxiga  prescribed for diabetes and kidney protection. - Continue Farxiga  for diabetes and kidney protection. - Encouraged reduction of sugar intake, especially in coffee.  Hyperlipidemia with drug-induced myopathy Hyperlipidemia with intolerance to statins and Zetia .  -continue Nexlizet  three times a  week   Gout Uric acid level reduced to 2.3 with Uloric  No recent gout attacks. Discussed importance of maintaining uric acid levels below 6. - Resume Uloric  to maintain uric acid levels below 6.  Obesity BMI almost below 30. Discussed dietary modifications to reduce sugar intake and improve health. - Encouraged reduction of sugar intake to aid in weight management.  General Health Maintenance Colon cancer screening completed. Discussed importance of maintaining a healthy lifestyle and regular screenings. - Will send fecal immunochemical test results to the clinic. - Continue regular health screenings. - we will write a letter for accommodations for work due to SOB with activity but also increase risk of URI due to open desks at work

## 2024-10-28 ENCOUNTER — Ambulatory Visit: Admitting: Dermatology

## 2024-10-28 ENCOUNTER — Encounter: Payer: Self-pay | Admitting: Dermatology

## 2024-10-28 DIAGNOSIS — L720 Epidermal cyst: Secondary | ICD-10-CM

## 2024-10-28 DIAGNOSIS — D492 Neoplasm of unspecified behavior of bone, soft tissue, and skin: Secondary | ICD-10-CM

## 2024-10-28 NOTE — Patient Instructions (Signed)

## 2024-10-28 NOTE — Progress Notes (Signed)
   Follow-Up Visit   Subjective  Fernando Hart is a 73 y.o. male who presents for the following: Cyst R cheek, to be excised today   The following portions of the chart were reviewed this encounter and updated as appropriate: medications, allergies, medical history  Review of Systems:  No other skin or systemic complaints except as noted in HPI or Assessment and Plan.  Objective  Well appearing patient in no apparent distress; mood and affect are within normal limits.   A focused examination was performed of the following areas: face  Relevant exam findings are noted in the Assessment and Plan.  R cheek Sq nodule 1.2cm   Assessment & Plan     NEOPLASM OF SKIN R cheek Skin excision  Excision method:  elliptical Lesion length (cm):  1.2 Lesion width (cm):  1.2 Margin per side (cm):  0.1 Total excision diameter (cm):  1.4 Informed consent: discussed and consent obtained   Timeout: patient name, date of birth, surgical site, and procedure verified   Procedure prep:  Patient was prepped and draped in usual sterile fashion Prep type:  Povidone-iodine Anesthesia: the lesion was anesthetized in a standard fashion   Anesthetic:  1% lidocaine  w/ epinephrine  1-100,000 buffered w/ 8.4% NaHCO3 (6.0cc lido w/ epi) Instrument used comment:  #15c blade Hemostasis achieved with: pressure and electrodesiccation   Outcome: patient tolerated procedure well with no complications    Skin repair Complexity:  Intermediate Final length (cm):  1.3 Informed consent: discussed and consent obtained   Reason for type of repair: reduce tension to allow closure, reduce the risk of dehiscence, infection, and necrosis, reduce subcutaneous dead space and avoid a hematoma, preserve normal anatomical and functional relationships and enhance both functionality and cosmetic results   Undermining: edges undermined   Subcutaneous layers (deep stitches):  Suture size:  5-0 Suture type: Vicryl  (polyglactin 910)   Subcutaneous suture technique: inverted dermal. Fine/surface layer approximation (top stitches):  Suture size:  5-0 Suture type: nylon   Stitches: simple interrupted   Suture removal (days):  7 Hemostasis achieved with: suture Outcome: patient tolerated procedure well with no complications   Post-procedure details: sterile dressing applied and wound care instructions given   Dressing type: pressure dressing (Mupirocin)    Anatomic Pathology Report  Return in about 1 week (around 11/04/2024) for suture removal.  I, Sonya Hupman, RMA, am acting as scribe for Rexene Rattler, MD .   Documentation: I have reviewed the above documentation for accuracy and completeness, and I agree with the above.  Rexene Rattler, MD

## 2024-11-04 ENCOUNTER — Ambulatory Visit: Admitting: Dermatology

## 2024-11-04 DIAGNOSIS — L729 Follicular cyst of the skin and subcutaneous tissue, unspecified: Secondary | ICD-10-CM

## 2024-11-04 DIAGNOSIS — Z48817 Encounter for surgical aftercare following surgery on the skin and subcutaneous tissue: Secondary | ICD-10-CM

## 2024-11-04 NOTE — Progress Notes (Signed)
   Follow-Up Visit   Subjective  Colonel Krauser is a 73 y.o. male who presents for the following: Suture removal  Pathology showed pending  The following portions of the chart were reviewed this encounter and updated as appropriate: medications, allergies, medical history  Review of Systems:  No other skin or systemic complaints except as noted in HPI or Assessment and Plan.  Objective  Well appearing patient in no apparent distress; mood and affect are within normal limits.  Areas Examined: Right cheek  Relevant physical exam findings are noted in the Assessment and Plan.    Assessment & Plan    Encounter for Removal of Sutures - Incision site is clean, dry and intact. - Wound cleansed, sutures removed, wound cleansed and steri strips applied.  - Discussed pathology results showing *pending- will call with results - Patient advised to keep steri-strips dry until they fall off. - Scars remodel for a full year. - Once steri-strips fall off, patient can apply over-the-counter silicone scar cream once to twice a day to help with scar remodeling if desired. - Patient advised to call with any concerns or if they notice any new or changing lesions.  Return as scheduled with Dr Hester.  IAndrea Kerns, CMA, am acting as scribe for Rexene Rattler, MD .   Documentation: I have reviewed the above documentation for accuracy and completeness, and I agree with the above.  Rexene Rattler, MD

## 2024-11-04 NOTE — Patient Instructions (Addendum)
 After Suture Removal  If your medical team has placed Steri-Strips (white adhesive strips covering the surgical site to provide extra support): Keep the area dry until they fall off.  Do not peel them off. Just let them fall off on their own.  If the edges peel up, you can trim them with scissors.   If your team has not placed Steri-Strips: Wash the area daily with soap and water. Then coat the incision site with plain Vaseline and cover with a bandage. Do this daily for 5 days after the sutures are removed. After that, no additional wound care is generally needed.  However, if you would like to help fade the scar, you can apply a silicone scar cream, gel or sheet every night. The scar will remodel for one year after the procedure. If a skin cancer was removed, be sure to keep your appointment with your dermatologist for follow-up and let your dermatology team know if you have any new or changing spots between visits.    Please call our office at [phone] for any questions or concerns.  Due to recent changes in healthcare laws, you may see results of your pathology and/or laboratory studies on MyChart before the doctors have had a chance to review them. We understand that in some cases there may be results that are confusing or concerning to you. Please understand that not all results are received at the same time and often the doctors may need to interpret multiple results in order to provide you with the best plan of care or course of treatment. Therefore, we ask that you please give us  2 business days to thoroughly review all your results before contacting the office for clarification. Should we see a critical lab result, you will be contacted sooner.   If You Need Anything After Your Visit  If you have any questions or concerns for your doctor, please call our main line at [phone] and press option 4 to reach your doctor's medical assistant. If no one answers, please leave a  voicemail as directed and we will return your call as soon as possible. Messages left after 4 pm will be answered the following business day.   You may also send us  a message via MyChart. We typically respond to MyChart messages within 1-2 business days.  For prescription refills, please ask your pharmacy to contact our office. Our fax number is [phone].  If you have an urgent issue when the clinic is closed that cannot wait until the next business day, you can page your doctor at the number below.    Please note that while we do our best to be available for urgent issues outside of office hours, we are not available 24/7.   If you have an urgent issue and are unable to reach us , you may choose to seek medical care at your doctor's office, retail clinic, urgent care center, or emergency room.  If you have a medical emergency, please immediately call 911 or go to the emergency department.  Pager Numbers  - Dr. Hester: [phone]  - Dr. Jackquline: [phone]  - Dr. Claudene: [phone]   - Dr. Raymund: [phone]  In the event of inclement weather, please call our main line at [phone] for an update on the status of any delays or closures.  Dermatology Medication Tips: Please keep the boxes that topical medications come in in order to help keep track of the instructions about where and how to use these. Pharmacies typically print the  medication instructions only on the boxes and not directly on the medication tubes.   If your medication is too expensive, please contact our office at [phone] option 4 or send us  a message through MyChart.   We are unable to tell what your co-pay for medications will be in advance as this is different depending on your insurance coverage. However, we may be able to find a substitute medication at lower cost or fill out paperwork to get insurance to cover a needed medication.   If a prior authorization is required to get your medication  covered by your insurance company, please allow us  1-2 business days to complete this process.  Drug prices often vary depending on where the prescription is filled and some pharmacies may offer cheaper prices.  The website www.goodrx.com contains coupons for medications through different pharmacies. The prices here do not account for what the cost may be with help from insurance (it may be cheaper with your insurance), but the website can give you the price if you did not use any insurance.  - You can print the associated coupon and take it with your prescription to the pharmacy.  - You may also stop by our office during regular business hours and pick up a GoodRx coupon card.  - If you need your prescription sent electronically to a different pharmacy, notify our office through [facility] or by phone at [phone] option 4.     Si Usted Necesita Algo Despus de Su Visita  Tambin puede enviarnos un mensaje a travs de Clinical cytogeneticist. Por lo general respondemos a los mensajes de MyChart en el transcurso de 1 a 2 das hbiles.  Para renovar recetas, por favor pida a su farmacia que se ponga en contacto con nuestra oficina. Randi lakes de fax es [place] [phone].  Si tiene un asunto urgente cuando la clnica est cerrada y que no puede esperar hasta el siguiente da hbil, puede llamar/localizar a su doctor(a) al nmero que aparece a continuacin.   Por favor, tenga en cuenta que aunque hacemos todo lo posible para estar disponibles para asuntos urgentes fuera del horario de [place], no estamos disponibles las 24 horas del da, los 7 [address]  [address] de la [place].   Si tiene un problema urgente y no puede comunicarse con nosotros, puede optar por buscar atencin mdica  en el consultorio de su doctor(a), en una clnica privada, en un centro de atencin urgente o en una sala de emergencias.  Si tiene Engineer, drilling, por favor llame inmediatamente al 911 o vaya a la sala de  emergencias.  Nmeros de bper  - Dr. Hester: [phone]  - Dra. Jackquline: [phone]  - Dr. Claudene: [phone]  - Dra. Kitts: [phone]  En caso de inclemencias del [place], por favor llame a nuestra lnea principal al [phone] para una actualizacin sobre el estado de cualquier retraso o cierre.  Consejos para la medicacin en dermatologa: Por favor, guarde las cajas en las que vienen los medicamentos de uso tpico para ayudarle a seguir las instrucciones sobre dnde y cmo usarlos. Las farmacias generalmente imprimen las instrucciones del medicamento slo en las cajas y no directamente en los tubos del [place].   Si su medicamento es muy caro, por favor, pngase en contacto con landry rieger llamando al [phone] y presione la opcin 4 o envenos un mensaje a travs de Clinical cytogeneticist.   No podemos decirle cul ser su copago por los medicamentos por adelantado ya que esto es diferente dependiendo de la  cobertura de su seguro. Sin embargo, es posible que podamos encontrar un medicamento sustituto a Audiological scientist un formulario para que el seguro cubra el medicamento que se considera necesario.   Si se requiere una autorizacin previa para que su compaa de seguros malta su medicamento, por favor permtanos de 1 a 2 das hbiles para completar este proceso.  Los precios de los medicamentos varan con frecuencia dependiendo del Environmental consultant de dnde se surte la receta y alguna farmacias pueden ofrecer precios ms baratos.  El sitio web www.goodrx.com tiene cupones para medicamentos de Health and safety inspector. Los precios aqu no tienen en cuenta lo que podra costar con la ayuda del seguro (puede ser ms barato con su seguro), pero el sitio web puede darle el precio si no utiliz Tourist information centre manager.  - Puede imprimir el cupn correspondiente y llevarlo con su receta a la farmacia.  - Tambin puede pasar por nuestra oficina durante el horario de atencin regular y Education officer, museum una tarjeta  de cupones de GoodRx.  - Si necesita que su receta se enve electrnicamente a una farmacia diferente, informe a nuestra oficina a travs de MyChart de [place] o por telfono llamando al [phone] y presione la opcin 4.

## 2024-11-05 LAB — ANATOMIC PATHOLOGY REPORT

## 2024-11-09 ENCOUNTER — Ambulatory Visit: Payer: Self-pay | Admitting: Dermatology

## 2024-11-09 ENCOUNTER — Ambulatory Visit: Admitting: Podiatry

## 2024-11-09 DIAGNOSIS — M79675 Pain in left toe(s): Secondary | ICD-10-CM | POA: Diagnosis not present

## 2024-11-09 DIAGNOSIS — M79674 Pain in right toe(s): Secondary | ICD-10-CM | POA: Diagnosis not present

## 2024-11-09 DIAGNOSIS — B351 Tinea unguium: Secondary | ICD-10-CM | POA: Diagnosis not present

## 2024-11-09 NOTE — Telephone Encounter (Signed)
 Tried to call patient with results, no answer and not able to leave a message.

## 2024-11-09 NOTE — Telephone Encounter (Signed)
-----   Message from Rexene Rattler sent at 11/09/2024 12:12 PM EST ----- 1-Right Cheek ,Skin Excision: FOLLICULAR CYST, INFUNDIBULAR TYPE (EPIDERMOID CYST).    Benign cyst- please call patient ----- Message ----- From: Interface, [facility] Lab Results In Sent: 11/05/2024   2:37 PM EST To: Rexene Rattler, MD

## 2024-11-10 NOTE — Telephone Encounter (Signed)
 Advised patient excision of the right cheek was benign cyst.

## 2024-11-15 ENCOUNTER — Encounter: Payer: Self-pay | Admitting: Podiatry

## 2024-11-15 NOTE — Progress Notes (Signed)
  Subjective:  Patient ID: Fernando Hart, male    DOB: 1951/06/03,  MRN: [id number]  Fernando Hart presents to clinic today for painful thick toenails that are difficult to trim. Pain interferes with ambulation. Aggravating factors include wearing enclosed shoe gear. Pain is relieved with periodic professional debridement.  Chief Complaint  Patient presents with   Toe Pain    He has a visit with Dr. Glenard in Feb 2026. He denies being diabetic   New problem(s): None.   PCP is Sowles, Krichna, MD.  Allergies  Allergen Reactions   Allopurinol  Other (See Comments)    Chest pain, it has happened two separate times when he was given allopurinol     Shrimp (Diagnostic) Hives   Statins     Myalgias    Norvasc  [Amlodipine ] Swelling    Review of Systems: Negative except as noted in the HPI.  Objective: No changes noted in today's physical examination. There were no vitals filed for this visit. Fernando Hart is a pleasant 73 y.o. male in NAD. AAO x 3.  Vascular Examination: Capillary refill time immediate b/l. Faintly palpable pedal pulses. Pedal hair absent b/l. Pedal edema absent. No pain with calf compression b/l. Skin temperature gradient WNL b/l. No cyanosis or clubbing b/l. No ischemia or gangrene noted b/l.   Neurological Examination: Sensation grossly intact b/l with 10 gram monofilament.   Dermatological Examination: Pedal skin with normal turgor, texture and tone b/l.  No open wounds. No interdigital macerations.   Toenails 1-5 b/l thick, discolored, elongated with subungual debris and pain on dorsal palpation.   No hyperkeratotic nor porokeratotic lesions.  Musculoskeletal Examination: Muscle strength 5/5 to all lower extremity muscle groups bilaterally. No pain, crepitus or joint limitation noted with ROM b/l LE. No gross bony pedal deformities b/l. Patient ambulates independently without assistive aids.  Radiographs: None  Assessment/Plan: 1. Pain due to  onychomycosis of toenails of both feet   Consent given for treatment. Patient examined. All patient's and/or POA's questions/concerns addressed on today's visit. Mycotic toenails 1-5 b/l debrided in length and girth without incident. Continue soft, supportive shoe gear daily. Report any pedal injuries to medical professional. Call office if there are any quesitons/concerns. -Patient/POA to call should there be question/concern in the interim.   Return in about 3 months (around 02/09/2025).  Delon LITTIE Merlin, DPM      [place] LOCATION: [address] [place] [address]                   Office [phone]   [facility] LOCATION: [address] [place], [place] [address] Office [phone]

## 2024-12-25 ENCOUNTER — Other Ambulatory Visit: Payer: Self-pay | Admitting: Family Medicine

## 2024-12-25 DIAGNOSIS — M109 Gout, unspecified: Secondary | ICD-10-CM

## 2024-12-25 NOTE — Telephone Encounter (Signed)
 Requested by interface surescripts. Future visit 01/20/25. Requested Prescriptions  Pending Prescriptions Disp Refills   febuxostat  (ULORIC ) 40 MG tablet [Pharmacy Med Name: FEBUXOSTAT   40MG   TAB] 90 tablet 1    Sig: TAKE 1 TABLET BY MOUTH DAILY FOR GOUT PREVENTION     Endocrinology: Gout Agents - febuxostat  Failed - 12/25/2024  3:59 PM      Failed - Uric Acid in normal range and within 360 days    Uric Acid  Date Value Ref Range Status  05/20/2024 2.3 (L) 3.8 - 8.4 mg/dL Final    Comment:               Therapeutic target for gout patients: <6.0         Failed - Cr in normal range and within 360 days    Creatinine, Ser  Date Value Ref Range Status  05/20/2024 1.31 (H) 0.76 - 1.27 mg/dL Final         Passed - AST in normal range and within 360 days    AST  Date Value Ref Range Status  12/31/2023 25 0 - 40 IU/L Final         Passed - ALT in normal range and within 360 days    ALT  Date Value Ref Range Status  12/31/2023 24 0 - 44 IU/L Final         Passed - Valid encounter within last 12 months    Recent Outpatient Visits           2 months ago Benign hypertension with chronic kidney disease, stage III [facility])   [facility] Glenard Mire, MD   7 months ago Claudication   [facility] Glenard Mire, MD   10 months ago Well adult exam   [facility] Glenard Mire, MD       Future Appointments             In 3 months Hester Alm BROCKS, MD [facility]

## 2025-01-20 ENCOUNTER — Encounter: Payer: Self-pay | Admitting: Family Medicine

## 2025-01-20 ENCOUNTER — Ambulatory Visit: Admitting: Family Medicine

## 2025-01-20 VITALS — BP 126/76 | HR 81 | Resp 16 | Ht 68.0 in | Wt 199.1 lb

## 2025-01-20 DIAGNOSIS — T466X5D Adverse effect of antihyperlipidemic and antiarteriosclerotic drugs, subsequent encounter: Secondary | ICD-10-CM

## 2025-01-20 DIAGNOSIS — N528 Other male erectile dysfunction: Secondary | ICD-10-CM

## 2025-01-20 DIAGNOSIS — I129 Hypertensive chronic kidney disease with stage 1 through stage 4 chronic kidney disease, or unspecified chronic kidney disease: Secondary | ICD-10-CM

## 2025-01-20 DIAGNOSIS — N1831 Chronic kidney disease, stage 3a: Secondary | ICD-10-CM

## 2025-01-20 DIAGNOSIS — M170 Bilateral primary osteoarthritis of knee: Secondary | ICD-10-CM

## 2025-01-20 DIAGNOSIS — R7989 Other specified abnormal findings of blood chemistry: Secondary | ICD-10-CM

## 2025-01-20 DIAGNOSIS — I2510 Atherosclerotic heart disease of native coronary artery without angina pectoris: Secondary | ICD-10-CM

## 2025-01-20 DIAGNOSIS — R739 Hyperglycemia, unspecified: Secondary | ICD-10-CM

## 2025-01-20 DIAGNOSIS — G720 Drug-induced myopathy: Secondary | ICD-10-CM

## 2025-01-20 DIAGNOSIS — M109 Gout, unspecified: Secondary | ICD-10-CM

## 2025-01-20 MED ORDER — OLMESARTAN MEDOXOMIL 40 MG PO TABS: 40.0000 mg | ORAL_TABLET | Freq: Every day | ORAL | 1 refills | Status: AC

## 2025-01-20 MED ORDER — AMLODIPINE BESYLATE 2.5 MG PO TABS: 2.5000 mg | ORAL_TABLET | Freq: Every day | ORAL | 1 refills | Status: AC

## 2025-01-20 NOTE — Progress Notes (Signed)
 Name: Fernando Hart   MRN: [id number]    DOB: 1951-06-21   Date:01/20/2025       Progress Note  Subjective  Chief Complaint  Chief Complaint  Patient presents with   Medical Management of Chronic Issues   Discussed the use of AI scribe software for clinical note transcription with the patient, who gave verbal consent to proceed.  History of Present Illness Fernando Hart is a 74 year old male with chronic kidney disease and hypertension who presents for a regular follow-up and blood work.  He has chronic kidney disease and his estimated glomerular filtration rate (eGFR) was last checked a year ago and has been in the 40s for a while according to prior labs. He is on olmesartan  40 mg and Farxiga  10 mg. He avoids NSAIDs like Aleve , drinks plenty of water, and uses Tylenol  for pain management.  He takes olmesartan  40 mg, metoprolol  50 mg, and amlodipine  for blood pressure. No dizziness upon standing, chest pain, or palpitations, but he notes dizziness when sitting down.  He has a history of heart disease managed with aspirin  and Nexlizet , which he takes three times a week to avoid muscle aches. 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  He takes Uloric  40 mg for gout. He occasionally uses Cialis  for erectile dysfunction but has not needed it recently.  He has prediabetes and reports frequent urination, especially at night, and drowsiness after consuming sweets. He manages symptoms by pairing sweets with protein.  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 His mother had a similar allergy.  He experiences swelling in his left ankle, more pronounced than the right, and uses compression stockings to manage it, though they can be uncomfortable if not applied early in the morning.    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   Coronary artery  disease of native artery of native heart with stable angina pectoris 05/18/2024   Statin myopathy 05/18/2024   Low vitamin B12 level 05/18/2024   Elevated uric acid in blood 05/18/2024   Primary osteoarthritis of both knees 05/18/2024   Claudication 05/18/2024   Abnormal cardiac CT angiography 12/06/2023   Stage 3a chronic kidney disease (HCC) 11/04/2020   Vitamin D  deficiency 11/04/2020   Intermittent low back pain 03/24/2018   Tinnitus of right ear 07/12/2017   Dyslipidemia 10/11/2016   Obesity 10/11/2016   Degenerative joint disease of right hip 02/28/2016   Pre-diabetes 01/10/2016   Controlled gout 01/10/2016   Essential hypertension 01/10/2016    Past Surgical History:  Procedure Laterality Date   LEFT HEART CATH AND CORONARY ANGIOGRAPHY Left 12/06/2023   Procedure: LEFT HEART CATH AND CORONARY ANGIOGRAPHY;  Surgeon: Mady Bruckner, MD;  Location: [facility];  Service: Cardiovascular;  Laterality: Left;   ROOT CANAL      Family History  Problem Relation Age of Onset   Hypertension Mother        Died in her late 53s   Diabetes Father        Died at 17   Diabetes Sister    GI Bleed Daughter     Social History   Tobacco Use   Smoking status: Former    Current packs/day: 0.00    Average packs/day: 1 pack/day for 35.0 years (35.0 ttl pk-yrs)    Types: Cigarettes    Start date: 07/1969  Quit date: 07/2004    Years since quitting: 20.5   Smokeless tobacco: Never  Substance Use Topics   Alcohol use: Not Currently    Alcohol/week: 2.0 standard drinks of alcohol    Types: 2 Cans of beer per week    Comment: not every week    Current Medications[1]  Allergies[2]  I personally reviewed active problem list, medication list, allergies, family history with the patient/caregiver today.   ROS  Ten systems reviewed and is negative except as mentioned in HPI    Objective Physical Exam CONSTITUTIONAL: Patient appears well-developed and well-nourished.  No distress. HEENT: Head atraumatic, normocephalic, neck supple. CARDIOVASCULAR: Normal rate, regular rhythm and normal heart sounds. No murmur heard. Trace pitting edema in left ankle. PULMONARY: Effort normal and breath sounds normal. Lungs clear to auscultation. No respiratory distress. ABDOMINAL: There is no tenderness or distention. MUSCULOSKELETAL: Normal gait. Without gross motor or sensory deficit. PSYCHIATRIC: Patient has a normal mood and affect. Behavior is normal. Judgment and thought content normal.  Vitals:   01/20/25 0923  BP: 126/76  Pulse: 81  Resp: 16  SpO2: 97%  Weight: 199 lb 1.6 oz (90.3 kg)  Height: 5' 8 (1.727 m)    Body mass index is 30.27 kg/m.  Recent Results (from the past 2160 hours)  Anatomic Pathology Report     Status: None   Collection Time: 10/28/24  3:48 PM  Result Value Ref Range   Diagnosis synopsis: Comment     Comment: Part 1-Right Cheek ,Skin Excision: FOLLICULAR CYST, INFUNDIBULAR TYPE (EPIDERMOID CYST).    Specimen: Comment     Comment: Part 1-Right Cheek ,Skin Excision   Signs/Symptoms: Comment     Comment: Part 1-Sq nodule 1.2cm   Clinical diagnosis: Comment     Comment: Part 1-Cyst vs other   Diagnosis: Comment     Comment: Part 1-FOLLICULAR CYST, INFUNDIBULAR TYPE (EPIDERMOID CYST).    Microscopic description: Comment     Comment: Part 1-The tissue demonstrates a dermal cyst lined by stratified squamous epithelium having a granular layer and loose keratin contents.    Gross description: Comment     Comment: Part 1-The specimen is received in formalin labeled [place], [place], Right Cheek. Received is a ellipse of skin measuring 0.8 x 0.5 x 1.1 cm. Margins are inked green. On cut section, a 1.3 x 1.0 x 0.6 cm cystic structure is identified. The specimen is sectioned and entirely submitted in cassettes 1-3.    Electronically signed by: Comment     Comment: Tonja Marine Oppenheim, MD, Pathologist   CPT code(s): Comment      Comment: [id number]   CPT Disclaimer: Comment     Comment: CPT codes, as published by the AMA, are provided for informational purpose only as the assignment of the CPT codes is the responsibility of the billing party.    Clinician provided ICD: Comment     Comment: D49.2   Pathologist provided ICD: Comment     Comment: Part 1-L72.0     PHQ2/9:    01/20/2025    9:22 AM 10/20/2024    8:37 AM 05/18/2024    8:57 AM 01/03/2024    8:19 AM 10/03/2023    8:34 AM  Depression screen PHQ 2/9  Decreased Interest 0 0 0 0 0  Down, Depressed, Hopeless 0 0 0 0 0  PHQ - 2 Score 0 0 0 0 0  Altered sleeping    0 0  Tired, decreased energy    0 0  Change in appetite    0 0  Feeling bad or failure about yourself     0 0  Trouble concentrating    0 0  Moving slowly or fidgety/restless    0 0  Suicidal thoughts    0 0  PHQ-9 Score    0  0   Difficult doing work/chores    Not difficult at all      Data saved with a previous flowsheet row definition    phq 9 is negative  Fall Risk:    01/20/2025    9:22 AM 10/20/2024    8:37 AM 05/18/2024    8:57 AM 01/03/2024    8:15 AM 10/03/2023    8:34 AM  Fall Risk   Falls in the past year? 0 0 0 0 0  Number falls in past yr: 0 0 0 0   Injury with Fall? 0 0  0  0    Risk for fall due to : No Fall Risks No Fall Risks No Fall Risks No Fall Risks No Fall Risks  Follow up Falls evaluation completed Falls evaluation completed Falls prevention discussed;Education provided;Falls evaluation completed Falls prevention discussed;Education provided;Falls evaluation completed Falls prevention discussed     Data saved with a previous flowsheet row definition      Assessment & Plan Hypertensive chronic kidney disease stage 3a Chronic kidney disease stage 3a with controlled hypertension. eGFR at 47. Medications include olmesartan  and Farxiga . - Ordered CBC, comprehensive metabolic panel, lipid panel, A1c, vitamin D , B12, and folate levels. - Continue olmesartan  40  mg daily. - Continue Farxiga  10 mg daily. - Advised to avoid NSAIDs. - Encouraged increased water intake. - Recommended Tylenol  for pain management.  Atherosclerotic heart disease of native coronary artery Atherosclerotic heart disease managed with aspirin  and Nexlizet . No chest pain or palpitations. - Continue aspirin  81 mg daily. - Continue Nexlizet  as prescribed.  Prediabetes with hypoglycemia Prediabetes with hypoglycemia. Advised on dietary modifications to manage blood sugar levels. - Ordered A1c test. - Advised dietary modifications including pairing sweets with protein and choosing lower sugar options.  Hyperlipidemia with statin-induced myopathy Hyperlipidemia managed with Nexlizet  due to statin-induced myopathy. - Continue Nexlizet  as prescribed.  Gout Gout well-controlled with Uloric . - Continue Uloric  40 mg daily.  Other male erectile dysfunction Erectile dysfunction managed with Cialis . Occasional use reported. - Continue Cialis  as needed.        [1]  Current Outpatient Medications:    amLODipine  (NORVASC ) 2.5 MG tablet, Take 1 tablet (2.5 mg total) by mouth daily., Disp: 90 tablet, Rfl: 1   aspirin  EC 81 MG tablet, Take 1 tablet (81 mg total) by mouth daily. Swallow whole., Disp: , Rfl:    Bempedoic Acid-Ezetimibe  (NEXLIZET ) 180-10 MG TABS, Take 1 tablet by mouth 3 (three) times a week., Disp: 48 tablet, Rfl: 0   Berberine Chloride 500 MG CAPS, Take 500 mg by mouth 2 (two) times daily., Disp: , Rfl:    Cholecalciferol (VITAMIN D3) 50 MCG (2000 UT) capsule, Take 1 capsule (2,000 Units total) by mouth daily., Disp: 100 capsule, Rfl: 1   colchicine  0.6 MG tablet, Take 1-2 tablets (0.6-1.2 mg total) by mouth daily as needed. For acute gout only, take 2 at once and may take one more 1 hour later if still in pain, Disp: 30 tablet, Rfl: 0   Cyanocobalamin 5000 MCG TBDP, Take 5,000 mcg by mouth daily., Disp: , Rfl:    dapagliflozin  propanediol (FARXIGA ) 10 MG TABS  tablet, Take  1 tablet (10 mg total) by mouth daily., Disp: 90 tablet, Rfl: 1   diclofenac  Sodium (VOLTAREN ) 1 % GEL, APPLY 2 GRAMS TOPICALLY TO AFFECTED AREA FOUR TIMES DAILY (Patient taking differently: Apply 2 g topically daily as needed (pain).), Disp: 300 g, Rfl: 1   docusate sodium (COLACE) 100 MG capsule, Take 100 mg by mouth daily., Disp: , Rfl:    EPINEPHrine  0.3 mg/0.3 mL IJ SOAJ injection, Inject 0.3 mg into the muscle as needed for anaphylaxis., Disp: 1 each, Rfl: 0   febuxostat  (ULORIC ) 40 MG tablet, TAKE 1 TABLET BY MOUTH DAILY FOR GOUT PREVENTION, Disp: 90 tablet, Rfl: 1   loratadine (ALAVERT) 10 MG dissolvable tablet, Take 10 mg by mouth daily as needed for allergies., Disp: , Rfl:    metoprolol  succinate (TOPROL -XL) 50 MG 24 hr tablet, Take 1 tablet (50 mg total) by mouth daily., Disp: 90 tablet, Rfl: 1   nitroGLYCERIN  (NITROSTAT ) 0.4 MG SL tablet, Place 1 tablet (0.4 mg total) under the tongue every 5 (five) minutes as needed for chest pain., Disp: 45 tablet, Rfl: 3   olmesartan  (BENICAR ) 40 MG tablet, Take 1 tablet (40 mg total) by mouth daily., Disp: 90 tablet, Rfl: 1   OVER THE COUNTER MEDICATION, Apply 1 application  topically daily as needed (Leg pain). Neuropathy Cream, Disp: , Rfl:    Polyvinyl Alcohol-Povidone PF (REFRESH) 1.4-0.6 % SOLN, Place 1 drop into both eyes daily as needed (Dry eye)., Disp: , Rfl:    tadalafil  (CIALIS ) 5 MG tablet, Take 1 tablet (5 mg total) by mouth daily., Disp: 90 tablet, Rfl: 1 [2]  Allergies Allergen Reactions   Allopurinol  Other (See Comments)    Chest pain, it has happened two separate times when he was given allopurinol     Shrimp (Diagnostic) Hives   Statins     Myalgias    Norvasc  [Amlodipine ] Swelling

## 2025-02-15 ENCOUNTER — Ambulatory Visit: Admitting: Podiatry

## 2025-03-18 ENCOUNTER — Ambulatory Visit: Admitting: Dermatology

## 2025-03-23 ENCOUNTER — Ambulatory Visit: Admitting: Family Medicine

## 2025-03-30 ENCOUNTER — Ambulatory Visit: Admitting: Dermatology
